# Patient Record
Sex: Female | Born: 1960 | Race: Asian | Hispanic: No | Marital: Married | Smoking: Former smoker
Health system: Southern US, Community
[De-identification: ages and names within clinical notes are randomized; demographics above are authoritative.]

## PROBLEM LIST (undated history)

## (undated) DIAGNOSIS — G8114 Spastic hemiplegia affecting left nondominant side: Secondary | ICD-10-CM

## (undated) DIAGNOSIS — D126 Benign neoplasm of colon, unspecified: Secondary | ICD-10-CM

## (undated) DIAGNOSIS — D509 Iron deficiency anemia, unspecified: Secondary | ICD-10-CM

## (undated) DIAGNOSIS — I10 Essential (primary) hypertension: Secondary | ICD-10-CM

## (undated) DIAGNOSIS — Z550 Illiteracy and low-level literacy: Secondary | ICD-10-CM

## (undated) DIAGNOSIS — Z87442 Personal history of urinary calculi: Secondary | ICD-10-CM

## (undated) DIAGNOSIS — D582 Other hemoglobinopathies: Secondary | ICD-10-CM

## (undated) DIAGNOSIS — I619 Nontraumatic intracerebral hemorrhage, unspecified: Secondary | ICD-10-CM

## (undated) DIAGNOSIS — Z86010 Personal history of colonic polyps: Principal | ICD-10-CM

## (undated) DIAGNOSIS — N200 Calculus of kidney: Secondary | ICD-10-CM

## (undated) DIAGNOSIS — I639 Cerebral infarction, unspecified: Secondary | ICD-10-CM

## (undated) HISTORY — DX: Calculus of kidney: N20.0

## (undated) HISTORY — DX: Iron deficiency anemia, unspecified: D50.9

## (undated) HISTORY — DX: Nontraumatic intracerebral hemorrhage, unspecified: I61.9

## (undated) HISTORY — DX: Cerebral infarction, unspecified: I63.9

## (undated) HISTORY — DX: Spastic hemiplegia affecting left nondominant side: G81.14

## (undated) HISTORY — DX: Essential (primary) hypertension: I10

## (undated) HISTORY — DX: Other hemoglobinopathies: D58.2

## (undated) HISTORY — DX: Personal history of colonic polyps: Z86.010

## (undated) HISTORY — DX: Benign neoplasm of colon, unspecified: D12.6

---

## 2004-11-20 ENCOUNTER — Emergency Department (HOSPITAL_COMMUNITY): Admission: EM | Admit: 2004-11-20 | Discharge: 2004-11-20 | Payer: Self-pay | Admitting: Emergency Medicine

## 2007-02-06 DIAGNOSIS — I619 Nontraumatic intracerebral hemorrhage, unspecified: Secondary | ICD-10-CM

## 2007-02-06 HISTORY — DX: Nontraumatic intracerebral hemorrhage, unspecified: I61.9

## 2007-04-20 ENCOUNTER — Inpatient Hospital Stay (HOSPITAL_COMMUNITY): Admission: EM | Admit: 2007-04-20 | Discharge: 2007-05-02 | Payer: Self-pay | Admitting: Emergency Medicine

## 2007-04-22 ENCOUNTER — Ambulatory Visit: Payer: Self-pay | Admitting: Physical Medicine & Rehabilitation

## 2010-06-20 NOTE — H&P (Signed)
NAMEMARIJAH, Strickland                   ACCOUNT NO.:  [id number]   MEDICAL RECORD NO.:  [id number]          PATIENT TYPE:  EMS   LOCATION:  MAJO                         FACILITY:  [facility]   PHYSICIAN:  Pramod P. Pearlean Brownie, MD    DATE OF BIRTH:  01-Oct-1960   DATE OF ADMISSION:  04/20/2007  DATE OF DISCHARGE:                              HISTORY & PHYSICAL   REASON FOR REFERRAL:  __________ stroke.   HISTORY OF PRESENT ILLNESS:  Ms. Strickland is a 50 year old [place] lady who developed sudden onset of headache with left leg  weakness at 11:30 a.m. today. The patient does not speak any English and  neither does her husband who is available at the bedsidewhen history was  obtained, after telephoning interpretation with Vietnamese-speaking  individual. The patient apparently was in a usual state of health until  this morning when her symptoms began suddenly. She has no prior history  of stroke, TIA, or significant known medical problems. The only  medications she takes are Aleve and Tylenol p.r.n. apparently for  headaches.   MEDICATION ALLERGIES:  None.   PAST MEDICAL HISTORY:  As mentioned above.   FAMILY HISTORY:  Not significant for anybody with strokes or other  neurological problems.   SOCIAL HISTORY:  The patient is married, lives with her husband. She  works full time. She is independent in activities of daily living. Does  not smoke or drink.   REVIEW OF SYSTEMS:  Not obtainable.   PHYSICAL EXAMINATION:  GENERAL:  Young Asian lady who is not in  distress. She is at present afebrile. Pulse rate is 100 per minute,  regular sinus.  Blood pressure 196/94. Respiratory rate 18 per minute.  Sats 96% on 2 liters.  HEAD:  Nontraumatic.  NECK:  Supple. There is no bruits.  ENT:  Unremarkable.  CARDIAC:  No murmur or gallop.  LUNGS:  Clear to auscultation.  ABDOMEN:  Soft, nontender.  NEUROLOGIC:  She is drowsy but does open eyes. She does follow commands.  She speaks in  [place] language. There is no slurred speech. She has  right gaze preference, cannot look to the left. She blinks to __________  on the right but not on the left. She has left lower facial weakness.  Tongue is midline. She has dense left hemiplegia with left upper  extremity being flaccid and 0/5 strength in the left lower extremity.  She does withdraw to some painful stimuli minimally. Both plantars are  downgoing.  Sensation and coordination cannot be tested.   DATA REVIEWED:  CT scan of the head noncontrast study done today reveals  4 x 3.2 x 3.2 cm right deep basal ganglia hemorrhage with slight right  to left midline shift. There is no intraventricular extension or edema  seen. Admission labs are pending at this time.   IMPRESSION:  A 50 year old lady with right deep basal ganglia  hemorrhage, likely due to hypertension.   PLAN:  The patient is admitted __________ hours and is at significant  risk for rebleed with hematoma expansion, edema, hydrocephalus,  intraventricular  extension. The patient may need mechanical ventilation  if she gets worse. I had a telephonic conversation with the patient's  husband by an interpreter explaining that she is critically ill and at  significant risk for worsening and is likely going to have major  disability from this hemorrhage. We will admit her to the intensive care  unit and monitor her blood pressures strictly and treat her with IV  labetalol p.r.n. and start Cardene drip and keep systolic blood pressure  120 to 160. Deep venous thrombosis and gastrointestinal prophylaxis.  Repeat CT scan of the head, noncontrast study, in six hours and MRI scan  tomorrow morning. I spent one hour of critical care time at the  patient's bedside as well as evaluating, treating, and monitoring her  care.           ______________________________  Sunny Schlein. Pearlean Brownie, MD     PPS/MEDQ  D:  04/20/2007  T:  04/20/2007  Job:  [id number]

## 2010-06-20 NOTE — Discharge Summary (Signed)
NAMEJIANNA, DRABIK                   ACCOUNT NO.:  [id number]   MEDICAL RECORD NO.:  [id number]          PATIENT TYPE:  INP   LOCATION:  3029                         FACILITY:  [facility]   PHYSICIAN:  Pramod P. Pearlean Brownie, MD    DATE OF BIRTH:  05-Jul-1960   DATE OF ADMISSION:  04/20/2007  DATE OF DISCHARGE:  05/01/2007                               DISCHARGE SUMMARY   DIAGNOSES AT THE TIME OF DISCHARGE:  1. Large right basal ganglia hypertensive hemorrhage.  2. Hypertension.   MEDICINES AT THE TIME OF DISCHARGE:  1. Hydrochlorothiazide 25 mg a day.  2. Altace 2.5 mg a day.  3. Protonix 40 mg a day.   STUDIES PERFORMED:  1. CT of the brain on admission showed a 4cm acute right basal ganglia      hemorrhage.  2. MRI of the brain showed large right basal ganglia hemorrhage and      associated surrounding edema and mass effect. This includes midline      shift to the left by 5.16mm and slightly low-lying cerebellar      tonsils. No thrombotic infarct. No intracranial mass seen.  3. MRA of the brain is motion degraded. A discrete vascular      malformation or aneurysm is not identified, though evaluation is      significantly limited.  4. Followup CT of the brain 4 hours later shows a slight interval      increase in the large acute hemorrhage in the right basal ganglia,      slight interval increase in right to left midline shift measuring      3mm compared to less than 1 on previous CT. No hydrocephalus.  5. Carotid Doppler not performed.  6. 2D echocardiogram not performed.  7. EKG not present in chart. Telemetry strip shows sinus rhythm.   LABORATORY STUDIES:  On admission, CBC with hemoglobin 12.5, hematocrit  39.6, white blood cells 11.8, and platelets 175. Chemistry with  potassium of 2.8, glucose 123. Liver function test normal.   HISTORY OF PRESENT ILLNESS:  Barbara Strickland is a 50 year old __________  lady  who developed sudden onset of headache with left leg weakness at 11:30  a.m. the  day of admission. The patient does not speak any English, and  neither does her husband. History was obtained after telephoning  interpretation with a Vietnamese-speaking individual. The patient was  apparently in her usual state of health until the morning of admission  when her symptoms began suddenly. She had no prior history of stroke,  TIA, or other medical problems. The only medicines she takes are Aleve  and Tylenol for headache. CT of the brain showed a 4x3.2x3.2 deep right  basal ganglia hemorrhage with slight right to left midline shift. She  was admitted to the ICU for further evaluation.   HOSPITAL COURSE:  Hemorrhage increased in size within the first 4 hours  without significant neurologic change. Hemorrhage was felt to be  secondary to hypertension for which she was started on  hydrochlorothiazide and Altace with a nice reduction in her blood  pressure. PT/OT and speech evaluated her and felt she would benefit from  inpatient rehab. The patient does not have any insurance benefits, and  therefore is unable to make that transfer. __________  Nursing facility  was sought out and now the patient is medically stable and ready for  discharge.   CONDITION AT DISCHARGE:  Patient awake, alert, follows simple commands  by gesture as she does not speak [place]. She has a dense left  hemiparesis 0/5 in her arm and leg. She has good strength on her right.  She has decreased sensation on her left. Her heart rate is regular. Her  breath sounds are clear.   DISCHARGE PLAN:  1. Discharged to skilled nursing facility for continuation of PT/OT      and speech therapy.  2. No antiplatelets at this time. Consider aspirin 325 mg a day 4      weeks from time of stroke.  3. Follow up with the physician  in nursing home.  4. Follow up Dr. Delia Heady in 2-3 months.      Annie Main, N.P.    ______________________________  Sunny Schlein. Pearlean Brownie, MD    SB/MEDQ  D:  05/01/2007  T:   05/01/2007  Job:  [id number]   cc:   Pramod P. Pearlean Brownie, MD  Facility for discharge

## 2010-10-30 LAB — DIFFERENTIAL
Eosinophils Relative: 3
Lymphocytes Relative: 16
Lymphs Abs: 1.8
Monocytes Absolute: 0.7
Monocytes Relative: 6

## 2010-10-30 LAB — COMPREHENSIVE METABOLIC PANEL
AST: 25
Albumin: 4
Calcium: 8.4
Chloride: 105
Creatinine, Ser: 0.93
GFR calc Af Amer: >60

## 2010-10-30 LAB — CBC
HCT: 39.6
Platelets: 175
WBC: 11.8 — ABNORMAL HIGH

## 2010-10-30 LAB — CK TOTAL AND CKMB (NOT AT ARMC)
CK, MB: 0.7
Relative Index: INVALID

## 2010-10-30 LAB — TROPONIN I: Troponin I: 0.01

## 2011-02-28 ENCOUNTER — Ambulatory Visit: Payer: Self-pay

## 2011-02-28 DIAGNOSIS — I10 Essential (primary) hypertension: Secondary | ICD-10-CM

## 2012-09-30 ENCOUNTER — Other Ambulatory Visit: Payer: Self-pay | Admitting: Family Medicine

## 2015-06-21 ENCOUNTER — Ambulatory Visit (INDEPENDENT_AMBULATORY_CARE_PROVIDER_SITE_OTHER): Payer: BLUE CROSS/BLUE SHIELD

## 2015-06-21 ENCOUNTER — Ambulatory Visit (INDEPENDENT_AMBULATORY_CARE_PROVIDER_SITE_OTHER): Payer: BLUE CROSS/BLUE SHIELD | Admitting: Emergency Medicine

## 2015-06-21 VITALS — BP 188/116 | HR 61 | Temp 98.1°F | Resp 16 | Ht 59.75 in | Wt 128.4 lb

## 2015-06-21 DIAGNOSIS — D509 Iron deficiency anemia, unspecified: Secondary | ICD-10-CM | POA: Diagnosis not present

## 2015-06-21 DIAGNOSIS — Z91199 Patient's noncompliance with other medical treatment and regimen due to unspecified reason: Secondary | ICD-10-CM | POA: Insufficient documentation

## 2015-06-21 DIAGNOSIS — Z9119 Patient's noncompliance with other medical treatment and regimen: Secondary | ICD-10-CM

## 2015-06-21 DIAGNOSIS — I619 Nontraumatic intracerebral hemorrhage, unspecified: Secondary | ICD-10-CM | POA: Insufficient documentation

## 2015-06-21 DIAGNOSIS — I10 Essential (primary) hypertension: Secondary | ICD-10-CM | POA: Insufficient documentation

## 2015-06-21 DIAGNOSIS — M25561 Pain in right knee: Secondary | ICD-10-CM

## 2015-06-21 DIAGNOSIS — G8114 Spastic hemiplegia affecting left nondominant side: Secondary | ICD-10-CM | POA: Diagnosis not present

## 2015-06-21 LAB — COMPLETE METABOLIC PANEL WITH GFR
ALT: 13 U/L (ref 6–29)
AST: 18 U/L (ref 10–35)
Albumin: 4 g/dL (ref 3.6–5.1)
Alkaline Phosphatase: 37 U/L (ref 33–130)
BUN: 18 mg/dL (ref 7–25)
CALCIUM: 8.4 mg/dL — AB (ref 8.6–10.4)
CHLORIDE: 107 mmol/L (ref 98–110)
CO2: 24 mmol/L (ref 20–31)
Creat: 0.83 mg/dL (ref 0.50–1.05)
GFR, Est Non African American: 79 mL/min (ref >=60)
Glucose, Bld: 95 mg/dL (ref 65–99)
POTASSIUM: 3.8 mmol/L (ref 3.5–5.3)
Sodium: 142 mmol/L (ref 135–146)
Total Bilirubin: 0.8 mg/dL (ref 0.2–1.2)
Total Protein: 7.3 g/dL (ref 6.1–8.1)

## 2015-06-21 LAB — POCT CBC
GRANULOCYTE PERCENT: 62.1 % (ref 37–80)
HEMATOCRIT: 36.9 % — AB (ref 37.7–47.9)
Hemoglobin: 12.2 g/dL (ref 12.2–16.2)
LYMPH, POC: 2.3 (ref 0.6–3.4)
MCH: 21.3 pg — AB (ref 27–31.2)
MCHC: 33.1 g/dL (ref 31.8–35.4)
MCV: 64.3 fL — AB (ref 80–97)
MID (cbc): 0.5 (ref 0–0.9)
MPV: 9 fL (ref 0–99.8)
POC Granulocyte: 4.5 (ref 2–6.9)
POC LYMPH PERCENT: 31.6 %L (ref 10–50)
POC MID %: 6.3 % (ref 0–12)
Platelet Count, POC: 131 10*3/uL — AB (ref 142–424)
RBC: 5.74 M/uL — AB (ref 4.04–5.48)
RDW, POC: 15.4 %
WBC: 7.3 10*3/uL (ref 4.6–10.2)

## 2015-06-21 LAB — IRON AND TIBC
%SAT: 11 % (ref 11–50)
IRON: 31 ug/dL — AB (ref 45–160)
TIBC: 285 ug/dL (ref 250–450)
UIBC: 254 ug/dL (ref 125–400)

## 2015-06-21 LAB — LIPID PANEL
CHOL/HDL RATIO: 2.1 ratio (ref <=5.0)
CHOLESTEROL: 235 mg/dL — AB (ref 125–200)
HDL: 114 mg/dL (ref >=46)
LDL CALC: 106 mg/dL (ref <130)
TRIGLYCERIDES: 73 mg/dL (ref <150)
VLDL: 15 mg/dL (ref <30)

## 2015-06-21 LAB — TSH: TSH: 1.17 m[IU]/L

## 2015-06-21 LAB — FERRITIN: Ferritin: 80 ng/mL (ref 10–232)

## 2015-06-21 LAB — POCT GLYCOSYLATED HEMOGLOBIN (HGB A1C): Hemoglobin A1C: 5.5

## 2015-06-21 MED ORDER — LOSARTAN POTASSIUM 25 MG PO TABS: 50.0000 mg | ORAL_TABLET | Freq: Every day | ORAL | Status: DC

## 2015-06-21 MED ORDER — AMLODIPINE BESYLATE 5 MG PO TABS: 5.0000 mg | ORAL_TABLET | Freq: Every day | ORAL | Status: DC

## 2015-06-21 NOTE — Patient Instructions (Addendum)
You can take Tylenol for your knee plain. You can apply an ice pack to use 5-10 minutes twice a day. Please get your blood pressure medications filled. I have made an appointment for you to see the neurologist and they will call you with the appointment time Please return to clinic in 7-10 days for repeat blood pressure check.DASH Eating Plan DASH stands for "Dietary Approaches to Stop Hypertension." The DASH eating plan is a healthy eating plan that has been shown to reduce high blood pressure (hypertension). Additional health benefits may include reducing the risk of type 2 diabetes mellitus, heart disease, and stroke. The DASH eating plan may also help with weight loss. WHAT DO I NEED TO KNOW ABOUT THE DASH EATING PLAN? For the DASH eating plan, you will follow these general guidelines:  Choose foods with a percent daily value for sodium of less than 5% (as listed on the food label).  Use salt-free seasonings or herbs instead of table salt or sea salt.  Check with your health care provider or pharmacist before using salt substitutes.  Eat lower-sodium products, often labeled as "lower sodium" or "no salt added."  Eat fresh foods.  Eat more vegetables, fruits, and low-fat dairy products.  Choose whole grains. Look for the word "whole" as the first word in the ingredient list.  Choose fish and skinless chicken or [place] more often than red meat. Limit fish, poultry, and meat to 6 oz (170 g) each day.  Limit sweets, desserts, sugars, and sugary drinks.  Choose heart-healthy fats.  Limit cheese to 1 oz (28 g) per day.  Eat more home-cooked food and less restaurant, buffet, and fast food.  Limit fried foods.  Cook foods using methods other than frying.  Limit canned vegetables. If you do use them, rinse them well to decrease the sodium.  When eating at a restaurant, ask that your food be prepared with less salt, or no salt if possible. WHAT FOODS CAN I EAT? Seek help from a  dietitian for individual calorie needs. Grains Whole grain or whole wheat bread. Brown rice. Whole grain or whole wheat pasta. Quinoa, bulgur, and whole grain cereals. Low-sodium cereals. Corn or whole wheat flour tortillas. Whole grain cornbread. Whole grain crackers. Low-sodium crackers. Vegetables Fresh or frozen vegetables (raw, steamed, roasted, or grilled). Low-sodium or reduced-sodium tomato and vegetable juices. Low-sodium or reduced-sodium tomato sauce and paste. Low-sodium or reduced-sodium canned vegetables.  Fruits All fresh, canned (in natural juice), or frozen fruits. Meat and Other Protein Products Ground beef (85% or leaner), grass-fed beef, or beef trimmed of fat. Skinless chicken or [place]. Ground chicken or [place]. Pork trimmed of fat. All fish and seafood. Eggs. Dried beans, peas, or lentils. Unsalted nuts and seeds. Unsalted canned beans. Dairy Low-fat dairy products, such as skim or 1% milk, 2% or reduced-fat cheeses, low-fat ricotta or cottage cheese, or plain low-fat yogurt. Low-sodium or reduced-sodium cheeses. Fats and Oils Tub margarines without trans fats. Light or reduced-fat mayonnaise and salad dressings (reduced sodium). Avocado. Safflower, olive, or canola oils. Natural peanut or almond butter. Other Unsalted popcorn and pretzels. The items listed above may not be a complete list of recommended foods or beverages. Contact your dietitian for more options. WHAT FOODS ARE NOT RECOMMENDED? Grains White bread. White pasta. White rice. Refined cornbread. Bagels and croissants. Crackers that contain trans fat. Vegetables Creamed or fried vegetables. Vegetables in a cheese sauce. Regular canned vegetables. Regular canned tomato sauce and paste. Regular tomato and vegetable juices. Fruits  Dried fruits. Canned fruit in light or heavy syrup. Fruit juice. Meat and Other Protein Products Fatty cuts of meat. Ribs, chicken wings, bacon, sausage, bologna, salami,  chitterlings, fatback, hot dogs, bratwurst, and packaged luncheon meats. Salted nuts and seeds. Canned beans with salt. Dairy Whole or 2% milk, cream, half-and-half, and cream cheese. Whole-fat or sweetened yogurt. Full-fat cheeses or blue cheese. Nondairy creamers and whipped toppings. Processed cheese, cheese spreads, or cheese curds. Condiments Onion and garlic salt, seasoned salt, table salt, and sea salt. Canned and packaged gravies. Worcestershire sauce. Tartar sauce. Barbecue sauce. Teriyaki sauce. Soy sauce, including reduced sodium. Steak sauce. Fish sauce. Oyster sauce. Cocktail sauce. Horseradish. Ketchup and mustard. Meat flavorings and tenderizers. Bouillon cubes. Hot sauce. Tabasco sauce. Marinades. Taco seasonings. Relishes. Fats and Oils Butter, stick margarine, lard, shortening, ghee, and bacon fat. Coconut, palm kernel, or palm oils. Regular salad dressings. Other Pickles and olives. Salted popcorn and pretzels. The items listed above may not be a complete list of foods and beverages to avoid. Contact your dietitian for more information. WHERE CAN I FIND MORE INFORMATION? National Heart, Lung, and Blood Institute: travelstabloid.com   This information is not intended to replace advice given to you by your health care provider. Make sure you discuss any questions you have with your health care provider.   Document Released: 01/11/2011 Document Revised: 02/12/2014 Document Reviewed: 11/26/2012 Elsevier Interactive Patient Education 2016 [employer]. .    IF you received an x-ray today, you will receive an invoice from [facility] Radiology. Please contact [facility] at [phone] with questions or concerns regarding your invoice.   IF you received labwork today, you will receive an invoice from [employer]. Please contact [facility] at [phone] with questions or concerns regarding your invoice.   Our  billing staff will not be able to assist you with questions regarding bills from these companies.  You will be contacted with the lab results as soon as they are available. The fastest way to get your results is to activate your My Chart account. Instructions are located on the last page of this paperwork. If you have not heard from [place] regarding the results in 2 weeks, please contact this office.

## 2015-06-21 NOTE — Progress Notes (Addendum)
Subjective:  This chart was scribed for Barbara Queen MD, by [name], at [facility].  This patient was seen in room 1 and the patient's care was started at 8:29 AM.   Chief Complaint  Patient presents with  . Knee Pain    right x 3 days     Patient ID: Barbara Strickland, female    DOB: 01/28/1961, 55 y.o.   MRN: [id number]  HPI HPI Comments: Barbara Strickland is a 55 y.o. female who presents to the [facility] complaining of right knee pain after falling three days age while getting out of the shower.  She is unable to straighten her leg fully and her daughter has been messaging it since the incident.   Patient has associated symptoms of back pain.  Per daughter, the floor was slippery and thinks this is why she fell. Denies a headache or chest pain.   Patient had a stroke in 2009 (acute hemorrhage in her right basal ganglia) and her daughter states that the left side of her body has been numb since then. She has not yet seen a doctor regarding her stroke.  She stayed in a nursing home for a week afterwards but never went to a doctor for a  follow up.  Patient does not take any blood thinners. She is willing to see a doctor regarding her stroke once a referral is made.    Blood Pressure: Patient's daughter states that her mother has always had high blood pressure. She "thinks" that she was given blood pressure medication in 2009 or 2011, but is unsure. She is not currently on any medication.  Patient is from [place] and her daughter is interpreting for her today.   There are no active problems to display for this patient.  No past medical history on file. No past surgical history on file. No Known Allergies Prior to Admission medications   Not on File   Social History   Social History  . Marital Status: Married    Spouse Name: N/A  . Number of Children: N/A  . Years of Education: N/A   Occupational History  . Not on file.   Social History  Main Topics  . Smoking status: Former Smoker -- 3 years    Types: Cigarettes  . Smokeless tobacco: Not on file  . Alcohol Use: No  . Drug Use: Not on file  . Sexual Activity: Not on file   Other Topics Concern  . Not on file   Social History Narrative  . No narrative on file       Review of Systems  Constitutional: Negative for fever and chills.  Eyes: Negative for pain, redness and itching.  Respiratory: Negative for cough, choking and shortness of breath.   Cardiovascular: Negative for chest pain.  Gastrointestinal: Negative for nausea and vomiting.  Musculoskeletal: Positive for back pain and gait problem. Negative for neck pain and neck stiffness.  Neurological: Positive for numbness. Negative for syncope, speech difficulty and headaches.       Objective:   Physical Exam Filed Vitals:   06/21/15 0819 06/21/15 0827  BP: 200/120 188/116  Pulse: 61   Temp: 98.1 F (36.7 C)   TempSrc: Oral   Resp: 16   Height: 4' 11.75" (1.518 m)   Weight: 128 lb 6.4 oz (58.242 kg)   SpO2: 97%     CONSTITUTIONAL: Well developed/well nourished HEAD: A left facial paralysis EYES: EOMI/PERRL ENMT: Mucous membranes moist  NECK: supple no meningeal signs CV: S1/S2 noted, no murmurs/rubs/gallops noted LUNGS: Lungs are clear to auscultation bilaterally, no apparent distress NEURO: Pt is awake/alert/appropriate, moves all extremitiesx4.  No facial droop.   EXTREMITIES: Spastic left arm, Weakness on the left leg, Tenderness around the right knee with decreased range of motion.   Dg Knee Complete 4 Views Right  06/21/2015  CLINICAL DATA:  Acute pain.  Contusion. EXAM: RIGHT KNEE - COMPLETE 4+ VIEW COMPARISON:  No prior. FINDINGS: No acute bony abnormality identified. No evidence of fracture dislocation. Small knee joint effusion. IMPRESSION: 1. No acute bony abnormality. 2. Small knee joint effusion. Electronically Signed   By: Marcello Moores  Register   On: 06/21/2015 09:32   Results for orders  placed or performed in visit on 06/21/15  POCT CBC  Result Value Ref Range   WBC 7.3 4.6 - 10.2 K/uL   Lymph, poc 2.3 0.6 - 3.4   POC LYMPH PERCENT 31.6 10 - 50 %L   MID (cbc) 0.5 0 - 0.9   POC MID % 6.3 0 - 12 %M   POC Granulocyte 4.5 2 - 6.9   Granulocyte percent 62.1 37 - 80 %G   RBC 5.74 (A) 4.04 - 5.48 M/uL   Hemoglobin 12.2 12.2 - 16.2 g/dL   HCT, POC 36.9 (A) 37.7 - 47.9 %   MCV 64.3 (A) 80 - 97 fL   MCH, POC 21.3 (A) 27 - 31.2 pg   MCHC 33.1 31.8 - 35.4 g/dL   RDW, POC 15.4 %   Platelet Count, POC 131 (A) 142 - 424 K/uL   MPV 9.0 0 - 99.8 fL  POCT glycosylated hemoglobin (Hb A1C)  Result Value Ref Range   Hemoglobin A1C 5.5        Assessment & Plan:  Patient will start on amlodipine 5 mg. She was also given losartan 25 mg.She has not seen a doctor since 2011 when she was under treatment for hemorrhagic stroke in the basal ganglia. She has not been on any medication or had any follow-up since that admission. She will need blood pressure recheck in one week. Referral made to Dr. Leonie Man for follow-up of her stroke. 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 She will be instructed to take Tylenol for pain.I personally performed the services described in this documentation, which was scribed in my presence. The recorded information has been reviewed and is accurate.  Darlyne Russian, MD

## 2015-06-22 ENCOUNTER — Encounter: Payer: Self-pay | Admitting: Internal Medicine

## 2015-06-22 ENCOUNTER — Other Ambulatory Visit: Payer: Self-pay | Admitting: Emergency Medicine

## 2015-06-22 DIAGNOSIS — D509 Iron deficiency anemia, unspecified: Secondary | ICD-10-CM

## 2015-06-30 ENCOUNTER — Encounter: Payer: Self-pay | Admitting: *Deleted

## 2015-07-07 ENCOUNTER — Telehealth: Payer: Self-pay | Admitting: *Deleted

## 2015-07-07 ENCOUNTER — Ambulatory Visit: Payer: BLUE CROSS/BLUE SHIELD | Admitting: Neurology

## 2015-07-07 NOTE — Telephone Encounter (Signed)
no showed NP appt 

## 2015-07-08 ENCOUNTER — Encounter: Payer: Self-pay | Admitting: Neurology

## 2015-08-23 ENCOUNTER — Ambulatory Visit (INDEPENDENT_AMBULATORY_CARE_PROVIDER_SITE_OTHER): Payer: BLUE CROSS/BLUE SHIELD | Admitting: Internal Medicine

## 2015-08-23 ENCOUNTER — Encounter: Payer: Self-pay | Admitting: Internal Medicine

## 2015-08-23 VITALS — BP 174/110 | HR 60 | Ht 58.25 in | Wt 127.0 lb

## 2015-08-23 DIAGNOSIS — Z9119 Patient's noncompliance with other medical treatment and regimen: Secondary | ICD-10-CM | POA: Diagnosis not present

## 2015-08-23 DIAGNOSIS — I10 Essential (primary) hypertension: Secondary | ICD-10-CM | POA: Diagnosis not present

## 2015-08-23 DIAGNOSIS — R718 Other abnormality of red blood cells: Secondary | ICD-10-CM

## 2015-08-23 DIAGNOSIS — Z91199 Patient's noncompliance with other medical treatment and regimen due to unspecified reason: Secondary | ICD-10-CM

## 2015-08-23 DIAGNOSIS — Z1211 Encounter for screening for malignant neoplasm of colon: Secondary | ICD-10-CM | POA: Diagnosis not present
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
## 2015-08-23 NOTE — Patient Instructions (Addendum)
   Please get the blood pressure medications and restart today. You are at risk of another stroke, heart attacks and death if you do not take them.  You may call back to schedule a screening colonoscopy.  I appreciate the opportunity to care for you. Gatha Mayer, MD, [facility]   Colorectal Cancer Screening Colorectal cancer screening is a group of tests used to check for colorectal cancer. Colorectal refers to your colon and rectum. Your colon and rectum are located at the end of your large intestine and carry your bowel movements out of your body.  WHY IS COLORECTAL CANCER SCREENING DONE? It is common for abnormal growths (polyps) to form in the lining of your colon, especially as you get older. These polyps can be cancerous or become cancerous. If colorectal cancer is found at an early stage, it is treatable. WHO SHOULD BE SCREENED FOR COLORECTAL CANCER? Screening is recommended for all adults at average risk starting at age 42. Tests may be recommended every 1 to 10 years. Your health care provider may recommend earlier or more frequent screening if you have:  A history of colorectal cancer or polyps.  A family member with a history of colorectal cancer or polyps.  Inflammatory bowel disease, such as ulcerative colitis or Crohn disease.  A type of hereditary colon cancer syndrome.  Colorectal cancer symptoms. TYPES OF SCREENING TESTS There are several types of colorectal screening tests. They include:  Guaiac-based fecal occult blood testing.  Fecal immunochemical test (FIT).  Stool DNA test.  Barium enema.  Virtual colonoscopy.  Sigmoidoscopy. During this test, a sigmoidoscope is used to examine your rectum and lower colon. A sigmoidoscope is a flexible tube with a camera that is inserted through your anus into your rectum and lower colon.  Colonoscopy. During this test, a colonoscope is used to examine your entire colon. A colonoscope is a long, thin, flexible tube  with a camera. This test examines your entire colon and rectum.   This information is not intended to replace advice given to you by your health care provider. Make sure you discuss any questions you have with your health care provider.   Document Released: 07/12/2009 Document Revised: 02/12/2014 Document Reviewed: 04/30/2013 Elsevier Interactive Patient Education [employer].

## 2016-05-15 ENCOUNTER — Ambulatory Visit (INDEPENDENT_AMBULATORY_CARE_PROVIDER_SITE_OTHER): Payer: BLUE CROSS/BLUE SHIELD | Admitting: Physician Assistant

## 2016-05-15 VITALS — BP 140/100 | HR 61 | Temp 97.4°F | Resp 17 | Ht 59.5 in | Wt 126.0 lb

## 2016-05-15 DIAGNOSIS — M7989 Other specified soft tissue disorders: Secondary | ICD-10-CM | POA: Diagnosis not present

## 2016-05-15 NOTE — Progress Notes (Signed)
PRIMARY CARE AT [place], [place] [address] [phone]  Date:  05/15/2016   Name:  Barbara Strickland   DOB:  26-Jan-1961   MRN:  [id number]  PCP:  No PCP Per Patient    History of Present Illness:  Barbara Strickland is a 56 y.o. female patient who presents to PCP with  Chief Complaint  Patient presents with  . fell and has bump on head     Patient is here today for cc of bump on head.  Patient is here with individual to intepret [place].   She reports that she is concerned of swelling that started in head 2008.  She fell secondary to a hemorrhagic stroke.  A bump grew on head and has been prominent since that time.  They are concerned of infection because it is there.  The bump has not grown for years.  No tenderness.  No redness, no fever.  No drainage.    Patient Active Problem List   Diagnosis Date Noted  . 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 Essential hypertension 06/21/2015  . Medical non-compliance 06/21/2015    Past Medical History:  Diagnosis Date  . 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 Hemorrhagic stroke ([place])   . Hypertension   . Left spastic hemiparesis ([place])   . Microcytic anemia     No past surgical history on file.  Social History  Substance Use Topics  . Smoking status: Former Smoker    Years: 3.00    Types: Cigarettes    Quit date: 02/06/2003  . Smokeless tobacco: Never Used  . Alcohol use No    Family History  Problem Relation Age of Onset  . Family history unknown: Yes    No Known Allergies  Medication list has been reviewed and updated.  Current Outpatient Prescriptions on File Prior to Visit  Medication Sig Dispense Refill  . amLODipine (NORVASC) 5 MG tablet Take 1 tablet (5 mg total) by mouth daily. 30 tablet 11  . losartan (COZAAR) 25 MG tablet Take 2 tablets (50 mg total) by mouth daily. 30 tablet 11   No current facility-administered medications on file prior to visit.     ROS ROS otherwise unremarkable unless listed  above.  Physical Examination: BP (!) 174/99   Pulse 61   Temp 97.4 F (36.3 C) (Oral)   Resp 17   Ht 4' 11.5" (1.511 m)   Wt 126 lb (57.2 kg)   SpO2 97%   BMI 25.02 kg/m  Ideal Body Weight: Weight in (lb) to have BMI = 25: 125.6  Physical Exam  Constitutional: She is oriented to person, place, and time. She appears well-developed and well-nourished. No distress.  HENT:  Head: Normocephalic and atraumatic.  Right Ear: External ear normal.  Left Ear: External ear normal.  Eyes: Conjunctivae and EOM are normal. Pupils are equal, round, and reactive to light.  Cardiovascular: Normal rate and regular rhythm.   Pulmonary/Chest: Effort normal. No respiratory distress.  Neurological: She is alert and oriented to person, place, and time.  Skin: She is not diaphoretic.  Right rubbery mobile nodule at the right side of scalp.  Non-tender.  No erythema, driange or scabbing.     Psychiatric: She has a normal mood and affect. Her behavior is normal.     Assessment and Plan: Barbara Strickland is a 56 y.o. female who is here today for cc of rubbery nodule at the scalp Advised that this is benign.  Very cyst like.  Recommended imaging.  It would likely be an ultrasound. Nodule of soft tissue  Barbara Drape, PA-C [facility] 4/10/201810:39 AM

## 2016-05-15 NOTE — Patient Instructions (Signed)
     IF you received an x-ray today, you will receive an invoice from [facility]. Please contact [facility] at [phone] with questions or concerns regarding your invoice.   IF you received labwork today, you will receive an invoice from [facility]. Please contact [facility] at [phone] with questions or concerns regarding your invoice.   Our billing staff will not be able to assist you with questions regarding bills from these companies.  You will be contacted with the lab results as soon as they are available. The fastest way to get your results is to activate your My Chart account. Instructions are located on the last page of this paperwork. If you have not heard from us regarding the results in 2 weeks, please contact this office.     

## 2016-07-22 ENCOUNTER — Other Ambulatory Visit: Payer: Self-pay | Admitting: Emergency Medicine

## 2016-07-22 DIAGNOSIS — I10 Essential (primary) hypertension: Secondary | ICD-10-CM

## 2016-08-01 ENCOUNTER — Encounter: Payer: Self-pay | Admitting: Physician Assistant

## 2016-08-01 ENCOUNTER — Ambulatory Visit (INDEPENDENT_AMBULATORY_CARE_PROVIDER_SITE_OTHER): Payer: BLUE CROSS/BLUE SHIELD

## 2016-08-01 ENCOUNTER — Ambulatory Visit (INDEPENDENT_AMBULATORY_CARE_PROVIDER_SITE_OTHER): Payer: BLUE CROSS/BLUE SHIELD | Admitting: Physician Assistant

## 2016-08-01 VITALS — BP 162/93 | HR 60 | Temp 98.2°F | Resp 16 | Ht 59.5 in | Wt 127.2 lb

## 2016-08-01 DIAGNOSIS — E7800 Pure hypercholesterolemia, unspecified: Secondary | ICD-10-CM

## 2016-08-01 DIAGNOSIS — R109 Unspecified abdominal pain: Secondary | ICD-10-CM

## 2016-08-01 DIAGNOSIS — D509 Iron deficiency anemia, unspecified: Secondary | ICD-10-CM | POA: Diagnosis not present

## 2016-08-01 DIAGNOSIS — K5900 Constipation, unspecified: Secondary | ICD-10-CM

## 2016-08-01 DIAGNOSIS — Z1231 Encounter for screening mammogram for malignant neoplasm of breast: Secondary | ICD-10-CM | POA: Diagnosis not present

## 2016-08-01 DIAGNOSIS — N200 Calculus of kidney: Secondary | ICD-10-CM

## 2016-08-01 DIAGNOSIS — I10 Essential (primary) hypertension: Secondary | ICD-10-CM

## 2016-08-01 DIAGNOSIS — Z1211 Encounter for screening for malignant neoplasm of colon: Secondary | ICD-10-CM

## 2016-08-01 LAB — CMP14+EGFR
ALBUMIN: 4.1 g/dL (ref 3.5–5.5)
ALT: 16 IU/L (ref 0–32)
AST: 22 IU/L (ref 0–40)
Albumin/Globulin Ratio: 1.4 (ref 1.2–2.2)
Alkaline Phosphatase: 50 IU/L (ref 39–117)
BILIRUBIN TOTAL: 0.6 mg/dL (ref 0.0–1.2)
BUN / CREAT RATIO: 19 (ref 9–23)
BUN: 19 mg/dL (ref 6–24)
CALCIUM: 8.9 mg/dL (ref 8.7–10.2)
CHLORIDE: 106 mmol/L (ref 96–106)
CO2: 23 mmol/L (ref 20–29)
CREATININE: 0.99 mg/dL (ref 0.57–1.00)
GFR calc non Af Amer: 63 mL/min/{1.73_m2} (ref >59)
GFR, EST AFRICAN AMERICAN: 73 mL/min/{1.73_m2} (ref >59)
GLUCOSE: 82 mg/dL (ref 65–99)
Globulin, Total: 3 g/dL (ref 1.5–4.5)
Potassium: 4 mmol/L (ref 3.5–5.2)
Sodium: 144 mmol/L (ref 134–144)
TOTAL PROTEIN: 7.1 g/dL (ref 6.0–8.5)

## 2016-08-01 LAB — CBC WITH DIFFERENTIAL/PLATELET
BASOS: 1 %
Basophils Absolute: 0 10*3/uL (ref 0.0–0.2)
EOS (ABSOLUTE): 0.1 10*3/uL (ref 0.0–0.4)
EOS: 2 %
HEMATOCRIT: 35.7 % (ref 34.0–46.6)
Hemoglobin: 11.2 g/dL (ref 11.1–15.9)
Immature Grans (Abs): 0 10*3/uL (ref 0.0–0.1)
Immature Granulocytes: 0 %
LYMPHS ABS: 2.2 10*3/uL (ref 0.7–3.1)
Lymphs: 38 %
MCH: 20.9 pg — ABNORMAL LOW (ref 26.6–33.0)
MCHC: 31.4 g/dL — AB (ref 31.5–35.7)
MCV: 67 fL — AB (ref 79–97)
MONOS ABS: 0.3 10*3/uL (ref 0.1–0.9)
Monocytes: 6 %
Neutrophils Absolute: 3 10*3/uL (ref 1.4–7.0)
Neutrophils: 53 %
PLATELETS: 170 10*3/uL (ref 150–379)
RBC: 5.35 x10E6/uL — AB (ref 3.77–5.28)
RDW: 17.1 % — AB (ref 12.3–15.4)
WBC: 5.6 10*3/uL (ref 3.4–10.8)

## 2016-08-01 LAB — LIPID PANEL
Chol/HDL Ratio: 2.5 ratio (ref 0.0–4.4)
Cholesterol, Total: 255 mg/dL — ABNORMAL HIGH (ref 100–199)
HDL: 102 mg/dL (ref >39)
LDL CALC: 139 mg/dL — AB (ref 0–99)
Triglycerides: 68 mg/dL (ref 0–149)
VLDL CHOLESTEROL CAL: 14 mg/dL (ref 5–40)

## 2016-08-01 LAB — POC HEMOCCULT BLD/STL (OFFICE/1-CARD/DIAGNOSTIC): Fecal Occult Blood, POC: NEGATIVE

## 2016-08-01 MED ORDER — AMLODIPINE BESYLATE 5 MG PO TABS: 5.0000 mg | ORAL_TABLET | Freq: Every day | ORAL | 1 refills | Status: DC

## 2016-08-01 MED ORDER — POLYETHYLENE GLYCOL 3350 17 GM/SCOOP PO POWD: 17.0000 g | Freq: Every day | ORAL | 1 refills | Status: DC

## 2016-08-01 MED ORDER — DOCUSATE SODIUM 100 MG PO CAPS
300.0000 mg | ORAL_CAPSULE | Freq: Every day | ORAL | 0 refills | Status: DC
Start: 2016-08-01 — End: 2017-07-10

## 2016-08-01 MED ORDER — LOSARTAN POTASSIUM 50 MG PO TABS: 50.0000 mg | ORAL_TABLET | Freq: Every day | ORAL | 1 refills | Status: DC

## 2016-08-01 NOTE — Patient Instructions (Signed)
Try to drink more water. Keep taking your BP medications - recheck with me in 6 months  Use the medication for constipation for at least 3 weeks and then use as needed.  The medications will make your stool soft and let you go to the bathroom more easily.

## 2016-08-01 NOTE — Progress Notes (Signed)
Barbara Strickland  MRN: [id number] DOB: 03/31/1960  PCP: Patient, No Pcp Per  Chief Complaint  Patient presents with  . Breast Pain    Left side pain under breast x 2 week   . Medication Refill    Losartan, amlodipine    Subjective:  Pt presents to clinic for medication refill - she has been out for 2 weeks.  She does not check her BP at home.   She had a stroke in 2008 which left her numb on her left side - about 2 weeks ago she started to notice a pain on her left breast which is intermittent - she has the pain when she moves/changes position. She has always a a larger left breast. No nipple discharge.  She has done nothing to help.  She has had problems with hard stool over the last several weeks.  No diarrhea.  Pt's pain is left lateral abdomen.  No N/V.  She has never had a mammogram.  Pt with daughter today who helps with interpretation.  Review of Systems  Constitutional: Negative for chills and fever.  Respiratory: Negative for shortness of breath.   Cardiovascular: Negative for chest pain, palpitations and leg swelling.  Gastrointestinal: Positive for abdominal pain and constipation. Negative for diarrhea, nausea and vomiting.  Neurological: Negative for headaches.    Patient Active Problem List   Diagnosis Date Noted  . Hemorrhagic stroke ([place]) 06/21/2015  . Essential hypertension 06/21/2015  . Medical non-compliance 06/21/2015    No current outpatient prescriptions on file prior to visit.   No current facility-administered medications on file prior to visit.     No Known Allergies  Pt patients past, family and social history were reviewed and updated.   Objective:  BP (!) 162/93   Pulse 60   Temp 98.2 F (36.8 C) (Oral)   Resp 16   Ht 4' 11.5" (1.511 m)   Wt 127 lb 3.2 oz (57.7 kg)   SpO2 98%   BMI 25.26 kg/m   Physical Exam  Constitutional: She is oriented to person, place, and time and well-developed, well-nourished, and in no distress.  HENT:    Head: Normocephalic and atraumatic.  Right Ear: Hearing and external ear normal.  Left Ear: Hearing and external ear normal.  Eyes: Conjunctivae are normal.  Neck: Normal range of motion.  Cardiovascular: Normal rate, regular rhythm and normal heart sounds.   No murmur heard. Pulmonary/Chest: Effort normal and breath sounds normal. She has no wheezes. Right breast exhibits no inverted nipple, no mass, no nipple discharge, no skin change and no tenderness. Left breast exhibits no inverted nipple, no mass, no nipple discharge, no skin change and no tenderness. Breasts are symmetrical.  Left breast feels more dense than the right but her muscles are tighter on that side due to some residual contractures from previous stroke.  Abdominal: There is tenderness (left lateral abd). There is no rigidity, no rebound, no guarding, no CVA tenderness, no tenderness at McBurney's point and negative Murphy's sign.  Neurological: She is alert and oriented to person, place, and time. Gait normal.  Skin: Skin is warm and dry.  Psychiatric: Mood, memory, affect and judgment normal.  Vitals reviewed.  Dg Abd Acute W/chest  Result Date: 08/01/2016 CLINICAL DATA:  Abdominal pain for 2 weeks EXAM: DG ABDOMEN ACUTE W/ 1V CHEST COMPARISON:  None. FINDINGS: Cardiac shadow is enlarged. The lungs are well aerated bilaterally. No focal infiltrate is seen. The abdomen shows a nonobstructive bowel gas pattern.  Diffuse scattered air-fluid levels are noted. Fecal material is noted throughout the colon consistent with a degree of constipation. A a staghorn calculus is noted on the right. No acute bony abnormality is seen. IMPRESSION: Changes consistent with constipation. Right staghorn calculus. Electronically Signed   By: Inez Catalina M.D.   On: 08/01/2016 09:14   Results for orders placed or performed in visit on 08/01/16  POC Hemoccult Bld/Stl (1-Cd Office Dx)  Result Value Ref Range   Card #1 Date 08/01/2016    Fecal  Occult Blood, POC Negative Negative    Assessment and Plan :  Iron deficiency anemia, unspecified iron deficiency anemia type - Plan: CBC with Differential/Platelet, POC Hemoccult Bld/Stl (1-Cd Office Dx), CANCELED: Ambulatory referral to Gastroenterology - neg stool for blood today - she does not want to go to gastroenterology but we will do cologuard to screen for colon cancer  Elevated cholesterol - Plan: Lipid panel - this has been elevated in the past and we will recheck today  Essential hypertension - Plan: CMP14+EGFR, EKG 12-Lead, Care order/instruction:, amLODipine (NORVASC) 5 MG tablet, losartan (COZAAR) 50 MG tablet - restart medications  Left lateral abdominal pain - Plan: DG Abd Acute W/Chest, POC Hemoccult Bld/Stl (1-Cd Office Dx)  Visit for screening mammogram - Plan: MM Digital Screening  Screen for colon cancer - Plan: Cologuard, CANCELED: Ambulatory referral to Gastroenterology - pt would like to do cologuard - we discussed the possibility of needing colonoscopy anyway but she would like to proceed this way for now  Constipation, unspecified constipation type - Plan: docusate sodium (COLACE) 100 MG capsule, polyethylene glycol powder (GLYCOLAX/MIRALAX) powder - increase fluids and fiber - use this to help with current pain and constipation.  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  Windell Hummingbird PA-C  [facility] 08/01/2016 2:41 PM

## 2017-02-25 ENCOUNTER — Other Ambulatory Visit: Payer: Self-pay | Admitting: Physician Assistant

## 2017-02-25 DIAGNOSIS — I10 Essential (primary) hypertension: Secondary | ICD-10-CM

## 2017-03-25 ENCOUNTER — Other Ambulatory Visit: Payer: Self-pay | Admitting: Physician Assistant

## 2017-03-25 DIAGNOSIS — I10 Essential (primary) hypertension: Secondary | ICD-10-CM

## 2017-03-26 NOTE — Telephone Encounter (Signed)
Losartan and Amlodipine refill request Last OV 08/01/16 [facility] [address], [place]

## 2017-03-26 NOTE — Telephone Encounter (Signed)
Refill request for amlodipine  5 mg and losrtan 50 mg denied.  amlodopine refilled on 02/26/17 #30 w/no refills- pt needs appt for further refills. Dgaddy, CMA

## 2017-05-07 ENCOUNTER — Encounter: Payer: Self-pay | Admitting: Physician Assistant

## 2017-05-15 ENCOUNTER — Ambulatory Visit: Payer: BLUE CROSS/BLUE SHIELD | Admitting: Family Medicine

## 2017-05-15 ENCOUNTER — Other Ambulatory Visit: Payer: Self-pay

## 2017-05-15 ENCOUNTER — Encounter: Payer: Self-pay | Admitting: Family Medicine

## 2017-05-15 VITALS — BP 166/115 | HR 65 | Temp 98.2°F | Resp 16 | Ht 59.5 in | Wt 127.2 lb

## 2017-05-15 DIAGNOSIS — R358 Other polyuria: Secondary | ICD-10-CM | POA: Diagnosis not present

## 2017-05-15 DIAGNOSIS — Z131 Encounter for screening for diabetes mellitus: Secondary | ICD-10-CM

## 2017-05-15 DIAGNOSIS — Z23 Encounter for immunization: Secondary | ICD-10-CM

## 2017-05-15 DIAGNOSIS — R3589 Other polyuria: Secondary | ICD-10-CM

## 2017-05-15 DIAGNOSIS — Z1211 Encounter for screening for malignant neoplasm of colon: Secondary | ICD-10-CM

## 2017-05-15 DIAGNOSIS — I10 Essential (primary) hypertension: Secondary | ICD-10-CM

## 2017-05-15 DIAGNOSIS — D509 Iron deficiency anemia, unspecified: Secondary | ICD-10-CM | POA: Diagnosis not present

## 2017-05-15 DIAGNOSIS — Z13220 Encounter for screening for lipoid disorders: Secondary | ICD-10-CM | POA: Diagnosis not present

## 2017-05-15 DIAGNOSIS — I619 Nontraumatic intracerebral hemorrhage, unspecified: Secondary | ICD-10-CM

## 2017-05-15 DIAGNOSIS — G8114 Spastic hemiplegia affecting left nondominant side: Secondary | ICD-10-CM | POA: Diagnosis not present

## 2017-05-15 DIAGNOSIS — E7800 Pure hypercholesterolemia, unspecified: Secondary | ICD-10-CM

## 2017-05-15 MED ORDER — LOSARTAN POTASSIUM 50 MG PO TABS: 50.0000 mg | ORAL_TABLET | Freq: Every day | ORAL | 1 refills | Status: DC

## 2017-05-15 MED ORDER — AMLODIPINE BESYLATE 5 MG PO TABS: ORAL_TABLET | ORAL | 1 refills | Status: DC

## 2017-05-15 NOTE — Patient Instructions (Addendum)
   IF you received an x-ray today, you will receive an invoice from [facility]. Please contact [facility] at [phone] with questions or concerns regarding your invoice.   IF you received labwork today, you will receive an invoice from [facility]. Please contact [facility] at [phone] with questions or concerns regarding your invoice.   Our billing staff will not be able to assist you with questions regarding bills from these companies.  You will be contacted with the lab results as soon as they are available. The fastest way to get your results is to activate your My Chart account. Instructions are located on the last page of this paperwork. If you have not heard from us regarding the results in 2 weeks, please contact this office.     Cancer Screening for Women A cancer screening is a test or exam that checks for cancer. Your health care provider will recommend specific cancer screenings based on your age, personal history, and family history of cancer. Work with your health care provider to create a cancer screening schedule that protects your health. Why is cancer screening done? Cancer screening is done to look for cancer in the very early stages, before it spreads and becomes harder to treat and before you would start to notice symptoms. Finding cancer early improves the chances of successful treatment. It may save your life. Who should be screened for cancer? All women should be screened for certain cancers, including breast cancer, cervical cancer, and skin cancer. Your health care provider may recommend screenings for other types of cancer if:  You had cancer before.  You have a family member with cancer.  You have abnormal genes that could increase the risk of cancer.  You have risk factors for certain cancers, such as smoking.  When you should be screened for cancer depends on:  Your age.  Your medical history and your family's medical  history.  Certain lifestyle factors, such as smoking.  Environmental exposure, such as to asbestos.  What are some common cancer screenings? Breast cancer Breast cancer screening is done with a test that takes images of breast tissue (mammogram). Here are some screening guidelines:  When you are age 40-44. you will be given the choice to start having mammograms.  When you are age 45-54, you should have a mammogram every year.  You may start having mammograms before age 45 if you have risk factors for breast cancer, such as having an immediate family member with breast cancer.  When you are age 55 or older, you should have a mammogram every 1-2 years for as long as you are in good health and have a life expectancy of 10 years or more.  It is important to know what your breasts look and feel like so you can report any changes to your health care provider.  Cervical cancer Cervical cancer screening is done with a Pap test. This testchecks for abnormalities, including the virus that causes cervical cancer (human papillomavirus, or HPV). To perform the test, a health care provider takes a swab of cervical cells during a pelvic exam. Screening for cervical cancer with a Pap test should start at age 21. Here are some screening guidelines:  When you are age 21-29, you should have a Pap test every 3 years.  When you are age 30-65, you should have a Pap test and HPV test every 5 years or have a Pap test every 3 years.  You may be screened for cervical cancer more   often if you have risk factors for cervical cancer.  If your Pap tests are abnormal, you may have an HPV test.  If you have had the HPV vaccine, you will still be screened for cervical cancer and follow normal screening recommendations.  You do not need to be screened for cervical cancer if any of the following apply to you:  You are older than age 61 and you have not had a serious cervical precancer or cancer in the last 20  years.  Your cervix and uterus have been removed and you have never had cervical cancer or precancerous cells.  Endometrial cancer There is no standard screening test for endometrial cancer, but the cancer can be detected with:  A test of a sample of tissue taken from the lining of the uterus (endometrial tissue biopsy).  A vaginal ultrasound.  Pap tests.  If you are at increased risk for endometrial cancer, you may need to have these tests more often than normal. You are at increased risk if:  You have a family history of ovarian, uterine, or colon cancer.  You are taking tamoxifen, a drug that is used to treat breast cancer.  You have certain types of colon cancer.  If you have reached menopause, it is especially important to talk with your health care provider about any vaginal bleeding or spotting. Screening for endometrial cancer is not recommended for women who do not have symptoms of the cancer, such as vaginal bleeding. Colorectal cancer Screening for colorectal cancer is recommended starting at age 73 for most women. If you have a family history of colon or rectal cancer or other risk factors, you may need to start having screenings earlier. Talk with your health care provider about which screening test is right for you and how often you should be screened. Colorectal cancer screening looks for cancer or for growths called polyps that often form before cancer starts. Tests to look for cancer or polyps include:  Colonoscopy or flexible sigmoidoscopy. For these procedures, a flexible tube with a small camera is inserted into the rectum.  CT colonography. This test uses X-rays and a contrast dye to check the colon for polyps. If a polyp is found, you may need to have a colonoscopy so the polyp can be located and removed.  Tests to look for cancer in the stool (feces) include:  Guaiac-based fecal occult blood test (FOBT). This test detects blood in stool. It can be done at home  with a kit.  Fecal immunochemical test (FIT). This test detects blood in stool. For this test, you will need to collect stool samples at home.  Stool DNA test. This test looks for blood in stool and any changes in DNA that can lead to colon cancer. For this test, you will need to collect a stool sample at home and send it to a lab.  Skin cancer Skin cancer screening is done by checking the skin for unusual moles or spots and any changes in existing moles. Your health care provider should check your skin for signs of skin cancer at every physical exam. You should check your skin every month and tell your health care provider right away if anything looks unusual. Women with a higher-than-normal risk for skin cancer may want to see a skin specialist (dermatologist) for an annual body check. Lung cancer Lung cancer screening is done with a CT scan that looks for abnormal cells in the lungs. Discuss lung cancer screening with your health care provider  if you are 31-29 years old and if any of the following apply to you:  You currently smoke.  You used to smoke heavily.  You have had at least a 30-pack-year smoking history.  You have quit smoking within the past 15 years.  If you smoke heavily or if you used to smoke, you may need to be screened every year. Where to find more information:  [place]: SkinPromotion.no  Centers for Disease Control and Prevention: http://knight-sullivan.biz/  Department of Health and Human Services: BankingDetective.si Contact a health care provider if:  You have concerns about any signs or symptoms of cancer, such as: ? Moles that have an unusual shape or color. ? Changes in existing moles. ? A sore on your skin that does not heal. ? Blood in your stool. ? Fatigue that does not go away. ? Frequent pain or cramping in your  abdomen. ? Coughing, or coughing up blood. ? Losing weight without trying. ? Lumps or other changes in your breasts. ? Vaginal bleeding, spotting, or changes in your periods. Summary  Be aware of and watch for signs and symptoms of cancer, especially symptoms of breast cancer, cervical cancer, endometrial cancer, colorectal cancer, skin cancer, and lung cancer.  Early detection of cancer with cancer screening may save your life.  Talk with your health care provider about your specific cancer risks.  Work together with your health care provider to create a cancer screening plan that is right for you. This information is not intended to replace advice given to you by your health care provider. Make sure you discuss any questions you have with your health care provider. Document Released: 10/20/2015 Document Revised: 10/20/2015 Document Reviewed: 10/20/2015 Elsevier Interactive Patient Education  [employer].

## 2017-05-15 NOTE — Progress Notes (Signed)
Chief Complaint  Patient presents with  . Hypertension    recheck.  Daughter would like pt bs and cholesterol checked  . Medication Refill    amlodipine, docusate sodium and losartan potassium pt is completely out    HPI  Uncontrolled Hypertension Pt has been out of her bp meds for over a month because she ran out She has left side numbness from a previous stroke but denies any new symptoms No chest pain, palpitations or shortness or breath No dizziness or vision changes BP Readings from Last 3 Encounters:  05/15/17 (!) 166/115  08/01/16 (!) 162/93  05/15/16 (!) 140/100   She has been seen for other visits and her bp is not checked at home and the record shows that her bp has not been controlled.  Colon Cancer Screening She has never had a colonoscopy SHe denies blood in his stool, unexpected weight loss or pain with defecation No rectal itching SHe does not smoke SHe does not have a family history of colon cancer Lab Results  Component Value Date   WBC 5.6 08/01/2016   HGB 11.2 08/01/2016   HCT 35.7 08/01/2016   MCV 67 (L) 08/01/2016   PLT 170 08/01/2016     Screening for Diabetes and Polyuria She has no history of diabetes Her family history is unknown of her diabetes She does not currently have any polydipsia, polyphagia but has polyuria. No dysuria or blood in the urine She wakes up at night to urinate  Lab Results  Component Value Date   HGBA1C 5.5 06/21/2015   High Cholesterol She does not take any medications for cholesterol Her last lipid showed elevated LDL She is not on any fiber supplements  She eats most meals at home Lab Results  Component Value Date   CHOL 255 (H) 08/01/2016   HDL 102 08/01/2016   LDLCALC 139 (H) 08/01/2016   TRIG 68 08/01/2016   CHOLHDL 2.5 08/01/2016    Chronic Left  Hemiparesis Left hemiparesis following a hemorrhagic stroke 2009 She is walking with a cane  No recent falls Left side with numbness and weakness She  resides on the first floor of the residence   Past Medical History:  Diagnosis Date  . 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 Hemorrhagic stroke ([place])   . Hypertension   . 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 Microcytic anemia     Current Outpatient Medications  Medication Sig Dispense Refill  . amLODipine (NORVASC) 5 MG tablet TAKE 1 TABLET(5 MG) BY MOUTH DAILY 30 tablet 1  . docusate sodium (COLACE) 100 MG capsule Take 3 capsules (300 mg total) by mouth daily. 90 capsule 0  . losartan (COZAAR) 50 MG tablet Take 1 tablet (50 mg total) by mouth daily. 30 tablet 1  . polyethylene glycol powder (GLYCOLAX/MIRALAX) powder Take 17 g by mouth daily. 500 g 1   No current facility-administered medications for this visit.     Allergies: No Known Allergies  No past surgical history on file.  Social History   Socioeconomic History  . Marital status: Married    Spouse name: Not on file  . Number of children: 4  . Years of education: Not on file  . Highest education level: Not on file  Occupational History  . Not on file  Social Needs  . Financial resource strain: Not on file  . Food insecurity:    Worry: Not on file    Inability: Not on file  . Transportation needs:  Medical: Not on file    Non-medical: Not on file  Tobacco Use  . Smoking status: Former Smoker    Years: 3.00    Types: Cigarettes    Last attempt to quit: 02/06/2003    Years since quitting: 14.2  . Smokeless tobacco: Never Used  Substance and Sexual Activity  . Alcohol use: No    Alcohol/week: 0.0 oz  . Drug use: No  . Sexual activity: Not on file  Lifestyle  . Physical activity:    Days per week: Not on file    Minutes per session: Not on file  . Stress: Not on file  Relationships  . Social connections:    Talks on phone: Not on file    Gets together: Not on file    Attends religious service: Not on file    Active member of club or organization: Not on file    Attends meetings of clubs or organizations: Not on  file    Relationship status: Not on file  Other Topics Concern  . Not on file  Social History Narrative  . Not on file    Family History  Family history unknown: Yes     ROS Review of Systems See HPI [place]: No fevers or chills No malaise No diaphoresis Skin: No rash or itching Eyes: no blurry vision, no double vision GU: no dysuria or hematuria, see hpi Neuro: no dizziness or headaches, +chronic left side weakness * all others reviewed and negative   Objective: Vitals:   05/15/17 0812  BP: (!) 166/115  Pulse: 65  Resp: 16  Temp: 98.2 F (36.8 C)  TempSrc: Oral  SpO2: 99%  Weight: 127 lb 3.2 oz (57.7 kg)  Height: 4' 11.5" (1.511 m)    Physical Exam  Constitutional: She is oriented to person, place, and time. She appears well-developed and well-nourished.  HENT:  Head: Normocephalic and atraumatic.  Right Ear: External ear normal.  Left Ear: External ear normal.  Nose: Nose normal.  Mouth/Throat: Oropharynx is clear and moist. No oropharyngeal exudate.  Eyes: Conjunctivae and EOM are normal.  Neck: Normal range of motion. No JVD present. No thyromegaly present.  No carotid bruits  Cardiovascular: Normal rate, regular rhythm and normal heart sounds.  No murmur heard. Pulmonary/Chest: Effort normal and breath sounds normal. No stridor. No respiratory distress. She has no wheezes.  Abdominal: Soft. Bowel sounds are normal. She exhibits no distension and no mass. There is no tenderness. There is no guarding.  No flank pain, no CVA tenderness  Lymphadenopathy:    She has no cervical adenopathy.  Neurological: She is alert and oriented to person, place, and time.  Left upper and lower extremities with muscle weakness  Strength 3/5 on left upper and lower Reflex 2+ at the right patellar, 3+ at the left patellar  Skin: Skin is warm. Capillary refill takes less than 2 seconds. No erythema.  Psychiatric: She has a normal mood and affect. Her behavior is  normal. Judgment and thought content normal.    Assessment and Plan Nadege was seen today for hypertension and medication refill.  Diagnoses and all orders for this visit:  Uncontrolled hypertension- restarted bp meds Will bring patient back in for close follow up in six weeks -     Comprehensive metabolic panel -     TSH  Colon cancer screening- discussed screening based on age and previous history of anemia -     Ambulatory referral to Gastroenterology -  CBC  Need for Tdap vaccination -     Tdap vaccine greater than or equal to 7yo IM  Hemorrhagic stroke ([place])- discussed that she should maintain good bp to prevent recurrent stroke -     Lipid panel -     Hemoglobin A1c  Screening for diabetes mellitus -     POCT urinalysis dipstick  Screening, lipid  Elevated cholesterol- discussed LDL goal of <100 -     Lipid panel -     TSH -     Hemoglobin A1c  Left spastic hemiparesis ([place])- discussed fall precautions Gave handicap parking form  Iron deficiency anemia, unspecified iron deficiency anemia type- will refer for colon cancer screening  -     CBC  Polyuria- will rule out infection but will also screen for diabetes -     POCT urinalysis dipstick  Essential hypertension- refilled bp meds -     losartan (COZAAR) 50 MG tablet; Take 1 tablet (50 mg total) by mouth daily. -     amLODipine (NORVASC) 5 MG tablet; TAKE 1 TABLET(5 MG) BY MOUTH DAILY   A total of 45 minutes were spent face-to-face with the patient during this encounter and over half of that time was spent on counseling and coordination of care.   [place]

## 2017-05-16 ENCOUNTER — Telehealth: Payer: Self-pay | Admitting: Family Medicine

## 2017-05-16 ENCOUNTER — Encounter: Payer: Self-pay | Admitting: Internal Medicine

## 2017-05-16 LAB — LIPID PANEL
CHOL/HDL RATIO: 2.6 ratio (ref 0.0–4.4)
Cholesterol, Total: 246 mg/dL — ABNORMAL HIGH (ref 100–199)
HDL: 93 mg/dL (ref >39)
LDL Calculated: 141 mg/dL — ABNORMAL HIGH (ref 0–99)
Triglycerides: 59 mg/dL (ref 0–149)
VLDL Cholesterol Cal: 12 mg/dL (ref 5–40)

## 2017-05-16 LAB — COMPREHENSIVE METABOLIC PANEL
ALT: 17 IU/L (ref 0–32)
AST: 23 IU/L (ref 0–40)
Albumin/Globulin Ratio: 1.4 (ref 1.2–2.2)
Albumin: 4.2 g/dL (ref 3.5–5.5)
Alkaline Phosphatase: 41 IU/L (ref 39–117)
BILIRUBIN TOTAL: 0.7 mg/dL (ref 0.0–1.2)
BUN/Creatinine Ratio: 22 (ref 9–23)
BUN: 20 mg/dL (ref 6–24)
CO2: 24 mmol/L (ref 20–29)
Calcium: 8.7 mg/dL (ref 8.7–10.2)
Chloride: 105 mmol/L (ref 96–106)
Creatinine, Ser: 0.93 mg/dL (ref 0.57–1.00)
GFR, EST AFRICAN AMERICAN: 78 mL/min/{1.73_m2} (ref >59)
GFR, EST NON AFRICAN AMERICAN: 67 mL/min/{1.73_m2} (ref >59)
GLOBULIN, TOTAL: 3 g/dL (ref 1.5–4.5)
GLUCOSE: 88 mg/dL (ref 65–99)
POTASSIUM: 4.1 mmol/L (ref 3.5–5.2)
Sodium: 142 mmol/L (ref 134–144)
Total Protein: 7.2 g/dL (ref 6.0–8.5)

## 2017-05-16 LAB — CBC
Hematocrit: 37.1 % (ref 34.0–46.6)
Hemoglobin: 11.6 g/dL (ref 11.1–15.9)
MCH: 20.6 pg — ABNORMAL LOW (ref 26.6–33.0)
MCHC: 31.3 g/dL — ABNORMAL LOW (ref 31.5–35.7)
MCV: 66 fL — ABNORMAL LOW (ref 79–97)
PLATELETS: 178 10*3/uL (ref 150–379)
RBC: 5.63 x10E6/uL — ABNORMAL HIGH (ref 3.77–5.28)
RDW: 16.5 % — AB (ref 12.3–15.4)
WBC: 5.3 10*3/uL (ref 3.4–10.8)

## 2017-05-16 LAB — HEMOGLOBIN A1C
ESTIMATED AVERAGE GLUCOSE: 114 mg/dL
HEMOGLOBIN A1C: 5.6 % (ref 4.8–5.6)

## 2017-05-16 LAB — TSH: TSH: 1.75 u[IU]/mL (ref 0.450–4.500)

## 2017-05-16 MED ORDER — ATORVASTATIN CALCIUM 20 MG PO TABS: 20.0000 mg | ORAL_TABLET | Freq: Every day | ORAL | 3 refills | Status: DC

## 2017-05-16 NOTE — Addendum Note (Signed)
Addended by: Delia Chimes A on: 05/16/2017 04:44 AM   Modules accepted: Orders

## 2017-05-16 NOTE — Telephone Encounter (Signed)
Lab results given to her daughter, Geryl Councilman, and instructions. Verbalizes understanding.

## 2017-07-10 ENCOUNTER — Ambulatory Visit (AMBULATORY_SURGERY_CENTER): Payer: Self-pay

## 2017-07-10 ENCOUNTER — Other Ambulatory Visit: Payer: Self-pay

## 2017-07-10 VITALS — Ht <= 58 in | Wt 129.0 lb

## 2017-07-10 DIAGNOSIS — Z1211 Encounter for screening for malignant neoplasm of colon: Secondary | ICD-10-CM

## 2017-07-10 NOTE — Progress Notes (Signed)
No egg or soy allergy known to patient  Pt daughter verbalize pt has never been put to sleep, no prior surgery or procedure. No diet pills per patient No home 02 use per patient  No blood thinners per patient  Pt denies issues with constipation  No A fib or A flutter  EMMI video sent to pt's e mail , sent her daughter the e-mail.

## 2017-07-15 ENCOUNTER — Encounter: Payer: Self-pay | Admitting: Internal Medicine

## 2017-07-23 ENCOUNTER — Encounter: Payer: Self-pay | Admitting: Internal Medicine

## 2017-07-23 ENCOUNTER — Ambulatory Visit (AMBULATORY_SURGERY_CENTER): Payer: BLUE CROSS/BLUE SHIELD | Admitting: Internal Medicine

## 2017-07-23 ENCOUNTER — Other Ambulatory Visit: Payer: Self-pay

## 2017-07-23 ENCOUNTER — Other Ambulatory Visit: Payer: Self-pay | Admitting: Family Medicine

## 2017-07-23 VITALS — BP 159/96 | HR 54 | Temp 98.6°F | Resp 20 | Ht 59.0 in | Wt 127.0 lb

## 2017-07-23 DIAGNOSIS — D124 Benign neoplasm of descending colon: Secondary | ICD-10-CM | POA: Diagnosis not present

## 2017-07-23 DIAGNOSIS — I10 Essential (primary) hypertension: Secondary | ICD-10-CM

## 2017-07-23 DIAGNOSIS — Z1211 Encounter for screening for malignant neoplasm of colon: Secondary | ICD-10-CM | POA: Diagnosis not present

## 2017-07-23 DIAGNOSIS — D125 Benign neoplasm of sigmoid colon: Secondary | ICD-10-CM | POA: Diagnosis not present

## 2017-07-23 MED ORDER — SODIUM CHLORIDE 0.9 % IV SOLN: 500.0000 mL | Freq: Once | INTRAVENOUS | Status: DC

## 2017-07-23 NOTE — Progress Notes (Signed)
Called to room to assist during endoscopic procedure.  Patient ID and intended procedure confirmed with present staff. Received instructions for my participation in the procedure from the performing physician.  

## 2017-07-23 NOTE — Op Note (Signed)
[facility] Patient Name: Barbara Strickland Procedure Date: 07/23/2017 2:05 PM MRN: [id number] Endoscopist: Gatha Mayer , MD Age: 57 Referring MD:  Date of Birth: Apr 14, 1960 Gender: Female Account #: [id number] Procedure:                Colonoscopy Indications:              Screening for colorectal malignant neoplasm, This                            is the patient's first colonoscopy Medicines:                Propofol per Anesthesia, Monitored Anesthesia Care Procedure:                Pre-Anesthesia Assessment:                           - Prior to the procedure, a History and Physical                            was performed, and patient medications and                            allergies were reviewed. The patient's tolerance of                            previous anesthesia was also reviewed. The risks                            and benefits of the procedure and the sedation                            options and risks were discussed with the patient.                            All questions were answered, and informed consent                            was obtained. Prior Anticoagulants: The patient has                            taken no previous anticoagulant or antiplatelet                            agents. ASA Grade Assessment: III - A patient with                            severe systemic disease. After reviewing the risks                            and benefits, the patient was deemed in                            satisfactory condition to undergo the procedure.  After obtaining informed consent, the colonoscope                            was passed under direct vision. Throughout the                            procedure, the patient's blood pressure, pulse, and                            oxygen saturations were monitored continuously. The                            Colonoscope was introduced through the anus and                             advanced to the the cecum, identified by                            appendiceal orifice and ileocecal valve. The                            colonoscopy was performed without difficulty. The                            patient tolerated the procedure well. The quality                            of the bowel preparation was good. The bowel                            preparation used was Miralax. The ileocecal valve,                            appendiceal orifice, and rectum were photographed. Scope In: 2:23:21 PM Scope Out: 2:37:36 PM Scope Withdrawal Time: 0 hours 11 minutes 42 seconds  Total Procedure Duration: 0 hours 14 minutes 15 seconds  Findings:                 The perianal and digital rectal examinations were                            normal.                           A 15 mm polyp was found in the sigmoid colon. The                            polyp was pedunculated. The polyp was removed with                            a hot snare. Resection and retrieval were complete.                            Verification of patient identification for the  specimen was done. Estimated blood loss was minimal.                           A diminutive polyp was found in the descending                            colon. The polyp was sessile. The polyp was removed                            with a cold snare. Resection and retrieval were                            complete. Verification of patient identification                            for the specimen was done. Estimated blood loss was                            minimal.                           The exam was otherwise without abnormality on                            direct and retroflexion views. Complications:            No immediate complications. Estimated Blood Loss:     Estimated blood loss was minimal. Impression:               - One 15 mm polyp in the sigmoid colon, removed                            with a hot  snare. Resected and retrieved.                           - One diminutive polyp in the descending colon,                            removed with a cold snare. Resected and retrieved.                           - The examination was otherwise normal on direct                            and retroflexion views. Recommendation:           - Patient has a contact number available for                            emergencies. The signs and symptoms of potential                            delayed complications were discussed with the  patient. Return to normal activities tomorrow.                            Written discharge instructions were provided to the                            patient.                           - Resume previous diet.                           - Continue present medications.                           - No aspirin, ibuprofen, naproxen, or other                            non-steroidal anti-inflammatory drugs for 2 weeks                            after polyp removal.                           - Repeat colonoscopy is recommended for                            surveillance. The colonoscopy date will be                            determined after pathology results from today's                            exam become available for review.                           - Note she has a chronic mild anemia and sig                            microcytosis that is very likely a thalassemia                            would not tx w/ iron Gatha Mayer, MD 07/23/2017 2:47:50 PM This report has been signed electronically.

## 2017-07-23 NOTE — Progress Notes (Signed)
Report given to PACU, vss 

## 2017-07-23 NOTE — Patient Instructions (Addendum)
   I found and removed 2 polyps - both look benign. I will let you know pathology results and when to have another routine colonoscopy by mail and/or My Chart.  I appreciate the opportunity to care for you. Gatha Mayer, MD, [facility]  Handouts given:  Polyps  YOU HAD AN ENDOSCOPIC PROCEDURE TODAY AT [facility]:   Refer to the procedure report that was given to you for any specific questions about what was found during the examination.  If the procedure report does not answer your questions, please call your gastroenterologist to clarify.  If you requested that your care partner not be given the details of your procedure findings, then the procedure report has been included in a sealed envelope for you to review at your convenience later.  YOU SHOULD EXPECT: Some feelings of bloating in the abdomen. Passage of more gas than usual.  Walking can help get rid of the air that was put into your GI tract during the procedure and reduce the bloating. If you had a lower endoscopy (such as a colonoscopy or flexible sigmoidoscopy) you may notice spotting of blood in your stool or on the toilet paper. If you underwent a bowel prep for your procedure, you may not have a normal bowel movement for a few days.  Please Note:  You might notice some irritation and congestion in your nose or some drainage.  This is from the oxygen used during your procedure.  There is no need for concern and it should clear up in a day or so.  SYMPTOMS TO REPORT IMMEDIATELY:   Following lower endoscopy (colonoscopy or flexible sigmoidoscopy):  Excessive amounts of blood in the stool  Significant tenderness or worsening of abdominal pains  Swelling of the abdomen that is new, acute  Fever of 100F or higher   For urgent or emergent issues, a gastroenterologist can be reached at any hour by calling [phone].   DIET:  We do recommend a small meal at first, but then you may proceed to your regular  diet.  Drink plenty of fluids but you should avoid alcoholic beverages for 24 hours.  ACTIVITY:  You should plan to take it easy for the rest of today and you should NOT DRIVE or use heavy machinery until tomorrow (because of the sedation medicines used during the test).    FOLLOW UP: Our staff will call the number listed on your records the next business day following your procedure to check on you and address any questions or concerns that you may have regarding the information given to you following your procedure. If we do not reach you, we will leave a message.  However, if you are feeling well and you are not experiencing any problems, there is no need to return our call.  We will assume that you have returned to your regular daily activities without incident.  If any biopsies were taken you will be contacted by phone or by letter within the next 1-3 weeks.  Please call us at [phone] if you have not heard about the biopsies in 3 weeks.    SIGNATURES/CONFIDENTIALITY: You and/or your care partner have signed paperwork which will be entered into your electronic medical record.  These signatures attest to the fact that that the information above on your After Visit Summary has been reviewed and is understood.  Full responsibility of the confidentiality of this discharge information lies with you and/or your care-partner.

## 2017-07-23 NOTE — Progress Notes (Signed)
Pt's states no medical or surgical changes since previsit or office visit.  clinets daughter Lurline Hare is interpreting for client. Client has paralysis on left upper extremity and lower extremity from stroke in 2009

## 2017-07-24 ENCOUNTER — Telehealth: Payer: Self-pay

## 2017-07-24 NOTE — Telephone Encounter (Signed)
  Follow up Call-  Call back number 07/23/2017  Post procedure Call Back phone  # [phone]  Permission to leave phone message Yes  Some recent data might be hidden     Patient questions:  Do you have a fever, pain , or abdominal swelling? No. Pain Score  0 *  Have you tolerated food without any problems? Yes.    Have you been able to return to your normal activities? Yes.    Do you have any questions about your discharge instructions: Diet   No. Medications  No. Follow up visit  No.  Do you have questions or concerns about your Care? No.  Actions: * If pain score is 4 or above: No action needed, pain <4.

## 2017-07-24 NOTE — Telephone Encounter (Signed)
Refill request for amlodipine 5 mg #30 with 1 refill and atorvastatin 20 mg #30 with 1 refill approved. [place], CMA

## 2017-08-05 ENCOUNTER — Encounter: Payer: Self-pay | Admitting: Internal Medicine

## 2017-08-05 DIAGNOSIS — Z860101 Personal history of adenomatous and serrated colon polyps: Secondary | ICD-10-CM

## 2017-08-05 DIAGNOSIS — Z86010 Personal history of colonic polyps: Secondary | ICD-10-CM

## 2017-08-05 HISTORY — DX: Personal history of adenomatous and serrated colon polyps: Z86.0101

## 2017-08-05 HISTORY — DX: Personal history of colonic polyps: Z86.010

## 2017-08-05 NOTE — Progress Notes (Signed)
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

## 2017-08-20 ENCOUNTER — Other Ambulatory Visit: Payer: Self-pay | Admitting: Family Medicine

## 2017-08-20 DIAGNOSIS — I10 Essential (primary) hypertension: Secondary | ICD-10-CM

## 2017-09-23 ENCOUNTER — Other Ambulatory Visit: Payer: Self-pay | Admitting: Family Medicine

## 2017-09-23 DIAGNOSIS — I10 Essential (primary) hypertension: Secondary | ICD-10-CM

## 2017-09-23 NOTE — Telephone Encounter (Signed)
Last refill on Amlodipine: 08/21/17; # 30; no refills Last refill on Losartan: 08/21/17; #30; no refills Last office visit 05/15/17 PCP: Dr. Nolon Rod  Call placed to pt's daughter, Lurline Hare.  Advised that pt. Is overdue for office f/u.  Scheduled f/u on BP on 10/03/17 @ 8:40 AM.    Refilled both above medic. with # 15; no refills.  Daughter made aware that Rx is being filled to cover until she is seen in office.  Verb. Understanding.

## 2017-10-03 ENCOUNTER — Ambulatory Visit: Payer: BLUE CROSS/BLUE SHIELD | Admitting: Family Medicine

## 2017-10-03 ENCOUNTER — Encounter: Payer: Self-pay | Admitting: Family Medicine

## 2017-10-03 ENCOUNTER — Other Ambulatory Visit: Payer: Self-pay

## 2017-10-03 VITALS — BP 135/94 | HR 65 | Temp 97.7°F | Resp 16 | Ht 59.0 in | Wt 125.2 lb

## 2017-10-03 DIAGNOSIS — I10 Essential (primary) hypertension: Secondary | ICD-10-CM | POA: Diagnosis not present

## 2017-10-03 DIAGNOSIS — M21372 Foot drop, left foot: Secondary | ICD-10-CM

## 2017-10-03 DIAGNOSIS — D509 Iron deficiency anemia, unspecified: Secondary | ICD-10-CM

## 2017-10-03 DIAGNOSIS — Z1231 Encounter for screening mammogram for malignant neoplasm of breast: Secondary | ICD-10-CM

## 2017-10-03 DIAGNOSIS — Z1159 Encounter for screening for other viral diseases: Secondary | ICD-10-CM

## 2017-10-03 DIAGNOSIS — E785 Hyperlipidemia, unspecified: Secondary | ICD-10-CM

## 2017-10-03 DIAGNOSIS — Z1239 Encounter for other screening for malignant neoplasm of breast: Secondary | ICD-10-CM

## 2017-10-03 DIAGNOSIS — I619 Nontraumatic intracerebral hemorrhage, unspecified: Secondary | ICD-10-CM

## 2017-10-03 MED ORDER — DICLOFENAC SODIUM 1 % TD GEL: 2.0000 g | Freq: Four times a day (QID) | TRANSDERMAL | 3 refills | Status: DC

## 2017-10-03 MED ORDER — ATORVASTATIN CALCIUM 20 MG PO TABS: 20.0000 mg | ORAL_TABLET | Freq: Every day | ORAL | 3 refills | Status: DC

## 2017-10-03 MED ORDER — AMLODIPINE BESYLATE 5 MG PO TABS: 5.0000 mg | ORAL_TABLET | Freq: Every day | ORAL | 1 refills | Status: DC

## 2017-10-03 MED ORDER — LOSARTAN POTASSIUM 50 MG PO TABS: ORAL_TABLET | ORAL | 1 refills | Status: DC

## 2017-10-03 NOTE — Patient Instructions (Addendum)
Apply diclofenac (voltaren) gel 2-3 times a day over the affected joints   We recommend that you schedule a mammogram for breast cancer screening. Typically, you do not need a referral to do this. Please contact a local imaging center to schedule your mammogram.  [facility] - [phone]  *ask for the [facility] [place] ([place]) - [phone] or [phone]  [facility] - [phone] [place] [phone] [facility] - [phone]  *ask for the [facility] - [phone]  *ask for the Radiology Department [facility] - [phone]  *ask for the [place] - [phone]    If you have lab work done today you will be contacted with your lab results within the next 2 weeks.  If you have not heard from [place] then please contact us. The fastest way to get your results is to register for My Chart.   IF you received an x-ray today, you will receive an invoice from [facility]. Please contact [facility] at [phone] with questions or concerns regarding your invoice.   IF you received labwork today, you will receive an invoice from [facility]. Please contact [facility] at [phone] with questions or concerns regarding your invoice.   Our billing staff will not be able to assist you with questions regarding bills from these companies.  You will be contacted with the lab results as soon as they are available. The fastest way to get your results is to activate your My Chart account. Instructions are located on the last page of this paperwork. If you have not heard from [place] regarding the results in 2 weeks, please contact this office.     Breast Self-Awareness Breast self-awareness means:  Knowing how your breasts look.  Knowing how your breasts feel.  Checking your breasts every month for  changes.  Telling your doctor if you notice a change in your breasts.  Breast self-awareness allows you to notice a breast problem early while it is still small. How to do a breast self-exam One way to learn what is normal for your breasts and to check for changes is to do a breast self-exam. To do a breast self-exam: Look for Changes  1. Take off all the clothes above your waist. 2. Stand in front of a mirror in a room with good lighting. 3. Put your hands on your hips. 4. Push your hands down. 5. Look at your breasts and nipples in the mirror to see if one breast or nipple looks different than the other. Check to see if: ? The shape of one breast is different. ? The size of one breast is different. ? There are wrinkles, dips, and bumps in one breast and not the other. 6. Look at each breast for changes in your skin, such as: ? Redness. ? Scaly areas. 7. Look for changes in your nipples, such as: ? Liquid around the nipples. ? Bleeding. ? Dimpling. ? Redness. ? A change in where the nipples are. Feel for Changes 1. Lie on your back on the floor. 2. Feel each breast. To do this, follow these steps: ? Pick a breast to feel. ? Put the arm closest to that breast above your head. ? Use your other arm to feel the nipple area of your breast. Feel the area with the pads of your three middle fingers by making small circles with your fingers. For the  first circle, press lightly. For the second circle, press harder. For the third circle, press even harder. ? Keep making circles with your fingers at the light, harder, and even harder pressures as you move down your breast. Stop when you feel your ribs. ? Move your fingers a little toward the center of your body. ? Start making circles with your fingers again, this time going up until you reach your collarbone. ? Keep making up and down circles until you reach your armpit. Remember to keep using the three pressures. ? Feel the other breast in  the same way. 3. Sit or stand in the shower or tub. 4. With soapy water on your skin, feel each breast the same way you did in step 2, when you were lying on the floor. Write Down What You Find  After doing the self-exam, write down:  What is normal for each breast.  Any changes you find in each breast.  When you last had your period.  How often should I check my breasts? Check your breasts every month. If you are breastfeeding, the best time to check them is after you feed your baby or after you use a breast pump. If you get periods, the best time to check your breasts is 5-7 days after your period is over. When should I see my doctor? See your doctor if you notice:  A change in shape or size of your breasts or nipples.  A change in the skin of your breast or nipples, such as red or scaly skin.  Unusual fluid coming from your nipples.  A lump or thick area that was not there before.  Pain in your breasts.  Anything that concerns you.  This information is not intended to replace advice given to you by your health care provider. Make sure you discuss any questions you have with your health care provider. Document Released: 07/11/2007 Document Revised: 06/30/2015 Document Reviewed: 12/12/2014 Elsevier Interactive Patient Education  [employer].

## 2017-10-03 NOTE — Progress Notes (Signed)
Chief Complaint  Patient presents with  . Hypertension    f/u   . Medication Refill    amlodipine, cozaar, lipitor    HPI Hypertension: Patient here for follow-up of elevated blood pressure. She is exercising and is adherent to low salt diet.  Blood pressure is well controlled at home. Cardiac symptoms none. Patient denies chest pain, chest pressure/discomfort, claudication, exertional chest pressure/discomfort, fatigue and irregular heart beat.  Cardiovascular risk factors: hypertension. Use of agents associated with hypertension: none. History of target organ damage: stroke.   BP Readings from Last 3 Encounters:  10/03/17 (!) 135/94  07/23/17 (!) 159/96  05/15/17 (!) 166/115   Dyslipidemia: Patient presents for evaluation of lipids.  Compliance with treatment thus far has been excellent.  A repeat fasting lipid profile was done.  The patient does not use medications that may worsen dyslipidemias (corticosteroids, progestins, anabolic steroids, diuretics, beta-blockers, amiodarone, cyclosporine, olanzapine). The patient exercises daily.  Hemorrhagic Stroke with left side weakness She had a stroke 10 years ago She has been having continued weakness with a left hand that does not move or grip She has limping of the left foot    Past Medical History:  Diagnosis Date  . 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 Hemorrhagic stroke ([place])   . Hx of adenomatous colonic polyps 08/05/2017  . Hypertension   . Kidney stones    kidney stones   . Left spastic hemiparesis ([place])   . Microcytic anemia   . Stroke [facility])     Current Outpatient Medications  Medication Sig Dispense Refill  . amLODipine (NORVASC) 5 MG tablet Take 1 tablet (5 mg total) by mouth daily. 90 tablet 1  . atorvastatin (LIPITOR) 20 MG tablet Take 1 tablet (20 mg total) by mouth daily. 90 tablet 3  . losartan (COZAAR) 50 MG tablet TAKE 1 TABLET(50 MG) BY MOUTH DAILY 90 tablet 1  . diclofenac sodium (VOLTAREN) 1 % GEL Apply 2 g  topically 4 (four) times daily. 100 g 3   No current facility-administered medications for this visit.     Allergies: No Known Allergies  No past surgical history on file.  Social History   Socioeconomic History  . Marital status: Married    Spouse name: Not on file  . Number of children: 4  . Years of education: Not on file  . Highest education level: Not on file  Occupational History  . Not on file  Social Needs  . Financial resource strain: Not on file  . Food insecurity:    Worry: Not on file    Inability: Not on file  . Transportation needs:    Medical: Not on file    Non-medical: Not on file  Tobacco Use  . Smoking status: Former Smoker    Years: 3.00    Types: Cigarettes    Last attempt to quit: 02/06/2003    Years since quitting: 14.6  . Smokeless tobacco: Never Used  Substance and Sexual Activity  . Alcohol use: No    Alcohol/week: 0.0 standard drinks  . Drug use: No  . Sexual activity: Not on file  Lifestyle  . Physical activity:    Days per week: Not on file    Minutes per session: Not on file  . Stress: Not on file  Relationships  . Social connections:    Talks on phone: Not on file    Gets together: Not on file    Attends religious service: Not on file    Active member  of club or organization: Not on file    Attends meetings of clubs or organizations: Not on file    Relationship status: Not on file  Other Topics Concern  . Not on file  Social History Narrative  . Not on file    Family History  Problem Relation Age of Onset  . Colon cancer Neg Hx   . Colon polyps Neg Hx   . Esophageal cancer Neg Hx   . Rectal cancer Neg Hx   . Stomach cancer Neg Hx      ROS Review of Systems See HPI Constitution: No fevers or chills No malaise No diaphoresis Skin: No rash or itching Eyes: no blurry vision, no double vision GU: no dysuria or hematuria Neuro: no dizziness or headaches, see hpi all others reviewed and negative    Objective: Vitals:   10/03/17 0900  BP: (!) 135/94  Pulse: 65  Resp: 16  Temp: 97.7 F (36.5 C)  TempSrc: Oral  SpO2: 99%  Weight: 125 lb 3.2 oz (56.8 kg)  Height: 4\' 11"  (1.499 m)    Physical Exam  Constitutional: She is oriented to person, place, and time. She appears well-developed and well-nourished.  HENT:  Head: Normocephalic and atraumatic.  Eyes: Conjunctivae and EOM are normal.  Neck: Normal range of motion. Neck supple.  Cardiovascular: Normal rate, regular rhythm and normal heart sounds.  No murmur heard. Pulmonary/Chest: Effort normal and breath sounds normal. No stridor. No respiratory distress. She has no wheezes.  Neurological: She is alert and oriented to person, place, and time.  Left arm strength 1/5 Left leg weakness with limping, visible foot drop on left  Skin: Skin is warm. Capillary refill takes less than 2 seconds.  Psychiatric: She has a normal mood and affect. Her behavior is normal. Judgment and thought content normal.    Assessment and Plan Mckennah was seen today for hypertension and medication refill.  Diagnoses and all orders for this visit:  Essential hypertension- bp has improved Continue exercise -     losartan (COZAAR) 50 MG tablet; TAKE 1 TABLET(50 MG) BY MOUTH DAILY -     amLODipine (NORVASC) 5 MG tablet; Take 1 tablet (5 mg total) by mouth daily.  Screening for breast cancer -     MM Digital Screening; Future  Need for hepatitis C screening test -     HCV Ab w/Rflx to Verification  Microcytic anemia-  Will check levels -     CBC -     Ferritin  Dyslipidemia - will continue to monitor -     Lipid panel  Foot drop, left-  Advised voltaren for hip and knee pain due to the gait abnormalities  Hemorrhagic stroke ([place]) - reviewed prevention of further strokes  Other orders -     atorvastatin (LIPITOR) 20 MG tablet; Take 1 tablet (20 mg total) by mouth daily. -     diclofenac sodium (VOLTAREN) 1 % GEL; Apply 2 g topically 4  (four) times daily.     [place]

## 2017-10-04 LAB — CBC
HEMOGLOBIN: 11.6 g/dL (ref 11.1–15.9)
Hematocrit: 39.2 % (ref 34.0–46.6)
MCH: 19.9 pg — ABNORMAL LOW (ref 26.6–33.0)
MCHC: 29.6 g/dL — AB (ref 31.5–35.7)
MCV: 67 fL — ABNORMAL LOW (ref 79–97)
PLATELETS: 187 10*3/uL (ref 150–450)
RBC: 5.82 x10E6/uL — ABNORMAL HIGH (ref 3.77–5.28)
RDW: 17 % — AB (ref 12.3–15.4)
WBC: 6.5 10*3/uL (ref 3.4–10.8)

## 2017-10-04 LAB — HCV INTERPRETATION

## 2017-10-04 LAB — FERRITIN: FERRITIN: 65 ng/mL (ref 15–150)

## 2017-10-04 LAB — LIPID PANEL
CHOLESTEROL TOTAL: 245 mg/dL — AB (ref 100–199)
Chol/HDL Ratio: 2.6 ratio (ref 0.0–4.4)
HDL: 96 mg/dL (ref >39)
LDL Calculated: 135 mg/dL — ABNORMAL HIGH (ref 0–99)
Triglycerides: 68 mg/dL (ref 0–149)
VLDL CHOLESTEROL CAL: 14 mg/dL (ref 5–40)

## 2017-10-04 LAB — HCV AB W/RFLX TO VERIFICATION: HCV Ab: 0.1 s/co ratio (ref 0.0–0.9)

## 2017-10-07 ENCOUNTER — Other Ambulatory Visit: Payer: Self-pay | Admitting: Family Medicine

## 2017-10-07 ENCOUNTER — Encounter: Payer: Self-pay | Admitting: Family Medicine

## 2017-10-07 DIAGNOSIS — I10 Essential (primary) hypertension: Secondary | ICD-10-CM

## 2017-10-07 MED ORDER — ATORVASTATIN CALCIUM 40 MG PO TABS: 40.0000 mg | ORAL_TABLET | Freq: Every day | ORAL | 3 refills | Status: DC

## 2017-10-07 NOTE — Addendum Note (Signed)
Addended by: Delia Chimes A on: 10/07/2017 10:36 AM   Modules accepted: Orders

## 2019-08-31 ENCOUNTER — Other Ambulatory Visit: Payer: Self-pay | Admitting: Emergency Medicine

## 2019-08-31 DIAGNOSIS — I10 Essential (primary) hypertension: Secondary | ICD-10-CM

## 2019-08-31 MED ORDER — LOSARTAN POTASSIUM 50 MG PO TABS: ORAL_TABLET | ORAL | 1 refills | Status: DC

## 2019-08-31 NOTE — Telephone Encounter (Signed)
losartan (COZAAR) 50 MG tablet     Patient is requesting refill.    Pharmacy:  [facility] [place], [place] Phone:  [phone]  Fax:  [phone]

## 2019-10-20 ENCOUNTER — Other Ambulatory Visit: Payer: Self-pay | Admitting: Emergency Medicine

## 2019-10-20 DIAGNOSIS — I10 Essential (primary) hypertension: Secondary | ICD-10-CM

## 2019-10-20 NOTE — Telephone Encounter (Signed)
Pt has appt 11/03/2019. Refilled meds to appt date

## 2019-11-03 ENCOUNTER — Other Ambulatory Visit: Payer: Self-pay

## 2019-11-03 ENCOUNTER — Encounter: Payer: Self-pay | Admitting: Emergency Medicine

## 2019-11-03 ENCOUNTER — Ambulatory Visit (INDEPENDENT_AMBULATORY_CARE_PROVIDER_SITE_OTHER): Payer: Medicaid Other | Admitting: Emergency Medicine

## 2019-11-03 VITALS — BP 190/110 | HR 67 | Temp 98.2°F | Resp 16 | Ht 59.0 in | Wt 131.0 lb

## 2019-11-03 DIAGNOSIS — E785 Hyperlipidemia, unspecified: Secondary | ICD-10-CM

## 2019-11-03 DIAGNOSIS — I10 Essential (primary) hypertension: Secondary | ICD-10-CM | POA: Diagnosis not present

## 2019-11-03 DIAGNOSIS — I7090 Unspecified atherosclerosis: Secondary | ICD-10-CM

## 2019-11-03 DIAGNOSIS — Z7689 Persons encountering health services in other specified circumstances: Secondary | ICD-10-CM

## 2019-11-03 DIAGNOSIS — Z23 Encounter for immunization: Secondary | ICD-10-CM | POA: Diagnosis not present

## 2019-11-03 DIAGNOSIS — G8114 Spastic hemiplegia affecting left nondominant side: Secondary | ICD-10-CM

## 2019-11-03 MED ORDER — AMLODIPINE BESYLATE 5 MG PO TABS: 5.0000 mg | ORAL_TABLET | Freq: Every day | ORAL | 3 refills | Status: DC

## 2019-11-03 MED ORDER — LOSARTAN POTASSIUM 50 MG PO TABS: 50.0000 mg | ORAL_TABLET | Freq: Every day | ORAL | 3 refills | Status: DC

## 2019-11-03 MED ORDER — ATORVASTATIN CALCIUM 40 MG PO TABS: 40.0000 mg | ORAL_TABLET | Freq: Every day | ORAL | 3 refills | Status: DC

## 2019-11-03 NOTE — Progress Notes (Signed)
Barbara Strickland 59 y.o.   Chief Complaint  Patient presents with  . Transitions Of Care    former Dr Nolon Rod  . Hypertension    and labs, medication refill - Amlodipine    HISTORY OF PRESENT ILLNESS: This is a 59 y.o. female here to establish care with me.  Used to see Dr. Nolon Rod. Has history of hypertension status post hemorrhagic stroke in 2009 and residual left spastic hemiparesis. Presently taking amlodipine 5 mg daily.  Supposed to be on losartan 50 mg daily but out of medication. Supposed to be on atorvastatin 40 mg daily but out of medication also. Has no complaints or medical concerns today. Daughter helping with translation.  The 10-year ASCVD risk score Mikey Bussing DC Brooke Bonito., et al., 2013) is: 7.2%   Values used to calculate the score:     Age: 60 years     Sex: Female     Is Non-Hispanic African American: No     Diabetic: No     Tobacco smoker: No     Systolic Blood Pressure: 161 mmHg     Is BP treated: Yes     HDL Cholesterol: 96 mg/dL     Total Cholesterol: 245 mg/dL   HPI   Prior to Admission medications   Medication Sig Start Date End Date Taking? Authorizing Provider  amLODipine (NORVASC) 5 MG tablet Take 1 tablet (5 mg total) by mouth daily. 10/03/17  Yes Stallings, Zoe A, MD  losartan (COZAAR) 50 MG tablet TAKE 1 TABLET(50 MG) BY MOUTH DAILY 10/20/19  Yes Kyl Givler, Ines Bloomer, MD  atorvastatin (LIPITOR) 40 MG tablet Take 1 tablet (40 mg total) by mouth daily. Patient not taking: Reported on 11/03/2019 10/07/17   Forrest Moron, MD  diclofenac sodium (VOLTAREN) 1 % GEL Apply 2 g topically 4 (four) times daily. 10/03/17   Forrest Moron, MD    No Known Allergies  Patient Active Problem List   Diagnosis Date Noted  . Hx of adenomatous colonic polyps 08/05/2017  . Left spastic hemiparesis ([place]) 05/15/2017  . Hemorrhagic stroke ([place]) 06/21/2015  . Essential hypertension 06/21/2015  . Medical non-compliance 06/21/2015    Past Medical History:  Diagnosis Date    . 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 Hemorrhagic stroke ([place])   . Hx of adenomatous colonic polyps 08/05/2017  . Hypertension   . Kidney stones    kidney stones   . Left spastic hemiparesis ([place])   . Microcytic anemia   . Stroke [facility])     History reviewed. No pertinent surgical history.  Social History   Socioeconomic History  . Marital status: Married    Spouse name: Not on file  . Number of children: 4  . Years of education: Not on file  . Highest education level: Not on file  Occupational History  . Not on file  Tobacco Use  . Smoking status: Former Smoker    Years: 3.00    Types: Cigarettes    Quit date: 02/06/2003    Years since quitting: 16.7  . Smokeless tobacco: Never Used  Vaping Use  . Vaping Use: Never used  Substance and Sexual Activity  . Alcohol use: No    Alcohol/week: 0.0 standard drinks  . Drug use: No  . Sexual activity: Not on file  Other Topics Concern  . Not on file  Social History Narrative  . Not on file   Social Determinants of Health   Financial Resource Strain:   . Difficulty of Paying Living Expenses:  Not on file  Food Insecurity:   . Worried About Charity fundraiser in the Last Year: Not on file  . Ran Out of Food in the Last Year: Not on file  Transportation Needs:   . Lack of Transportation (Medical): Not on file  . Lack of Transportation (Non-Medical): Not on file  Physical Activity:   . Days of Exercise per Week: Not on file  . Minutes of Exercise per Session: Not on file  Stress:   . Feeling of Stress : Not on file  Social Connections:   . Frequency of Communication with Friends and Family: Not on file  . Frequency of Social Gatherings with Friends and Family: Not on file  . Attends Religious Services: Not on file  . Active Member of Clubs or Organizations: Not on file  . Attends Archivist Meetings: Not on file  . Marital Status: Not on file  Intimate Partner Violence:   . Fear of Current or Ex-Partner: Not on file   . Emotionally Abused: Not on file  . Physically Abused: Not on file  . Sexually Abused: Not on file    Family History  Problem Relation Age of Onset  . Colon cancer Neg Hx   . Colon polyps Neg Hx   . Esophageal cancer Neg Hx   . Rectal cancer Neg Hx   . Stomach cancer Neg Hx      Review of Systems  Constitutional: Negative.  Negative for chills and fever.  HENT: Negative.  Negative for congestion and sore throat.   Respiratory: Negative.  Negative for cough and shortness of breath.   Cardiovascular: Negative.  Negative for chest pain and palpitations.  Gastrointestinal: Negative for abdominal pain, diarrhea, nausea and vomiting.  Genitourinary: Negative.  Negative for dysuria and hematuria.  Skin: Negative.  Negative for rash.  Neurological: Negative for dizziness and headaches.  All other systems reviewed and are negative.   Today's Vitals   11/03/19 1503  BP: (!) 172/95  Pulse: 67  Resp: 16  Temp: 98.2 F (36.8 C)  TempSrc: Temporal  SpO2: 97%  Weight: 131 lb (59.4 kg)  Height: 4\' 11"  (1.499 m)   Body mass index is 26.46 kg/m.  Physical Exam Vitals reviewed.  Constitutional:      Appearance: Normal appearance.  HENT:     Head: Normocephalic.  Eyes:     Extraocular Movements: Extraocular movements intact.     Pupils: Pupils are equal, round, and reactive to light.  Cardiovascular:     Rate and Rhythm: Normal rate and regular rhythm.     Pulses: Normal pulses.     Heart sounds: Normal heart sounds.  Pulmonary:     Effort: Pulmonary effort is normal.     Breath sounds: Normal breath sounds.  Musculoskeletal:     Cervical back: Normal range of motion and neck supple.  Skin:    General: Skin is warm and dry.  Neurological:     Mental Status: She is alert. Mental status is at baseline.     Comments: Left sided spastic hemiparesis  Psychiatric:        Mood and Affect: Mood normal.        Behavior: Behavior normal.      ASSESSMENT & PLAN: Ariahna was  seen today for transitions of care and hypertension.  Diagnoses and all orders for this visit:  Essential hypertension -     Comprehensive metabolic panel -     Hemoglobin A1c -  amLODipine (NORVASC) 5 MG tablet; Take 1 tablet (5 mg total) by mouth daily. -     losartan (COZAAR) 50 MG tablet; Take 1 tablet (50 mg total) by mouth daily.  Encounter to establish care  Left spastic hemiparesis (HCC)  Dyslipidemia -     Lipid panel -     atorvastatin (LIPITOR) 40 MG tablet; Take 1 tablet (40 mg total) by mouth daily.  Atherosclerotic vascular disease  Need for prophylactic vaccination and inoculation against influenza -     Flu Vaccine QUAD 36+ mos IM    Patient Instructions       If you have lab work done today you will be contacted with your lab results within the next 2 weeks.  If you have not heard from [place] then please contact us. The fastest way to get your results is to register for My Chart.   IF you received an x-ray today, you will receive an invoice from [facility]. Please contact [facility] at [phone] with questions or concerns regarding your invoice.   IF you received labwork today, you will receive an invoice from [facility]. Please contact [facility] at [phone] with questions or concerns regarding your invoice.   Our billing staff will not be able to assist you with questions regarding bills from these companies.  You will be contacted with the lab results as soon as they are available. The fastest way to get your results is to activate your My Chart account. Instructions are located on the last page of this paperwork. If you have not heard from [place] regarding the results in 2 weeks, please contact this office.     Hypertension, Adult High blood pressure (hypertension) is when the force of blood pumping through the arteries is too strong. The arteries are the blood vessels that carry blood from the heart throughout the body. Hypertension  forces the heart to work harder to pump blood and may cause arteries to become narrow or stiff. Untreated or uncontrolled hypertension can cause a heart attack, heart failure, a stroke, kidney disease, and other problems. A blood pressure reading consists of a higher number over a lower number. Ideally, your blood pressure should be below 120/80. The first ("top") number is called the systolic pressure. It is a measure of the pressure in your arteries as your heart beats. The second ("bottom") number is called the diastolic pressure. It is a measure of the pressure in your arteries as the heart relaxes. What are the causes? The exact cause of this condition is not known. There are some conditions that result in or are related to high blood pressure. What increases the risk? Some risk factors for high blood pressure are under your control. The following factors may make you more likely to develop this condition:  Smoking.  Having type 2 diabetes mellitus, high cholesterol, or both.  Not getting enough exercise or physical activity.  Being overweight.  Having too much fat, sugar, calories, or salt (sodium) in your diet.  Drinking too much alcohol. Some risk factors for high blood pressure may be difficult or impossible to change. Some of these factors include:  Having chronic kidney disease.  Having a family history of high blood pressure.  Age. Risk increases with age.  Race. You may be at higher risk if you are African American.  Gender. Men are at higher risk than women before age 36. After age 22, women are at higher risk than men.  Having obstructive sleep apnea.  Stress.  What are the signs or symptoms? High blood pressure may not cause symptoms. Very high blood pressure (hypertensive crisis) may cause:  Headache.  Anxiety.  Shortness of breath.  Nosebleed.  Nausea and vomiting.  Vision changes.  Severe chest pain.  Seizures. How is this diagnosed? This  condition is diagnosed by measuring your blood pressure while you are seated, with your arm resting on a flat surface, your legs uncrossed, and your feet flat on the floor. The cuff of the blood pressure monitor will be placed directly against the skin of your upper arm at the level of your heart. It should be measured at least twice using the same arm. Certain conditions can cause a difference in blood pressure between your right and left arms. Certain factors can cause blood pressure readings to be lower or higher than normal for a short period of time:  When your blood pressure is higher when you are in a health care provider's office than when you are at home, this is called white coat hypertension. Most people with this condition do not need medicines.  When your blood pressure is higher at home than when you are in a health care provider's office, this is called masked hypertension. Most people with this condition may need medicines to control blood pressure. If you have a high blood pressure reading during one visit or you have normal blood pressure with other risk factors, you may be asked to:  Return on a different day to have your blood pressure checked again.  Monitor your blood pressure at home for 1 week or longer. If you are diagnosed with hypertension, you may have other blood or imaging tests to help your health care provider understand your overall risk for other conditions. How is this treated? This condition is treated by making healthy lifestyle changes, such as eating healthy foods, exercising more, and reducing your alcohol intake. Your health care provider may prescribe medicine if lifestyle changes are not enough to get your blood pressure under control, and if:  Your systolic blood pressure is above 130.  Your diastolic blood pressure is above 80. Your personal target blood pressure may vary depending on your medical conditions, your age, and other factors. Follow these  instructions at home: Eating and drinking   Eat a diet that is high in fiber and potassium, and low in sodium, added sugar, and fat. An example eating plan is called the DASH (Dietary Approaches to Stop Hypertension) diet. To eat this way: ? Eat plenty of fresh fruits and vegetables. Try to fill one half of your plate at each meal with fruits and vegetables. ? Eat whole grains, such as whole-wheat pasta, brown rice, or whole-grain bread. Fill about one fourth of your plate with whole grains. ? Eat or drink low-fat dairy products, such as skim milk or low-fat yogurt. ? Avoid fatty cuts of meat, processed or cured meats, and poultry with skin. Fill about one fourth of your plate with lean proteins, such as fish, chicken without skin, beans, eggs, or tofu. ? Avoid pre-made and processed foods. These tend to be higher in sodium, added sugar, and fat.  Reduce your daily sodium intake. Most people with hypertension should eat less than 1,500 mg of sodium a day.  Do not drink alcohol if: ? Your health care provider tells you not to drink. ? You are pregnant, may be pregnant, or are planning to become pregnant.  If you drink alcohol: ? Limit how much you use to:  0-1 drink a day for women.  0-2 drinks a day for men. ? Be aware of how much alcohol is in your drink. In the [place] one drink equals one 12 oz bottle of beer (355 mL), one 5 oz glass of wine (148 mL), or one 1 oz glass of hard liquor (44 mL). Lifestyle   Work with your health care provider to maintain a healthy body weight or to lose weight. Ask what an ideal weight is for you.  Get at least 30 minutes of exercise most days of the week. Activities may include walking, swimming, or biking.  Include exercise to strengthen your muscles (resistance exercise), such as Pilates or lifting weights, as part of your weekly exercise routine. Try to do these types of exercises for 30 minutes at least 3 days a week.  Do not use any products  that contain nicotine or tobacco, such as cigarettes, e-cigarettes, and chewing tobacco. If you need help quitting, ask your health care provider.  Monitor your blood pressure at home as told by your health care provider.  Keep all follow-up visits as told by your health care provider. This is important. Medicines  Take over-the-counter and prescription medicines only as told by your health care provider. Follow directions carefully. Blood pressure medicines must be taken as prescribed.  Do not skip doses of blood pressure medicine. Doing this puts you at risk for problems and can make the medicine less effective.  Ask your health care provider about side effects or reactions to medicines that you should watch for. Contact a health care provider if you:  Think you are having a reaction to a medicine you are taking.  Have headaches that keep coming back (recurring).  Feel dizzy.  Have swelling in your ankles.  Have trouble with your vision. Get help right away if you:  Develop a severe headache or confusion.  Have unusual weakness or numbness.  Feel faint.  Have severe pain in your chest or abdomen.  Vomit repeatedly.  Have trouble breathing. Summary  Hypertension is when the force of blood pumping through your arteries is too strong. If this condition is not controlled, it may put you at risk for serious complications.  Your personal target blood pressure may vary depending on your medical conditions, your age, and other factors. For most people, a normal blood pressure is less than 120/80.  Hypertension is treated with lifestyle changes, medicines, or a combination of both. Lifestyle changes include losing weight, eating a healthy, low-sodium diet, exercising more, and limiting alcohol. This information is not intended to replace advice given to you by your health care provider. Make sure you discuss any questions you have with your health care provider. Document Revised:  10/02/2017 Document Reviewed: 10/02/2017 Elsevier Patient Education  2020 [employer]      Agustina Caroli, MD [facility]

## 2019-11-03 NOTE — Patient Instructions (Addendum)
   If you have lab work done today you will be contacted with your lab results within the next 2 weeks.  If you have not heard from us then please contact us. The fastest way to get your results is to register for My Chart.   IF you received an x-ray today, you will receive an invoice from [facility]. Please contact [facility] at [phone] with questions or concerns regarding your invoice.   IF you received labwork today, you will receive an invoice from [facility]. Please contact [facility] at [phone] with questions or concerns regarding your invoice.   Our billing staff will not be able to assist you with questions regarding bills from these companies.  You will be contacted with the lab results as soon as they are available. The fastest way to get your results is to activate your My Chart account. Instructions are located on the last page of this paperwork. If you have not heard from us regarding the results in 2 weeks, please contact this office.      Hypertension, Adult High blood pressure (hypertension) is when the force of blood pumping through the arteries is too strong. The arteries are the blood vessels that carry blood from the heart throughout the body. Hypertension forces the heart to work harder to pump blood and may cause arteries to become narrow or stiff. Untreated or uncontrolled hypertension can cause a heart attack, heart failure, a stroke, kidney disease, and other problems. A blood pressure reading consists of a higher number over a lower number. Ideally, your blood pressure should be below 120/80. The first ("top") number is called the systolic pressure. It is a measure of the pressure in your arteries as your heart beats. The second ("bottom") number is called the diastolic pressure. It is a measure of the pressure in your arteries as the heart relaxes. What are the causes? The exact cause of this condition is not known. There are some conditions  that result in or are related to high blood pressure. What increases the risk? Some risk factors for high blood pressure are under your control. The following factors may make you more likely to develop this condition:  Smoking.  Having type 2 diabetes mellitus, high cholesterol, or both.  Not getting enough exercise or physical activity.  Being overweight.  Having too much fat, sugar, calories, or salt (sodium) in your diet.  Drinking too much alcohol. Some risk factors for high blood pressure may be difficult or impossible to change. Some of these factors include:  Having chronic kidney disease.  Having a family history of high blood pressure.  Age. Risk increases with age.  Race. You may be at higher risk if you are African American.  Gender. Men are at higher risk than women before age 45. After age 65, women are at higher risk than men.  Having obstructive sleep apnea.  Stress. What are the signs or symptoms? High blood pressure may not cause symptoms. Very high blood pressure (hypertensive crisis) may cause:  Headache.  Anxiety.  Shortness of breath.  Nosebleed.  Nausea and vomiting.  Vision changes.  Severe chest pain.  Seizures. How is this diagnosed? This condition is diagnosed by measuring your blood pressure while you are seated, with your arm resting on a flat surface, your legs uncrossed, and your feet flat on the floor. The cuff of the blood pressure monitor will be placed directly against the skin of your upper arm at the level of your   heart. It should be measured at least twice using the same arm. Certain conditions can cause a difference in blood pressure between your right and left arms. Certain factors can cause blood pressure readings to be lower or higher than normal for a short period of time:  When your blood pressure is higher when you are in a health care provider's office than when you are at home, this is called white coat hypertension.  Most people with this condition do not need medicines.  When your blood pressure is higher at home than when you are in a health care provider's office, this is called masked hypertension. Most people with this condition may need medicines to control blood pressure. If you have a high blood pressure reading during one visit or you have normal blood pressure with other risk factors, you may be asked to:  Return on a different day to have your blood pressure checked again.  Monitor your blood pressure at home for 1 week or longer. If you are diagnosed with hypertension, you may have other blood or imaging tests to help your health care provider understand your overall risk for other conditions. How is this treated? This condition is treated by making healthy lifestyle changes, such as eating healthy foods, exercising more, and reducing your alcohol intake. Your health care provider may prescribe medicine if lifestyle changes are not enough to get your blood pressure under control, and if:  Your systolic blood pressure is above 130.  Your diastolic blood pressure is above 80. Your personal target blood pressure may vary depending on your medical conditions, your age, and other factors. Follow these instructions at home: Eating and drinking   Eat a diet that is high in fiber and potassium, and low in sodium, added sugar, and fat. An example eating plan is called the DASH (Dietary Approaches to Stop Hypertension) diet. To eat this way: ? Eat plenty of fresh fruits and vegetables. Try to fill one half of your plate at each meal with fruits and vegetables. ? Eat whole grains, such as whole-wheat pasta, brown rice, or whole-grain bread. Fill about one fourth of your plate with whole grains. ? Eat or drink low-fat dairy products, such as skim milk or low-fat yogurt. ? Avoid fatty cuts of meat, processed or cured meats, and poultry with skin. Fill about one fourth of your plate with lean proteins, such  as fish, chicken without skin, beans, eggs, or tofu. ? Avoid pre-made and processed foods. These tend to be higher in sodium, added sugar, and fat.  Reduce your daily sodium intake. Most people with hypertension should eat less than 1,500 mg of sodium a day.  Do not drink alcohol if: ? Your health care provider tells you not to drink. ? You are pregnant, may be pregnant, or are planning to become pregnant.  If you drink alcohol: ? Limit how much you use to:  0-1 drink a day for women.  0-2 drinks a day for men. ? Be aware of how much alcohol is in your drink. In the [place] one drink equals one 12 oz bottle of beer (355 mL), one 5 oz glass of wine (148 mL), or one 1 oz glass of hard liquor (44 mL). Lifestyle   Work with your health care provider to maintain a healthy body weight or to lose weight. Ask what an ideal weight is for you.  Get at least 30 minutes of exercise most days of the week. Activities may include walking, swimming,   or biking.  Include exercise to strengthen your muscles (resistance exercise), such as Pilates or lifting weights, as part of your weekly exercise routine. Try to do these types of exercises for 30 minutes at least 3 days a week.  Do not use any products that contain nicotine or tobacco, such as cigarettes, e-cigarettes, and chewing tobacco. If you need help quitting, ask your health care provider.  Monitor your blood pressure at home as told by your health care provider.  Keep all follow-up visits as told by your health care provider. This is important. Medicines  Take over-the-counter and prescription medicines only as told by your health care provider. Follow directions carefully. Blood pressure medicines must be taken as prescribed.  Do not skip doses of blood pressure medicine. Doing this puts you at risk for problems and can make the medicine less effective.  Ask your health care provider about side effects or reactions to medicines that you  should watch for. Contact a health care provider if you:  Think you are having a reaction to a medicine you are taking.  Have headaches that keep coming back (recurring).  Feel dizzy.  Have swelling in your ankles.  Have trouble with your vision. Get help right away if you:  Develop a severe headache or confusion.  Have unusual weakness or numbness.  Feel faint.  Have severe pain in your chest or abdomen.  Vomit repeatedly.  Have trouble breathing. Summary  Hypertension is when the force of blood pumping through your arteries is too strong. If this condition is not controlled, it may put you at risk for serious complications.  Your personal target blood pressure may vary depending on your medical conditions, your age, and other factors. For most people, a normal blood pressure is less than 120/80.  Hypertension is treated with lifestyle changes, medicines, or a combination of both. Lifestyle changes include losing weight, eating a healthy, low-sodium diet, exercising more, and limiting alcohol. This information is not intended to replace advice given to you by your health care provider. Make sure you discuss any questions you have with your health care provider. Document Revised: 10/02/2017 Document Reviewed: 10/02/2017 Elsevier Patient Education  2020 [employer]  

## 2019-11-03 NOTE — Assessment & Plan Note (Signed)
Uncontrolled hypertension.  Patient noncompliant with medications.  Only taking amlodipine 5 mg daily.  Will restart losartan 50 mg daily. Follow-up in 3 months.

## 2019-11-03 NOTE — Assessment & Plan Note (Signed)
Need to restart atorvastatin 40 mg daily.

## 2019-11-04 LAB — COMPREHENSIVE METABOLIC PANEL
ALT: 11 IU/L (ref 0–32)
AST: 19 IU/L (ref 0–40)
Albumin/Globulin Ratio: 1.5 (ref 1.2–2.2)
Albumin: 4.3 g/dL (ref 3.8–4.8)
Alkaline Phosphatase: 52 IU/L (ref 44–121)
BUN/Creatinine Ratio: 22 (ref 12–28)
BUN: 23 mg/dL (ref 8–27)
Bilirubin Total: 0.3 mg/dL (ref 0.0–1.2)
CO2: 21 mmol/L (ref 20–29)
Calcium: 8.8 mg/dL (ref 8.7–10.3)
Chloride: 105 mmol/L (ref 96–106)
Creatinine, Ser: 1.04 mg/dL — ABNORMAL HIGH (ref 0.57–1.00)
GFR calc Af Amer: 67 mL/min/{1.73_m2} (ref >59)
GFR calc non Af Amer: 58 mL/min/{1.73_m2} — ABNORMAL LOW (ref >59)
Globulin, Total: 2.9 g/dL (ref 1.5–4.5)
Glucose: 97 mg/dL (ref 65–99)
Potassium: 4 mmol/L (ref 3.5–5.2)
Sodium: 144 mmol/L (ref 134–144)
Total Protein: 7.2 g/dL (ref 6.0–8.5)

## 2019-11-04 LAB — LIPID PANEL
Chol/HDL Ratio: 2.6 ratio (ref 0.0–4.4)
Cholesterol, Total: 227 mg/dL — ABNORMAL HIGH (ref 100–199)
HDL: 87 mg/dL (ref >39)
LDL Chol Calc (NIH): 122 mg/dL — ABNORMAL HIGH (ref 0–99)
Triglycerides: 107 mg/dL (ref 0–149)
VLDL Cholesterol Cal: 18 mg/dL (ref 5–40)

## 2019-11-04 LAB — HEMOGLOBIN A1C
Est. average glucose Bld gHb Est-mCnc: 111 mg/dL
Hgb A1c MFr Bld: 5.5 % (ref 4.8–5.6)

## 2020-01-03 ENCOUNTER — Encounter (HOSPITAL_COMMUNITY)
Admission: EM | Disposition: A | Discharge status: Home / Self Care | Payer: Self-pay | Source: Home / Self Care | Attending: Family Medicine

## 2020-01-03 ENCOUNTER — Inpatient Hospital Stay (HOSPITAL_COMMUNITY)
Admission: EM | Admit: 2020-01-03 | Discharge: 2020-01-05 | DRG: 854 | Disposition: A | Discharge status: Home / Self Care | Payer: Medicaid Other | Attending: Family Medicine | Admitting: Family Medicine

## 2020-01-03 ENCOUNTER — Inpatient Hospital Stay (HOSPITAL_COMMUNITY): Payer: Medicaid Other

## 2020-01-03 ENCOUNTER — Emergency Department (HOSPITAL_COMMUNITY): Payer: Medicaid Other

## 2020-01-03 ENCOUNTER — Encounter (HOSPITAL_COMMUNITY): Payer: Self-pay

## 2020-01-03 ENCOUNTER — Emergency Department (HOSPITAL_COMMUNITY): Payer: Medicaid Other | Admitting: Certified Registered Nurse Anesthetist

## 2020-01-03 ENCOUNTER — Other Ambulatory Visit: Payer: Self-pay

## 2020-01-03 DIAGNOSIS — A419 Sepsis, unspecified organism: Secondary | ICD-10-CM | POA: Diagnosis present

## 2020-01-03 DIAGNOSIS — I10 Essential (primary) hypertension: Secondary | ICD-10-CM | POA: Diagnosis present

## 2020-01-03 DIAGNOSIS — N136 Pyonephrosis: Secondary | ICD-10-CM | POA: Diagnosis present

## 2020-01-03 DIAGNOSIS — N1330 Unspecified hydronephrosis: Secondary | ICD-10-CM | POA: Diagnosis present

## 2020-01-03 DIAGNOSIS — N12 Tubulo-interstitial nephritis, not specified as acute or chronic: Secondary | ICD-10-CM

## 2020-01-03 DIAGNOSIS — B9620 Unspecified Escherichia coli [E. coli] as the cause of diseases classified elsewhere: Secondary | ICD-10-CM | POA: Diagnosis present

## 2020-01-03 DIAGNOSIS — D696 Thrombocytopenia, unspecified: Secondary | ICD-10-CM | POA: Diagnosis present

## 2020-01-03 DIAGNOSIS — Q243 Pulmonary infundibular stenosis: Secondary | ICD-10-CM

## 2020-01-03 DIAGNOSIS — E785 Hyperlipidemia, unspecified: Secondary | ICD-10-CM | POA: Diagnosis present

## 2020-01-03 DIAGNOSIS — Z79899 Other long term (current) drug therapy: Secondary | ICD-10-CM | POA: Diagnosis not present

## 2020-01-03 DIAGNOSIS — N200 Calculus of kidney: Secondary | ICD-10-CM

## 2020-01-03 DIAGNOSIS — Z20822 Contact with and (suspected) exposure to covid-19: Secondary | ICD-10-CM | POA: Diagnosis present

## 2020-01-03 DIAGNOSIS — N3001 Acute cystitis with hematuria: Secondary | ICD-10-CM | POA: Diagnosis not present

## 2020-01-03 DIAGNOSIS — R739 Hyperglycemia, unspecified: Secondary | ICD-10-CM | POA: Diagnosis present

## 2020-01-03 DIAGNOSIS — Z87442 Personal history of urinary calculi: Secondary | ICD-10-CM | POA: Diagnosis not present

## 2020-01-03 DIAGNOSIS — N201 Calculus of ureter: Secondary | ICD-10-CM | POA: Diagnosis not present

## 2020-01-03 DIAGNOSIS — E876 Hypokalemia: Secondary | ICD-10-CM | POA: Diagnosis present

## 2020-01-03 DIAGNOSIS — N390 Urinary tract infection, site not specified: Secondary | ICD-10-CM | POA: Diagnosis not present

## 2020-01-03 DIAGNOSIS — R9431 Abnormal electrocardiogram [ECG] [EKG]: Secondary | ICD-10-CM | POA: Diagnosis not present

## 2020-01-03 HISTORY — PX: CYSTOSCOPY W/ URETERAL STENT PLACEMENT: SHX1429

## 2020-01-03 LAB — COMPREHENSIVE METABOLIC PANEL
ALT: 17 U/L (ref 0–44)
AST: 20 U/L (ref 15–41)
Albumin: 3.9 g/dL (ref 3.5–5.0)
Alkaline Phosphatase: 45 U/L (ref 38–126)
Anion gap: 10 (ref 5–15)
BUN: 20 mg/dL (ref 8–23)
CO2: 25 mmol/L (ref 22–32)
Calcium: 8.4 mg/dL — ABNORMAL LOW (ref 8.9–10.3)
Chloride: 103 mmol/L (ref 98–111)
Creatinine, Ser: 0.88 mg/dL (ref 0.44–1.00)
GFR, Estimated: >60 mL/min (ref >60)
Glucose, Bld: 108 mg/dL — ABNORMAL HIGH (ref 70–99)
Potassium: 3.3 mmol/L — ABNORMAL LOW (ref 3.5–5.1)
Sodium: 138 mmol/L (ref 135–145)
Total Bilirubin: 1 mg/dL (ref 0.3–1.2)
Total Protein: 7.9 g/dL (ref 6.5–8.1)

## 2020-01-03 LAB — BASIC METABOLIC PANEL
Anion gap: 11 (ref 5–15)
BUN: 15 mg/dL (ref 8–23)
CO2: 20 mmol/L — ABNORMAL LOW (ref 22–32)
Calcium: 7.9 mg/dL — ABNORMAL LOW (ref 8.9–10.3)
Chloride: 109 mmol/L (ref 98–111)
Creatinine, Ser: 0.83 mg/dL (ref 0.44–1.00)
GFR, Estimated: >60 mL/min (ref >60)
Glucose, Bld: 121 mg/dL — ABNORMAL HIGH (ref 70–99)
Potassium: 3.2 mmol/L — ABNORMAL LOW (ref 3.5–5.1)
Sodium: 140 mmol/L (ref 135–145)

## 2020-01-03 LAB — LACTIC ACID, PLASMA
Lactic Acid, Venous: 0.8 mmol/L (ref 0.5–1.9)
Lactic Acid, Venous: 1.5 mmol/L (ref 0.5–1.9)
Lactic Acid, Venous: 0.8 mmol/L (ref 0.5–1.9)

## 2020-01-03 LAB — CBC WITH DIFFERENTIAL/PLATELET
Abs Immature Granulocytes: 0.02 10*3/uL (ref 0.00–0.07)
Basophils Absolute: 0 10*3/uL (ref 0.0–0.1)
Basophils Relative: 0 %
Eosinophils Absolute: 0.1 10*3/uL (ref 0.0–0.5)
Eosinophils Relative: 1 %
HCT: 38 % (ref 36.0–46.0)
Hemoglobin: 11.6 g/dL — ABNORMAL LOW (ref 12.0–15.0)
Immature Granulocytes: 0 %
Lymphocytes Relative: 18 %
Lymphs Abs: 1.6 10*3/uL (ref 0.7–4.0)
MCH: 20.8 pg — ABNORMAL LOW (ref 26.0–34.0)
MCHC: 30.5 g/dL (ref 30.0–36.0)
MCV: 68.1 fL — ABNORMAL LOW (ref 80.0–100.0)
Monocytes Absolute: 0.8 10*3/uL (ref 0.1–1.0)
Monocytes Relative: 9 %
Neutro Abs: 6.3 10*3/uL (ref 1.7–7.7)
Neutrophils Relative %: 72 %
Platelets: 129 10*3/uL — ABNORMAL LOW (ref 150–400)
RBC: 5.58 MIL/uL — ABNORMAL HIGH (ref 3.87–5.11)
RDW: 15.8 % — ABNORMAL HIGH (ref 11.5–15.5)
WBC: 8.7 10*3/uL (ref 4.0–10.5)
nRBC: 0 % (ref 0.0–0.2)

## 2020-01-03 LAB — URINALYSIS, ROUTINE W REFLEX MICROSCOPIC
Bilirubin Urine: NEGATIVE
Glucose, UA: NEGATIVE mg/dL
Ketones, ur: NEGATIVE mg/dL
Nitrite: POSITIVE — AB
Protein, ur: 30 mg/dL — AB
Specific Gravity, Urine: 1.014 (ref 1.005–1.030)
WBC, UA: >50 WBC/hpf — ABNORMAL HIGH (ref 0–5)
pH: 6 (ref 5.0–8.0)

## 2020-01-03 LAB — APTT: aPTT: 35 seconds (ref 24–36)

## 2020-01-03 LAB — MAGNESIUM: Magnesium: 2 mg/dL (ref 1.7–2.4)

## 2020-01-03 LAB — PROTIME-INR
INR: 1 (ref 0.8–1.2)
Prothrombin Time: 13.2 seconds (ref 11.4–15.2)

## 2020-01-03 LAB — RESP PANEL BY RT-PCR (FLU A&B, COVID) ARPGX2
Influenza A by PCR: NEGATIVE
Influenza B by PCR: NEGATIVE
SARS Coronavirus 2 by RT PCR: NEGATIVE

## 2020-01-03 LAB — PROCALCITONIN: Procalcitonin: 0.1 ng/mL

## 2020-01-03 SURGERY — CYSTOSCOPY, WITH RETROGRADE PYELOGRAM AND URETERAL STENT INSERTION
Anesthesia: General | Site: Ureter | Laterality: Right

## 2020-01-03 MED ORDER — ACETAMINOPHEN 325 MG PO TABS: 650.0000 mg | ORAL_TABLET | Freq: Four times a day (QID) | ORAL | Status: DC | PRN

## 2020-01-03 MED ORDER — SODIUM CHLORIDE 0.9 % IV SOLN: 1.0000 g | INTRAVENOUS | Status: DC

## 2020-01-03 MED ORDER — FENTANYL CITRATE (PF) 100 MCG/2ML IJ SOLN
INTRAMUSCULAR | Status: DC | PRN
  Administered 2020-01-03: 50 ug via INTRAVENOUS

## 2020-01-03 MED ORDER — LACTATED RINGERS IV SOLN: INTRAVENOUS | Status: DC

## 2020-01-03 MED ORDER — SODIUM CHLORIDE 0.9 % IV SOLN
1.0000 g | INTRAVENOUS | Status: DC
  Administered 2020-01-04 – 2020-01-05 (×2): 1 g via INTRAVENOUS
  Filled 2020-01-03 (×2): qty 1

## 2020-01-03 MED ORDER — LABETALOL HCL 5 MG/ML IV SOLN
INTRAVENOUS | Status: AC
  Filled 2020-01-03: qty 4

## 2020-01-03 MED ORDER — LABETALOL HCL 5 MG/ML IV SOLN
INTRAVENOUS | Status: DC | PRN
  Administered 2020-01-03: 5 mg via INTRAVENOUS

## 2020-01-03 MED ORDER — SODIUM CHLORIDE 0.9 % IV BOLUS
1000.0000 mL | Freq: Once | INTRAVENOUS | Status: AC
  Administered 2020-01-03: 1000 mL via INTRAVENOUS

## 2020-01-03 MED ORDER — LACTATED RINGERS IV SOLN: INTRAVENOUS | Status: DC | PRN

## 2020-01-03 MED ORDER — ONDANSETRON HCL 4 MG/2ML IJ SOLN
INTRAMUSCULAR | Status: DC | PRN
  Administered 2020-01-03: 4 mg via INTRAVENOUS

## 2020-01-03 MED ORDER — SODIUM CHLORIDE 0.9 % IR SOLN
Status: DC | PRN
  Administered 2020-01-03: 3000 mL

## 2020-01-03 MED ORDER — LIDOCAINE 2% (20 MG/ML) 5 ML SYRINGE
INTRAMUSCULAR | Status: DC | PRN
  Administered 2020-01-03: 60 mg via INTRAVENOUS

## 2020-01-03 MED ORDER — IOHEXOL 300 MG/ML  SOLN
INTRAMUSCULAR | Status: DC | PRN
  Administered 2020-01-03: 20 mL

## 2020-01-03 MED ORDER — DEXAMETHASONE SODIUM PHOSPHATE 10 MG/ML IJ SOLN
INTRAMUSCULAR | Status: DC | PRN
  Administered 2020-01-03: 8 mg via INTRAVENOUS

## 2020-01-03 MED ORDER — ACETAMINOPHEN 650 MG RE SUPP: 650.0000 mg | Freq: Four times a day (QID) | RECTAL | Status: DC | PRN

## 2020-01-03 MED ORDER — MIDAZOLAM HCL 2 MG/2ML IJ SOLN
INTRAMUSCULAR | Status: AC
  Filled 2020-01-03: qty 2

## 2020-01-03 MED ORDER — DEXAMETHASONE SODIUM PHOSPHATE 10 MG/ML IJ SOLN
INTRAMUSCULAR | Status: AC
  Filled 2020-01-03: qty 1

## 2020-01-03 MED ORDER — ATORVASTATIN CALCIUM 40 MG PO TABS
40.0000 mg | ORAL_TABLET | Freq: Every day | ORAL | Status: DC
  Administered 2020-01-04 – 2020-01-05 (×2): 40 mg via ORAL
  Filled 2020-01-03 (×2): qty 1

## 2020-01-03 MED ORDER — FENTANYL CITRATE (PF) 100 MCG/2ML IJ SOLN: 25.0000 ug | INTRAMUSCULAR | Status: DC | PRN

## 2020-01-03 MED ORDER — ACETAMINOPHEN 325 MG PO TABS
650.0000 mg | ORAL_TABLET | Freq: Once | ORAL | Status: AC
  Administered 2020-01-03: 650 mg via ORAL
  Filled 2020-01-03: qty 2

## 2020-01-03 MED ORDER — HYDROCODONE-ACETAMINOPHEN 5-325 MG PO TABS
1.0000 | ORAL_TABLET | ORAL | Status: DC | PRN
  Administered 2020-01-03 – 2020-01-04 (×3): 1 via ORAL
  Filled 2020-01-03 (×4): qty 1

## 2020-01-03 MED ORDER — LACTATED RINGERS IV SOLN: INTRAVENOUS | Status: AC

## 2020-01-03 MED ORDER — SENNA 8.6 MG PO TABS
1.0000 | ORAL_TABLET | Freq: Two times a day (BID) | ORAL | Status: DC
  Administered 2020-01-03 – 2020-01-05 (×4): 8.6 mg via ORAL
  Filled 2020-01-03 (×4): qty 1

## 2020-01-03 MED ORDER — PROPOFOL 10 MG/ML IV BOLUS
INTRAVENOUS | Status: DC | PRN
  Administered 2020-01-03: 130 mg via INTRAVENOUS

## 2020-01-03 MED ORDER — MIDAZOLAM HCL 5 MG/5ML IJ SOLN
INTRAMUSCULAR | Status: DC | PRN
  Administered 2020-01-03: 1 mg via INTRAVENOUS

## 2020-01-03 MED ORDER — GENTAMICIN SULFATE 40 MG/ML IJ SOLN
5.0000 mg/kg | INTRAVENOUS | Status: DC
  Administered 2020-01-03: 280 mg via INTRAVENOUS
  Filled 2020-01-03: qty 7

## 2020-01-03 MED ORDER — FENTANYL CITRATE (PF) 100 MCG/2ML IJ SOLN
INTRAMUSCULAR | Status: AC
  Filled 2020-01-03: qty 2

## 2020-01-03 MED ORDER — PROPOFOL 10 MG/ML IV BOLUS
INTRAVENOUS | Status: AC
  Filled 2020-01-03: qty 20

## 2020-01-03 MED ORDER — SODIUM CHLORIDE 0.9 % IV SOLN
1.0000 g | Freq: Once | INTRAVENOUS | Status: AC
  Administered 2020-01-03: 1 g via INTRAVENOUS
  Filled 2020-01-03: qty 10

## 2020-01-03 MED ORDER — POTASSIUM CHLORIDE 10 MEQ/100ML IV SOLN
10.0000 meq | INTRAVENOUS | Status: AC
  Administered 2020-01-03 – 2020-01-04 (×4): 10 meq via INTRAVENOUS
  Filled 2020-01-03 (×4): qty 100

## 2020-01-03 MED ORDER — ONDANSETRON HCL 4 MG/2ML IJ SOLN: 4.0000 mg | Freq: Once | INTRAMUSCULAR | Status: DC | PRN

## 2020-01-03 MED ORDER — POLYETHYLENE GLYCOL 3350 17 G PO PACK: 17.0000 g | PACK | Freq: Every day | ORAL | Status: DC | PRN

## 2020-01-03 MED ORDER — LIDOCAINE HCL (PF) 2 % IJ SOLN
INTRAMUSCULAR | Status: AC
  Filled 2020-01-03: qty 5

## 2020-01-03 MED ORDER — ONDANSETRON HCL 4 MG/2ML IJ SOLN
INTRAMUSCULAR | Status: AC
  Filled 2020-01-03: qty 2

## 2020-01-03 MED ORDER — SODIUM CHLORIDE 0.9 % IV SOLN: INTRAVENOUS | Status: DC

## 2020-01-03 MED ORDER — HYDRALAZINE HCL 20 MG/ML IJ SOLN: 10.0000 mg | Freq: Four times a day (QID) | INTRAMUSCULAR | Status: DC | PRN

## 2020-01-03 SURGICAL SUPPLY — 14 items
BAG URO CATCHER STRL LF (MISCELLANEOUS) ×3 IMPLANT
CATH URET 5FR 28IN OPEN ENDED (CATHETERS) ×3 IMPLANT
CLOTH BEACON ORANGE TIMEOUT ST (SAFETY) ×3 IMPLANT
GLOVE BIOGEL M STRL SZ7.5 (GLOVE) ×3 IMPLANT
GOWN STRL REUS W/TWL XL LVL3 (GOWN DISPOSABLE) ×3 IMPLANT
GUIDEWIRE STR DUAL SENSOR (WIRE) IMPLANT
GUIDEWIRE ZIPWRE .038 STRAIGHT (WIRE) ×3 IMPLANT
KIT TURNOVER KIT A (KITS) IMPLANT
MANIFOLD NEPTUNE II (INSTRUMENTS) ×3 IMPLANT
PACK CYSTO (CUSTOM PROCEDURE TRAY) ×3 IMPLANT
STENT URETERAL METAL 6X24 (STENTS) ×3 IMPLANT
TRAY FOLEY MTR SLVR 16FR STAT (SET/KITS/TRAYS/PACK) ×3 IMPLANT
TUBING CONNECTING 10 (TUBING) ×2 IMPLANT
TUBING CONNECTING 10' (TUBING) ×1

## 2020-01-03 NOTE — Anesthesia Preprocedure Evaluation (Addendum)
Anesthesia Evaluation  Patient identified by MRN, date of birth, ID band Patient awake    Reviewed: Patient's Chart, lab work & pertinent test results  Airway Mallampati: II  TM Distance: >3 FB Neck ROM: Full    Dental  (+) Teeth Intact   Pulmonary neg pulmonary ROS,    Pulmonary exam normal        Cardiovascular hypertension, Pt. on medications  Rhythm:Regular Rate:Normal     Neuro/Psych negative neurological ROS  negative psych ROS   GI/Hepatic negative GI ROS, Neg liver ROS,   Endo/Other  negative endocrine ROS  Renal/GU Right ureteral obstruction Bladder dysfunction      Musculoskeletal negative musculoskeletal ROS (+)   Abdominal (+)  Abdomen: soft. Bowel sounds: normal.  Peds  Hematology negative hematology ROS (+)   Anesthesia Other Findings   Reproductive/Obstetrics negative OB ROS                            Anesthesia Physical Anesthesia Plan  ASA: II and emergent  Anesthesia Plan: General   Post-op Pain Management:    Induction:   PONV Risk Score and Plan: 2 and Ondansetron, Dexamethasone and Treatment may vary due to age or medical condition  Airway Management Planned: Mask and LMA  Additional Equipment: None  Intra-op Plan:   Post-operative Plan: Extubation in OR  Informed Consent: I have reviewed the patients History and Physical, chart, labs and discussed the procedure including the risks, benefits and alternatives for the proposed anesthesia with the patient or authorized representative who has indicated his/her understanding and acceptance.     Dental advisory given  Plan Discussed with: CRNA  Anesthesia Plan Comments: (Lab Results      Component                Value               Date                      WBC                      8.7                 01/03/2020                HGB                      11.6 (L)            01/03/2020                 HCT                      38.0                01/03/2020                MCV                      68.1 (L)            01/03/2020                PLT                      129 (L)  01/03/2020          )        Anesthesia Quick Evaluation

## 2020-01-03 NOTE — Op Note (Signed)
Operative Note  Preoperative diagnosis:  1.  Sepsis secondary to obstructing 4 mm right distal ureteral calculus 2.  Right staghorn calculi  Postoperative diagnosis: 1.  Sepsis secondary to obstructing 4 mm right distal ureteral calculus 2.  Right staghorn calculi 3.  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  Cystoscopy with right JJ stent placement 2.  Right retrograde pyelogram with intraoperative interpretation of fluoroscopic imaging  Surgeon: Ellison Hughs, MD  Assistants:  None  Anesthesia:  General  Complications:  None  EBL: Less than 5 mL  Specimens: 1.  None  Drains/Catheters: 1.  Right 6 French, 24 cm JJ stent without tether 2.  16 French Foley catheter with 10 mL of sterile water in the balloon  Intraoperative findings:   1.  Right retrograde pyelogram revealed a filling defect within the distal aspects of the right ureter along with uniform dilation of the proximal aspects of the right ureter.  Renogram revealed a stenotic infundibulum involving the right upper pole calyx that contained multiple large calculi.  There were no other filling defects seen within the right renal pelvis   Indication:  Barbara Strickland is a 59 y.o. female with sepsis secondary to an obstructing 4 mm right distal ureteral calculus.  She has been consented for the above procedures, voices understanding and wishes to proceed.  Description of procedure:  After informed consent was obtained, the patient was brought to the operating room and general LMA anesthesia was administered. The patient was then placed in the dorsolithotomy position and prepped and draped in the usual sterile fashion. A timeout was performed. A 23 French rigid cystoscope was then inserted into the urethral meatus and advanced into the bladder under direct vision. A complete bladder survey revealed no intravesical pathology.  A 5 French ureteral catheter was then  inserted into the right ureteral orifice and a retrograde pyelogram was obtained, with the findings listed above.  A Glidewire was then used to intubate the lumen of the ureteral catheter and was advanced up to the right renal pelvis, under fluoroscopic guidance.  The catheter was then removed, leaving the wire in place.  A 6 French, 24 cm JJ stent was then advanced over the wire and into good position within the right collecting system, confirming placement via fluoroscopy.   A 16 French Foley catheter was then inserted and placed to gravity drainage.  The patient tolerated the procedure well and was transferred to the postanesthesia in stable condition.  Plan: Monitor overnight.  Okay to remove Foley catheter tomorrow morning.  She will need definitive treatment of her distal ureteral calculus in the next 2 to 3 weeks.  Due to the stenosis of the right upper pole infundibulum, I do not believe that the upper pole stone burden will be accessible via PCNL.

## 2020-01-03 NOTE — Anesthesia Postprocedure Evaluation (Signed)
Anesthesia Post Note  Patient: Barbara Strickland  Procedure(s) Performed: CYSTOSCOPY WITH RETROGRADE PYELOGRAM/URETERAL STENT PLACEMENT (Right Ureter)     Patient location during evaluation: PACU Anesthesia Type: General Level of consciousness: awake and alert Pain management: pain level controlled Vital Signs Assessment: post-procedure vital signs reviewed and stable Respiratory status: spontaneous breathing, nonlabored ventilation, respiratory function stable and patient connected to nasal cannula oxygen Cardiovascular status: blood pressure returned to baseline and stable Postop Assessment: no apparent nausea or vomiting Anesthetic complications: no   No complications documented.  Last Vitals:  Vitals:   01/03/20 2045 01/03/20 2306  BP: (!) 162/89 124/81  Pulse: 65 (!) 59  Resp: 17 16  Temp: 37 C 36.8 C  SpO2: 97% 97%    Last Pain:  Vitals:   01/03/20 2306  TempSrc: Oral  PainSc:                  March Rummage Latesha Chesney

## 2020-01-03 NOTE — Progress Notes (Signed)
Pharmacy Antibiotic Note  Barbara Strickland is a 59 y.o. female presented to the ED on 01/03/2020 with dysuria and increase frequency. CT renal stone study showed multiple kidney stones.  Pharmacy is consulted to dose gentamicin for suspected UTI.  Antimicrobials this admission:  11/28 CTX x1 11/28 gent>>  Microbiology results:  11/28 BCx x2:  11/28 UCx: 11/28 UA: many bact, wbc >50, large leuk, nitrite +   - scr 0.88 (crcl~58)  Plan: - gentamicin 5mg /kg q24h (extended interval, using adjusted body weight) - monitor renal function _________________________________________  Height: 5' (152.4 cm) Weight: 70.3 kg (155 lb) IBW/kg (Calculated) : 45.5  Temp (24hrs), Avg:99.6 F (37.6 C), Min:98.1 F (36.7 C), Max:101.1 F (38.4 C)  Recent Labs  Lab 01/03/20 1430  WBC 8.7  CREATININE 0.88  LATICACIDVEN 0.8    Estimated Creatinine Clearance: 58.7 mL/min (by C-G formula based on SCr of 0.88 mg/dL).    No Known Allergies   Thank you for allowing pharmacy to be a part of this patient's care.  Lynelle Doctor 01/03/2020 6:20 PM

## 2020-01-03 NOTE — ED Provider Notes (Signed)
I assumed care of patient at shift change, please see note by previous team for full H&P.  Briefly patient is a 59 year old woman who presents today for evaluation of 4 days of dysuria, urgency, and frequency.  She has had right-sided flank/abdominal pain.  She states this feels similar to prior kidney stones. Physical Exam  BP (!) 182/95   Pulse 69   Temp (!) 101.1 F (38.4 C) (Oral)   Resp 18   Ht 5' (1.524 m)   Wt 70.3 kg   SpO2 99%   BMI 30.27 kg/m   Patient is awake and alert.  She is lying in bed.  She does not have obvious slurred speech.  Her daughter interprets per her request.  She is not in obvious distress.  ED Course/Procedures      .Critical Care Performed by: Lorin Glass, PA-C Authorized by: Lorin Glass, PA-C   Critical care provider statement:    Critical care time (minutes):  45   Critical care was necessary to treat or prevent imminent or life-threatening deterioration of the following conditions:  Sepsis   Critical care was time spent personally by me on the following activities:  Discussions with consultants, evaluation of patient's response to treatment, examination of patient, ordering and performing treatments and interventions, ordering and review of laboratory studies, ordering and review of radiographic studies, pulse oximetry, re-evaluation of patient's condition, obtaining history from patient or surrogate and review of old charts    CT Renal Stone Study  Result Date: 01/03/2020 CLINICAL DATA:  Flank pain.  Fever.  Evaluate for kidney stone. EXAM: CT ABDOMEN AND PELVIS WITHOUT CONTRAST TECHNIQUE: Multidetector CT imaging of the abdomen and pelvis was performed following the standard protocol without IV contrast. COMPARISON:  None. FINDINGS: Lower chest: No acute abnormality. Hepatobiliary: No focal liver abnormality identified. Small stones noted within the dependent portion of the gallbladder measuring up to 4 mm. No signs of gallbladder  wall inflammation or bile duct dilatation. Pancreas: Unremarkable. No pancreatic ductal dilatation or surrounding inflammatory changes. Spleen: Normal in size without focal abnormality. Adrenals/Urinary Tract: There are multiple calcifications within the posterior and lateral upper pole calices of the right kidney. Central right upper pole stone measures 2.4 cm. Right-sided hydronephrosis and hydroureter noted. At the right UVJ there is a small stone measuring 4 mm, image 74/2. No left renal calculi or left-sided hydronephrosis or hydroureter. The left renal collecting system appears duplicated. Stomach/Bowel: Stomach is within normal limits. Appendix appears normal. No evidence of bowel wall thickening, distention, or inflammatory changes. Vascular/Lymphatic: Normal appearance of the abdominal aorta. No aneurysm. No abdominopelvic adenopathy identified. Reproductive: Uterus and bilateral adnexa are unremarkable. Other: No free fluid or fluid collections Musculoskeletal: No acute or significant osseous findings. IMPRESSION: 1. Right-sided hydronephrosis and hydroureter secondary to 4 mm right UVJ calculus. 2. Multiple large upper pole right renal calculi. 3. Gallstones. Electronically Signed   By: Kerby Moors M.D.   On: 01/03/2020 15:27    MDM  Here patient was noted to be febrile with a temp of 101.1.  She does not have a significant leukocytosis.  She is not consistently tachycardic however she is tachypneic up to 26 qualifying her for sepsis criteria.  Her urine appears infected with only 0-5 squamous epithelial cells, over 50 white cells with many bacteria and 21-50 red cells.  Her CT scan shows right-sided hydronephrosis and hydroureter secondary to a 4 mm right UVJ calculus with multiple large upper pole right renal calculi and gallstones.  I spoke with urology Dr. Gilford Rile who will take patient to the OR. Covid test is negative.  I spoke with Dr. Roel Cluck who will see patient for admission.  Note:  Portions of this report may have been transcribed using voice recognition software. Every effort was made to ensure accuracy; however, inadvertent computerized transcription errors may be present        Ollen Gross 01/03/20 2326    Tegeler, Gwenyth Allegra, MD 01/05/20 1326

## 2020-01-03 NOTE — Consult Note (Addendum)
Urology Consult   Physician requesting consult: Marda Stalker, MD  Reason for consult: Right ureteral stone with sepsis  History of Present Illness: Barbara Strickland is a 59 y.o. female who presented to the [facility] ED with a 3 day history of sharp, intermittent, non-radiating right flank pain associated fever of 101F, urinary urgency and chills.  CTSS from today revealed a 4 mm right distal ureteral stone with a staghorn right renal stone.    She has a long history of passing kidney stones, but has never required surgery.  The patient speaks [place] only.  Her daughter is at bedside and acting as an interpreter.   Past Medical History:  Diagnosis Date  . Hypertension     History reviewed. No pertinent surgical history.  Current Hospital Medications:  Home Meds:  Current Meds  Medication Sig  . amLODipine (NORVASC) 5 MG tablet Take 5 mg by mouth daily.  Marland Kitchen atorvastatin (LIPITOR) 40 MG tablet Take 40 mg by mouth daily.  Marland Kitchen losartan (COZAAR) 50 MG tablet Take 50 mg by mouth daily.    Scheduled Meds: Continuous Infusions: . lactated ringers 150 mL/hr at 01/03/20 1700   PRN Meds:.  Allergies: No Known Allergies  No family history on file.  Social History:  reports that she has never smoked. She has never used smokeless tobacco. She reports that she does not drink alcohol and does not use drugs.  ROS: A complete review of systems was performed.  All systems are negative except for pertinent findings as noted.  Physical Exam:  Vital signs in last 24 hours: Temp:  [98.1 F (36.7 C)-101.1 F (38.4 C)] 98.1 F (36.7 C) (11/28 1757) Pulse Rate:  [65-98] 69 (11/28 1730) Resp:  [17-26] 18 (11/28 1730) BP: (169-199)/(95-124) 182/95 (11/28 1730) SpO2:  [97 %-100 %] 99 % (11/28 1730) Weight:  [70.3 kg] 70.3 kg (11/28 1330) Constitutional:  Alert and oriented, No acute distress Cardiovascular: Regular rate and rhythm, No JVD Respiratory: Normal respiratory effort, Lungs clear  bilaterally GI: Abdomen is soft, nontender, nondistended, no abdominal masses GU: No CVA tenderness Lymphatic: No lymphadenopathy Neurologic: Grossly intact, no focal deficits Psychiatric: Normal mood and affect  Laboratory Data:  Recent Labs    01/03/20 1430  WBC 8.7  HGB 11.6*  HCT 38.0  PLT 129*    Recent Labs    01/03/20 1430  NA 138  K 3.3*  CL 103  GLUCOSE 108*  BUN 20  CALCIUM 8.4*  CREATININE 0.88     Results for orders placed or performed during the hospital encounter of 01/03/20 (from the past 24 hour(s))  Urinalysis, Routine w reflex microscopic Urine, Clean Catch     Status: Abnormal   Collection Time: 01/03/20  1:31 PM  Result Value Ref Range   Color, Urine YELLOW YELLOW   APPearance CLOUDY (A) CLEAR   Specific Gravity, Urine 1.014 1.005 - 1.030   pH 6.0 5.0 - 8.0   Glucose, UA NEGATIVE NEGATIVE mg/dL   Hgb urine dipstick MODERATE (A) NEGATIVE   Bilirubin Urine NEGATIVE NEGATIVE   Ketones, ur NEGATIVE NEGATIVE mg/dL   Protein, ur 30 (A) NEGATIVE mg/dL   Nitrite POSITIVE (A) NEGATIVE   Leukocytes,Ua LARGE (A) NEGATIVE   RBC / HPF 21-50 0 - 5 RBC/hpf   WBC, UA >50 (H) 0 - 5 WBC/hpf   Bacteria, UA MANY (A) NONE SEEN   Squamous Epithelial / LPF 0-5 0 - 5   WBC Clumps PRESENT   CBC with Differential  Status: Abnormal   Collection Time: 01/03/20  2:30 PM  Result Value Ref Range   WBC 8.7 4.0 - 10.5 K/uL   RBC 5.58 (H) 3.87 - 5.11 MIL/uL   Hemoglobin 11.6 (L) 12.0 - 15.0 g/dL   HCT 38.0 36 - 46 %   MCV 68.1 (L) 80.0 - 100.0 fL   MCH 20.8 (L) 26.0 - 34.0 pg   MCHC 30.5 30.0 - 36.0 g/dL   RDW 15.8 (H) 11.5 - 15.5 %   Platelets 129 (L) 150 - 400 K/uL   nRBC 0.0 0.0 - 0.2 %   Neutrophils Relative % 72 %   Neutro Abs 6.3 1.7 - 7.7 K/uL   Lymphocytes Relative 18 %   Lymphs Abs 1.6 0.7 - 4.0 K/uL   Monocytes Relative 9 %   Monocytes Absolute 0.8 0.1 - 1.0 K/uL   Eosinophils Relative 1 %   Eosinophils Absolute 0.1 0.0 - 0.5 K/uL   Basophils  Relative 0 %   Basophils Absolute 0.0 0.0 - 0.1 K/uL   Immature Granulocytes 0 %   Abs Immature Granulocytes 0.02 0.00 - 0.07 K/uL   Ovalocytes PRESENT   Comprehensive metabolic panel     Status: Abnormal   Collection Time: 01/03/20  2:30 PM  Result Value Ref Range   Sodium 138 135 - 145 mmol/L   Potassium 3.3 (L) 3.5 - 5.1 mmol/L   Chloride 103 98 - 111 mmol/L   CO2 25 22 - 32 mmol/L   Glucose, Bld 108 (H) 70 - 99 mg/dL   BUN 20 8 - 23 mg/dL   Creatinine, Ser 0.88 0.44 - 1.00 mg/dL   Calcium 8.4 (L) 8.9 - 10.3 mg/dL   Total Protein 7.9 6.5 - 8.1 g/dL   Albumin 3.9 3.5 - 5.0 g/dL   AST 20 15 - 41 U/L   ALT 17 0 - 44 U/L   Alkaline Phosphatase 45 38 - 126 U/L   Total Bilirubin 1.0 0.3 - 1.2 mg/dL   GFR, Estimated >60 >60 mL/min   Anion gap 10 5 - 15  Lactic acid, plasma     Status: None   Collection Time: 01/03/20  2:30 PM  Result Value Ref Range   Lactic Acid, Venous 0.8 0.5 - 1.9 mmol/L  Protime-INR     Status: None   Collection Time: 01/03/20  2:30 PM  Result Value Ref Range   Prothrombin Time 13.2 11.4 - 15.2 seconds   INR 1.0 0.8 - 1.2  APTT     Status: None   Collection Time: 01/03/20  2:30 PM  Result Value Ref Range   aPTT 35 24 - 36 seconds  Resp Panel by RT-PCR (Flu A&B, Covid) Nasopharyngeal Swab     Status: None   Collection Time: 01/03/20  3:28 PM   Specimen: Nasopharyngeal Swab; Nasopharyngeal(NP) swabs in vial transport medium  Result Value Ref Range   SARS Coronavirus 2 by RT PCR NEGATIVE NEGATIVE   Influenza A by PCR NEGATIVE NEGATIVE   Influenza B by PCR NEGATIVE NEGATIVE   Recent Results (from the past 240 hour(s))  Resp Panel by RT-PCR (Flu A&B, Covid) Nasopharyngeal Swab     Status: None   Collection Time: 01/03/20  3:28 PM   Specimen: Nasopharyngeal Swab; Nasopharyngeal(NP) swabs in vial transport medium  Result Value Ref Range Status   SARS Coronavirus 2 by RT PCR NEGATIVE NEGATIVE Final    Comment: (NOTE) SARS-CoV-2 target nucleic acids are  NOT DETECTED.  The SARS-CoV-2 RNA  is generally detectable in upper respiratory specimens during the acute phase of infection. The lowest concentration of SARS-CoV-2 viral copies this assay can detect is 138 copies/mL. A negative result does not preclude SARS-Cov-2 infection and should not be used as the sole basis for treatment or other patient management decisions. A negative result may occur with  improper specimen collection/handling, submission of specimen other than nasopharyngeal swab, presence of viral mutation(s) within the areas targeted by this assay, and inadequate number of viral copies(<138 copies/mL). A negative result must be combined with clinical observations, patient history, and epidemiological information. The expected result is Negative.  Fact Sheet for Patients:  EntrepreneurPulse.com.au  Fact Sheet for Healthcare Providers:  IncredibleEmployment.be  This test is no t yet approved or cleared by the [place] FDA and  has been authorized for detection and/or diagnosis of SARS-CoV-2 by FDA under an Emergency Use Authorization (EUA). This EUA will remain  in effect (meaning this test can be used) for the duration of the COVID-19 declaration under Section 564(b)(1) of the Act, 21 U.S.C.section 360bbb-3(b)(1), unless the authorization is terminated  or revoked sooner.       Influenza A by PCR NEGATIVE NEGATIVE Final   Influenza B by PCR NEGATIVE NEGATIVE Final    Comment: (NOTE) The Xpert Xpress SARS-CoV-2/FLU/RSV plus assay is intended as an aid in the diagnosis of influenza from Nasopharyngeal swab specimens and should not be used as a sole basis for treatment. Nasal washings and aspirates are unacceptable for Xpert Xpress SARS-CoV-2/FLU/RSV testing.  Fact Sheet for Patients: EntrepreneurPulse.com.au  Fact Sheet for Healthcare Providers: IncredibleEmployment.be  This test is not  yet approved or cleared by the [place] FDA and has been authorized for detection and/or diagnosis of SARS-CoV-2 by FDA under an Emergency Use Authorization (EUA). This EUA will remain in effect (meaning this test can be used) for the duration of the COVID-19 declaration under Section 564(b)(1) of the Act, 21 U.S.C. section 360bbb-3(b)(1), unless the authorization is terminated or revoked.  Performed at [facility], [place] [address], [place], [place] [address]     Renal Function: Recent Labs    01/03/20 1430  CREATININE 0.88   Estimated Creatinine Clearance: 58.7 mL/min (by C-G formula based on SCr of 0.88 mg/dL).  Radiologic Imaging: CT Renal Stone Study  Result Date: 01/03/2020 CLINICAL DATA:  Flank pain.  Fever.  Evaluate for kidney stone. EXAM: CT ABDOMEN AND PELVIS WITHOUT CONTRAST TECHNIQUE: Multidetector CT imaging of the abdomen and pelvis was performed following the standard protocol without IV contrast. COMPARISON:  None. FINDINGS: Lower chest: No acute abnormality. Hepatobiliary: No focal liver abnormality identified. Small stones noted within the dependent portion of the gallbladder measuring up to 4 mm. No signs of gallbladder wall inflammation or bile duct dilatation. Pancreas: Unremarkable. No pancreatic ductal dilatation or surrounding inflammatory changes. Spleen: Normal in size without focal abnormality. Adrenals/Urinary Tract: There are multiple calcifications within the posterior and lateral upper pole calices of the right kidney. Central right upper pole stone measures 2.4 cm. Right-sided hydronephrosis and hydroureter noted. At the right UVJ there is a small stone measuring 4 mm, image 74/2. No left renal calculi or left-sided hydronephrosis or hydroureter. The left renal collecting system appears duplicated. Stomach/Bowel: Stomach is within normal limits. Appendix appears normal. No evidence of bowel wall thickening, distention, or inflammatory  changes. Vascular/Lymphatic: Normal appearance of the abdominal aorta. No aneurysm. No abdominopelvic adenopathy identified. Reproductive: Uterus and bilateral adnexa are unremarkable. Other: No free fluid or  fluid collections Musculoskeletal: No acute or significant osseous findings. IMPRESSION: 1. Right-sided hydronephrosis and hydroureter secondary to 4 mm right UVJ calculus. 2. Multiple large upper pole right renal calculi. 3. Gallstones. Electronically Signed   By: Kerby Moors M.D.   On: 01/03/2020 15:27    I independently reviewed the above imaging studies.  Impression/Recommendation 59 year old female with sepsis 2/2 an obstructing 4 mm right distal ureteral stone and right staghorn stone  -The risks, benefits and alternatives of cystoscopy with RIGHT JJ stent placement was discussed with the patient.  Risks include, but are not limited to: bleeding, urinary tract infection, ureteral injury, ureteral stricture disease, chronic pain, urinary symptoms, bladder injury, stent migration, the need for nephrostomy tube placement, MI, CVA, DVT, PE and the inherent risks with general anesthesia.  The patient voices understanding and wishes to proceed. -Her right staghorn calculus and right ureteral calculus will need to be addressed in the next 4-6 weeks, once her UTI has cleared.    Ellison Hughs, MD [facility] 01/03/2020, 6:00 PM

## 2020-01-03 NOTE — Anesthesia Procedure Notes (Signed)
Procedure Name: LMA Insertion Date/Time: 01/03/2020 7:10 PM Performed by: Montel Clock, CRNA Pre-anesthesia Checklist: Patient identified, Emergency Drugs available, Suction available, Patient being monitored and Timeout performed Patient Re-evaluated:Patient Re-evaluated prior to induction Oxygen Delivery Method: Circle system utilized Preoxygenation: Pre-oxygenation with 100% oxygen Induction Type: IV induction LMA: LMA with gastric port inserted LMA Size: 4.0 Number of attempts: 1 Dental Injury: Teeth and Oropharynx as per pre-operative assessment

## 2020-01-03 NOTE — Transfer of Care (Signed)
2Immediate Anesthesia Transfer of Care Note  Patient: Barbara Strickland  Procedure(s) Performed: CYSTOSCOPY WITH RETROGRADE PYELOGRAM/URETERAL STENT PLACEMENT (Right Ureter)  Patient Location: PACU  Anesthesia Type:General  Level of Consciousness: drowsy and patient cooperative  Airway & Oxygen Therapy: Patient Spontanous Breathing and Patient connected to face mask oxygen  Post-op Assessment: Report given to RN and Post -op Vital signs reviewed and stable  Post vital signs: Reviewed and stable  Last Vitals:  Vitals Value Taken Time  BP    Temp    Pulse    Resp    SpO2      Last Pain:  Vitals:   01/03/20 1757  TempSrc: Oral  PainSc:          Complications: No complications documented.

## 2020-01-03 NOTE — H&P (Signed)
Barbara Strickland YPP:[id number] DOB: 09/15/1960 DOA: 01/03/2020    PCP: Patient, No Pcp Per  [facility]:     Patient arrived to ER on 01/03/20 at 1234 Referred by Attending Tegeler, Gwenyth Allegra, *   Patient coming from: home Lives  With family    Chief Complaint:  Chief Complaint  Patient presents with  . Painful urination     HPI: Barbara Strickland is a 59 y.o. female with medical history significant of Htn, Kidney stones, HLD    Presented with  Painful urination  Patient has had dysuria, urgency and frequency for the last 4 days.  She is also had some right flank/right abdominal pain as well. NO FEVER, NO n/v Feels similar to prior kidney stones Reports mild head ache no fever at home Does not smoke or drink Family reports constipation  Infectious risk factors:  Reports  fever,  /abdominal pain,    Has   been vaccinated against COVID    Initial COVID TEST  NEGATIVE   Lab Results  Component Value Date   [place] NEGATIVE 01/03/2020    Regarding pertinent Chronic problems:    Hyperlipidemia -  on statins Lipitor Lipid Panel  No results found for: CHOL, TRIG, HDL, CHOLHDL, VLDL, LDLCALC, LDLDIRECT, LABVLDL   HTN on NOrvasc, Cozaar  While in ER: Noted to be febrile up to 101.1 UA showing evidence of UTI Started on Rocephin given IV fluids Patient was never hypotensive or tachycardic Noted to have right lower quadrant tenderness and right CVA tenderness CT scan shows right-sided hydronephrosis and hydroureter secondary to 4 mm UVJ calculus with a staghorn right renal stone. Her daughter is at bedside and acting as an interpreter.  ER Provider Called: Urology    Dr. 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 Blood culture (routine x 2)  . Urine culture  . Resp Panel by RT-PCR (Flu A&B, Covid) Nasopharyngeal Swab  . CT Renal Stone Study  . Urinalysis, Routine w reflex microscopic  . CBC with Differential  . Comprehensive metabolic panel  . Protime-INR  . APTT  . Lactic acid, plasma  . Magnesium  . Diet NPO time specified  . DO NOT delay antibiotics if unable to obtain blood culture.  . Vital signs  . Informed Consent Details: Physician/Practitioner Attestation; Transcribe to consent form and obtain patient signature  . Code Sepsis activation.  This occurs automatically when order is signed and prioritizes pharmacy, lab, and radiology services for STAT collections and interventions.  If [facility] downtime, call [facility] [phone]) to activate Code Sepsis.  Marland Kitchen pharmacy consult  . Consult to urology  ALL PATIENTS BEING ADMITTED/HAVING PROCEDURES NEED COVID-19 SCREENING  . Consult to hospitalist  ALL PATIENTS BEING ADMITTED/HAVING PROCEDURES NEED COVID-19 SCREENING  . gentamicin per pharmacy consult  . ED EKG  . EKG 12-Lead  . Insert 2nd peripheral IV if  not already present.    Following Medications were ordered in ER: Medications  lactated ringers infusion ( Intravenous New Bag/Given 01/03/20 1700)  gentamicin (GARAMYCIN) 280 mg in dextrose 5 % 100 mL IVPB (has no administration in time range)  acetaminophen (TYLENOL) tablet 650 mg (650 mg Oral Given 01/03/20 1449)  cefTRIAXone (ROCEPHIN) 1 g in sodium chloride 0.9 % 100 mL IVPB (0 g Intravenous Stopped 01/03/20 1517)  sodium chloride 0.9 % bolus 1,000 mL (0 mLs Intravenous  Stopped 01/03/20 1703)        Consult Orders  (From admission, onward)         Start     Ordered   01/03/20 1754  Consult to hospitalist  ALL PATIENTS BEING ADMITTED/HAVING PROCEDURES NEED COVID-19 SCREENING  Once       Comments: ALL PATIENTS BEING ADMITTED/HAVING PROCEDURES NEED COVID-19 SCREENING  Provider:  (Not yet assigned)  Question Answer Comment  Place call to: [facility]   Reason for Consult Admit      01/03/20 1753          Significant initial  Findings: Abnormal Labs Reviewed  URINALYSIS, ROUTINE W REFLEX MICROSCOPIC - Abnormal; Notable for the following components:      Result Value   APPearance CLOUDY (*)    Hgb urine dipstick MODERATE (*)    Protein, ur 30 (*)    Nitrite POSITIVE (*)    Leukocytes,Ua LARGE (*)    WBC, UA >50 (*)    Bacteria, UA MANY (*)    All other components within normal limits  CBC WITH DIFFERENTIAL/PLATELET - Abnormal; Notable for the following components:   RBC 5.58 (*)    Hemoglobin 11.6 (*)    MCV 68.1 (*)    MCH 20.8 (*)    RDW 15.8 (*)    Platelets 129 (*)    All other components within normal limits  COMPREHENSIVE METABOLIC PANEL - Abnormal; Notable for the following components:   Potassium 3.3 (*)    Glucose, Bld 108 (*)    Calcium 8.4 (*)    All other components within normal limits   Otherwise labs showing:   Recent Labs  Lab 01/03/20 1430  NA 138  K 3.3*  CO2 25  GLUCOSE 108*  BUN 20  CREATININE 0.88  CALCIUM 8.4*    Cr   stable,    Lab Results  Component Value Date   CREATININE 0.88 01/03/2020     Recent Labs  Lab 01/03/20 1430  AST 20  ALT 17  ALKPHOS 45  BILITOT 1.0  PROT 7.9  ALBUMIN 3.9   Lab Results  Component Value Date   CALCIUM 8.4 (L) 01/03/2020   WBC      Component Value Date/Time   WBC 8.7 01/03/2020 1430   LYMPHSABS 1.6 01/03/2020 1430   MONOABS 0.8 01/03/2020 1430   EOSABS 0.1 01/03/2020 1430   BASOSABS 0.0 01/03/2020 1430    Plt: Lab Results  Component Value  Date   PLT 129 (L) 01/03/2020    Lactic Acid, Venous    Component Value Date/Time   LATICACIDVEN 0.8 01/03/2020 1430    Procalcitonin    HG/HCT  stable,       Component Value Date/Time   HGB 11.6 (L) 01/03/2020 1430   HCT 38.0 01/03/2020 1430   MCV 68.1 (L) 01/03/2020 1430     ECG: Ordered Personally reviewed by me showing: HR : 64 Rhythm:  NSR    no evidence of ischemic changes QTC 515  UA   evidence of UTI     Urine analysis:    Component Value Date/Time   COLORURINE YELLOW 01/03/2020 1331   APPEARANCEUR CLOUDY (A) 01/03/2020 1331   LABSPEC 1.014 01/03/2020 1331   PHURINE 6.0 01/03/2020 1331   GLUCOSEU NEGATIVE 01/03/2020 1331   HGBUR MODERATE (A) 01/03/2020 1331   BILIRUBINUR NEGATIVE 01/03/2020 1331   KETONESUR NEGATIVE 01/03/2020 1331   PROTEINUR 30 (A) 01/03/2020 1331   NITRITE POSITIVE (A) 01/03/2020 1331   LEUKOCYTESUR LARGE (A) 01/03/2020 1331   Ordered   CTabd/pelvis -Right-sided hydronephrosis and hydroureter secondary to 4 mm right UVJ calculus.    ED Triage Vitals  Enc Vitals Group     BP 01/03/20 1244 (!) 192/124     Pulse Rate 01/03/20 1244 70     Resp 01/03/20 1244 18     Temp 01/03/20 1244 99.7 F (37.6 C)     Temp Source 01/03/20 1244 Oral     SpO2 01/03/20 1244 98 %     Weight 01/03/20 1330 155 lb (70.3 kg)     Height 01/03/20 1330 5' (1.524 m)     Head Circumference --      Peak Flow --      Pain Score 01/03/20 1242 10     Pain Loc --      Pain Edu? --      Excl. in [place]? --   TMAX(24)@       Latest  Blood pressure (!) 182/95, pulse 69, temperature 98.1 F (36.7 C), temperature source Oral, resp. rate 18, height 5' (1.524 m), weight 70.3 kg, SpO2 99 %.     Review of Systems:    Pertinent positives include: chills, fatigue, back pain  Constitutional:  No weight loss, night sweats, Fevers,  weight loss  HEENT:  No headaches, Difficulty swallowing,Tooth/dental problems,Sore throat,  No sneezing, itching, ear ache, nasal  congestion, post nasal drip,  Cardio-vascular:  No chest pain, Orthopnea, PND, anasarca, dizziness, palpitations.no Bilateral lower extremity swelling  GI:  No heartburn, indigestion, abdominal pain, nausea, vomiting, diarrhea, change in bowel habits, loss of appetite, melena, blood in stool, hematemesis Resp:  no shortness of breath at rest. No dyspnea on exertion, No excess mucus, no productive cough, No non-productive cough, No coughing up of blood.No change in color of mucus.No wheezing. Skin:  no rash or lesions. No jaundice GU:  no dysuria, change in color of urine, no urgency or frequency. No straining to urinate.  No flank pain.  Musculoskeletal:  No joint pain or no joint swelling. No decreased range of motion. No back pain.  Psych:  No change in mood or affect. No depression or anxiety. No memory loss.  Neuro: no localizing neurological complaints, no tingling, no weakness, no double vision, no gait abnormality, no slurred speech, no confusion  All systems reviewed and apart from [place] all are negative  Past Medical History:   Past Medical History:  Diagnosis Date  . Hypertension     History reviewed. No pertinent surgical history.  Social History:  Ambulatory cane     reports that she has never smoked. She has never used smokeless tobacco. She reports that she does not drink alcohol and does not use drugs.   Family History:   Family History  Problem Relation Age of Onset  . Hypertension Neg Hx   . Hyperlipidemia Neg Hx     Allergies: No Known Allergies   Prior to Admission medications   Medication Sig Start Date End  Date Taking? Authorizing Provider  amLODipine (NORVASC) 5 MG tablet Take 5 mg by mouth daily.   Yes [provider]  atorvastatin (LIPITOR) 40 MG tablet Take 40 mg by mouth daily.   Yes [provider]  losartan (COZAAR) 50 MG tablet Take 50 mg by mouth daily.   Yes [provider]   Physical Exam: Vitals with BMI  01/03/2020 01/03/2020 01/03/2020  Height - - -  Weight - - -  BMI - - -  Systolic 376 283 151  Diastolic 95 96 761  Pulse 69 65 68    1. General:  in No  Acute distress   Chronically ill  -appearing 2. Psychological: Alert and  Oriented 3. Head/ENT:   Dry Mucous Membranes                          Head Non traumatic, neck supple                            Poor Dentition 4. SKIN:  decreased Skin turgor,  Skin clean Dry and intact no rash 5. Heart: Regular rate and rhythm no  Murmur, no Rub or gallop 6. Lungs:  no wheezes or crackles   7. Abdomen: Soft,  non-tender, Non distended   Obese bowel sounds present 8. Lower extremities: no clubbing, cyanosis, no edema 9. Neurologically Grossly intact, moving all 4 extremities equally  10. MSK: Normal range of motion   All other LABS:     Recent Labs  Lab 01/03/20 1430  WBC 8.7  NEUTROABS 6.3  HGB 11.6*  HCT 38.0  MCV 68.1*  PLT 129*     Recent Labs  Lab 01/03/20 1430  NA 138  K 3.3*  CL 103  CO2 25  GLUCOSE 108*  BUN 20  CREATININE 0.88  CALCIUM 8.4*     Recent Labs  Lab 01/03/20 1430  AST 20  ALT 17  ALKPHOS 45  BILITOT 1.0  PROT 7.9  ALBUMIN 3.9       Cultures: No results found for: SDES, SPECREQUEST, CULT, REPTSTATUS   Radiological Exams on Admission: CT Renal Stone Study  Result Date: 01/03/2020 CLINICAL DATA:  Flank pain.  Fever.  Evaluate for kidney stone. EXAM: CT ABDOMEN AND PELVIS WITHOUT CONTRAST TECHNIQUE: Multidetector CT imaging of the abdomen and pelvis was performed following the standard protocol without IV contrast. COMPARISON:  None. FINDINGS: Lower chest: No acute abnormality. Hepatobiliary: No focal liver abnormality identified. Small stones noted within the dependent portion of the gallbladder measuring up to 4 mm. No signs of gallbladder wall inflammation or bile duct dilatation. Pancreas: Unremarkable. No pancreatic ductal dilatation or surrounding inflammatory changes. Spleen: Normal  in size without focal abnormality. Adrenals/Urinary Tract: There are multiple calcifications within the posterior and lateral upper pole calices of the right kidney. Central right upper pole stone measures 2.4 cm. Right-sided hydronephrosis and hydroureter noted. At the right UVJ there is a small stone measuring 4 mm, image 74/2. No left renal calculi or left-sided hydronephrosis or hydroureter. The left renal collecting system appears duplicated. Stomach/Bowel: Stomach is within normal limits. Appendix appears normal. No evidence of bowel wall thickening, distention, or inflammatory changes. Vascular/Lymphatic: Normal appearance of the abdominal aorta. No aneurysm. No abdominopelvic adenopathy identified. Reproductive: Uterus and bilateral adnexa are unremarkable. Other: No free fluid or fluid collections Musculoskeletal: No acute or significant osseous findings. IMPRESSION: 1. Right-sided hydronephrosis and hydroureter  secondary to 4 mm right UVJ calculus. 2. Multiple large upper pole right renal calculi. 3. Gallstones. Electronically Signed   By: Kerby Moors M.D.   On: 01/03/2020 15:27    Chart has been reviewed  Assessment/Plan   59 y.o. female with medical history significant of Htn, Kidney stones, HLD    Admitted for Sepsis due to UTI with obstructing renal stone  Present on Admission: . Acute lower UTI -  - treat with Rocephin , was given   A dose of gentamicin perioperatively, unless there are any specific indications would avoid re dosing to reduce renal toxicity       await results of urine culture and adjust antibiotic coverage as needed  . Essential hypertension - allo permissive HTn for tonight given SEPSIs, hydralaizne PRN  . Hydronephrosis - now sp stent placement  . Hypokalemia - will replace and check mg level  . Sepsis ([place]) -   -SIRS criteria met with    fever RR >20    -Most likely source being: urinary   - Obtain serial lactic acid and procalcitonin level.  -  Initiated IV antibiotics   - await results of blood and urine culture  - Rehydrate aggressively        Emergent Urology consult with stent palcement    8:12 PM  . Prolonged QT interval - - will monitor on tele avoid QT prolonging medications, rehydrate correct electrolytes  Constipation  Other plan as per orders.  DVT prophylaxis:  SCD   Code Status:    Code Status: Not on file FULL CODE  as per patient /family  I had personally discussed CODE STATUS with patient and family     Family Communication:   Family  at  Bedside  plan of care was discussed with  , Daughter   Disposition Plan:       To home once workup is complete and patient is stable   Following barriers for discharge:                            Electrolytes corrected                                                            Pain controlled with PO medications                               Afebrile, white count improving able to transition to PO antibiotics                             Will need to be able to tolerate PO                                                        Will need consultants to evaluate patient prior to discharge                     Would benefit from PT/OT eval prior to DC  Ordered  Consults called: urology Dr. Gilford Rile is consulting   Admission status:  ED Disposition    None      inpatient     I Expect 2 midnight stay secondary to severity of patient's current illness need for inpatient interventions justified by the following:  hemodynamic instability despite optimal treatment ( tachypnea  )   Severe lab/radiological/exam abnormalities including:    hydronephrosis   That are currently affecting medical management.   I expect  patient to be hospitalized for 2 midnights requiring inpatient medical care.  Patient is at high risk for adverse outcome (such as loss of life or disability) if not treated.  Indication for inpatient stay as  follows:    Hemodynamic instability despite maximal medical therapy,    Need for operative/procedural  intervention   Need for IV antibiotics, IV fluids,     Level of care     tele  For   24H     Lab Results  Component Value Date   [place] NEGATIVE 01/03/2020     Precautions: admitted as  Covid Negative  PPE: Used by the provider:   P100  eye Goggles,  Gloves       Teneisha Gignac 01/03/2020, 9:17 PM    [facility]     after 2 AM please page floor coverage PA If 7AM-7PM, please contact the day team taking care of the patient using [email or web address]   Patient was evaluated in the context of the global COVID-19 pandemic, which necessitated consideration that the patient might be at risk for infection with the SARS-CoV-2 virus that causes COVID-19. Institutional protocols and algorithms that pertain to the evaluation of patients at risk for COVID-19 are in a state of rapid change based on information released by regulatory bodies including the CDC and federal and state organizations. These policies and algorithms were followed during the patient's care.

## 2020-01-03 NOTE — ED Provider Notes (Signed)
[facility] DEPT Provider Note   CSN: [id number] Arrival date & time: 01/03/20  1234     History Chief Complaint  Patient presents with   Painful urination    Patient's family member is at bedside and is acting as an Astronomer.  Patient declined formal interpreter.  [name] is a 59 y.o. female.  HPI   59 year old female with a history of hypertension who presents to the emergency department today for evaluation of urinary symptoms.  Patient has had dysuria, urgency and frequency for the last 4 days.  She is also had some right flank/right abdominal pain as well.  She is had no fevers at home.  No nausea or vomiting.  She has had a history of kidney stones in the past and states this feels similar.  Past Medical History:  Diagnosis Date   Hypertension     There are no problems to display for this patient.   History reviewed. No pertinent surgical history.   OB History   No obstetric history on file.     No family history on file.  Social History   Tobacco Use   Smoking status: Never Smoker   Smokeless tobacco: Never Used  Substance Use Topics   Alcohol use: Never   Drug use: Never    Home Medications Prior to Admission medications   Medication Sig Start Date End Date Taking? Authorizing Provider  amLODipine (NORVASC) 5 MG tablet Take 5 mg by mouth daily.   Yes [provider]  atorvastatin (LIPITOR) 40 MG tablet Take 40 mg by mouth daily.   Yes [provider]  losartan (COZAAR) 50 MG tablet Take 50 mg by mouth daily.   Yes [provider]    Allergies    Patient has no known allergies.  Review of Systems   Review of Systems  Constitutional: Negative for chills and fever.  HENT: Negative for ear pain and sore throat.   Eyes: Negative for visual disturbance.  Respiratory: Negative for cough and shortness of breath.   Cardiovascular: Negative for chest pain.   Gastrointestinal: Negative for abdominal pain, constipation, diarrhea, nausea and vomiting.  Genitourinary: Positive for dysuria, frequency and urgency. Negative for hematuria.  Musculoskeletal: Negative for back pain.  Skin: Negative for rash.  Neurological: Negative for headaches.  All other systems reviewed and are negative.   Physical Exam Updated Vital Signs BP (!) 180/98    Pulse 80    Temp (!) 101.1 F (38.4 C) (Oral)    Resp (!) 29    Ht 5' (1.524 m)    Wt 70.3 kg    SpO2 98%    BMI 30.27 kg/m   Physical Exam Vitals and nursing note reviewed.  Constitutional:      General: She is not in acute distress.    Appearance: She is well-developed.  HENT:     Head: Normocephalic and atraumatic.  Eyes:     Conjunctiva/sclera: Conjunctivae normal.  Cardiovascular:     Rate and Rhythm: Normal rate and regular rhythm.     Heart sounds: Normal heart sounds. No murmur heard.   Pulmonary:     Effort: Pulmonary effort is normal. No respiratory distress.     Breath sounds: Normal breath sounds. No wheezing, rhonchi or rales.  Abdominal:     General: Bowel sounds are normal.     Palpations: Abdomen is soft.     Tenderness: There is abdominal tenderness (mild rlq ttp). There is  right CVA tenderness. There is no guarding or rebound.  Musculoskeletal:     Cervical back: Neck supple.  Skin:    General: Skin is warm and dry.  Neurological:     Mental Status: She is alert.     ED Results / Procedures / Treatments   Labs (all labs ordered are listed, but only abnormal results are displayed) Labs Reviewed  URINALYSIS, ROUTINE W REFLEX MICROSCOPIC - Abnormal; Notable for the following components:      Result Value   APPearance CLOUDY (*)    Hgb urine dipstick MODERATE (*)    Protein, ur 30 (*)    Nitrite POSITIVE (*)    Leukocytes,Ua LARGE (*)    WBC, UA >50 (*)    Bacteria, UA MANY (*)    All other components within normal limits  CBC WITH DIFFERENTIAL/PLATELET - Abnormal;  Notable for the following components:   RBC 5.58 (*)    Hemoglobin 11.6 (*)    MCV 68.1 (*)    MCH 20.8 (*)    RDW 15.8 (*)    Platelets 129 (*)    All other components within normal limits  CULTURE, BLOOD (ROUTINE X 2)  CULTURE, BLOOD (ROUTINE X 2)  URINE CULTURE  RESP PANEL BY RT-PCR (FLU A&B, COVID) ARPGX2  PROTIME-INR  APTT  COMPREHENSIVE METABOLIC PANEL  LACTIC ACID, PLASMA  LACTIC ACID, PLASMA    EKG None  Radiology No results found.  Procedures Procedures (including critical care time)  Medications Ordered in ED Medications  lactated ringers infusion (has no administration in time range)  acetaminophen (TYLENOL) tablet 650 mg (650 mg Oral Given 01/03/20 1449)  cefTRIAXone (ROCEPHIN) 1 g in sodium chloride 0.9 % 100 mL IVPB (1 g Intravenous New Bag/Given 01/03/20 1447)  sodium chloride 0.9 % bolus 1,000 mL (1,000 mLs Intravenous New Bag/Given 01/03/20 1448)    ED Course  I have reviewed the triage vital signs and the nursing notes.  Pertinent labs & imaging results that were available during my care of the patient were reviewed by me and considered in my medical decision making (see chart for details).    MDM Rules/Calculators/A&P                          58 year old female presenting the emergency department today for evaluation of urinary symptoms and flank pain.  On arrival she is febrile.  She is not tachycardic or hypotensive.  Reviewed/interpreted labs Blood cultures obtained UA with nitrites, leukocytes, RBCs, WBCs and bacteria noted consistent with UTI.  Dose of ceftriaxone given in the ED and urine culture obtained.  At shift change, laboratory work and CT scan are pending.  Care transition to Wyn Quaker, PA-C with plan to follow-up on imaging and laboratory work and likely admit patient for pyelonephritis versus concern for infected stone.   Final Clinical Impression(s) / ED Diagnoses Final diagnoses:  Pyelonephritis    Rx / DC  Orders ED Discharge Orders    None       Rodney Booze, PA-C 01/03/20 1517    Tegeler, Gwenyth Allegra, MD 01/03/20 1622

## 2020-01-03 NOTE — Progress Notes (Signed)
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

## 2020-01-03 NOTE — ED Triage Notes (Signed)
Pt presents with c/o painful urination. Pt reports pain is in her lower abdomen and below.

## 2020-01-03 NOTE — Progress Notes (Signed)
[facility]Link is monitoring this sepsis. Thank you 

## 2020-01-03 NOTE — Progress Notes (Signed)
Patient received from PACU.   01/03/20 2045  Vitals  Temp 98.6 F (37 C)  Temp Source Oral  BP (!) 162/89  MAP (mmHg) 111  BP Location Right Arm  BP Method Automatic  Patient Position (if appropriate) Lying  Pulse Rate 65  Resp 17  MEWS COLOR  MEWS Score Color Green  Oxygen Therapy  SpO2 97 %  O2 Device Room Air

## 2020-01-04 ENCOUNTER — Encounter (HOSPITAL_COMMUNITY): Payer: Self-pay | Admitting: Urology

## 2020-01-04 DIAGNOSIS — N3001 Acute cystitis with hematuria: Secondary | ICD-10-CM

## 2020-01-04 LAB — CBC WITH DIFFERENTIAL/PLATELET
Abs Immature Granulocytes: 0.02 10*3/uL (ref 0.00–0.07)
Basophils Absolute: 0 10*3/uL (ref 0.0–0.1)
Basophils Relative: 0 %
Eosinophils Absolute: 0 10*3/uL (ref 0.0–0.5)
Eosinophils Relative: 0 %
HCT: 35.4 % — ABNORMAL LOW (ref 36.0–46.0)
Hemoglobin: 10.8 g/dL — ABNORMAL LOW (ref 12.0–15.0)
Immature Granulocytes: 0 %
Lymphocytes Relative: 9 %
Lymphs Abs: 0.6 10*3/uL — ABNORMAL LOW (ref 0.7–4.0)
MCH: 20.5 pg — ABNORMAL LOW (ref 26.0–34.0)
MCHC: 30.5 g/dL (ref 30.0–36.0)
MCV: 67.3 fL — ABNORMAL LOW (ref 80.0–100.0)
Monocytes Absolute: 0.1 10*3/uL (ref 0.1–1.0)
Monocytes Relative: 1 %
Neutro Abs: 5.9 10*3/uL (ref 1.7–7.7)
Neutrophils Relative %: 90 %
Platelets: 114 10*3/uL — ABNORMAL LOW (ref 150–400)
RBC: 5.26 MIL/uL — ABNORMAL HIGH (ref 3.87–5.11)
RDW: 15.9 % — ABNORMAL HIGH (ref 11.5–15.5)
WBC: 6.6 10*3/uL (ref 4.0–10.5)
nRBC: 0 % (ref 0.0–0.2)

## 2020-01-04 LAB — COMPREHENSIVE METABOLIC PANEL
ALT: 16 U/L (ref 0–44)
AST: 18 U/L (ref 15–41)
Albumin: 3.4 g/dL — ABNORMAL LOW (ref 3.5–5.0)
Alkaline Phosphatase: 37 U/L — ABNORMAL LOW (ref 38–126)
Anion gap: 11 (ref 5–15)
BUN: 17 mg/dL (ref 8–23)
CO2: 19 mmol/L — ABNORMAL LOW (ref 22–32)
Calcium: 8.2 mg/dL — ABNORMAL LOW (ref 8.9–10.3)
Chloride: 109 mmol/L (ref 98–111)
Creatinine, Ser: 0.83 mg/dL (ref 0.44–1.00)
GFR, Estimated: >60 mL/min (ref >60)
Glucose, Bld: 154 mg/dL — ABNORMAL HIGH (ref 70–99)
Potassium: 4.4 mmol/L (ref 3.5–5.1)
Sodium: 139 mmol/L (ref 135–145)
Total Bilirubin: 0.8 mg/dL (ref 0.3–1.2)
Total Protein: 7 g/dL (ref 6.5–8.1)

## 2020-01-04 LAB — TSH: TSH: 0.387 u[IU]/mL (ref 0.350–4.500)

## 2020-01-04 LAB — MAGNESIUM: Magnesium: 2 mg/dL (ref 1.7–2.4)

## 2020-01-04 LAB — PHOSPHORUS: Phosphorus: 4.1 mg/dL (ref 2.5–4.6)

## 2020-01-04 LAB — HIV ANTIBODY (ROUTINE TESTING W REFLEX): HIV Screen 4th Generation wRfx: NONREACTIVE

## 2020-01-04 MED ORDER — INSULIN ASPART 100 UNIT/ML ~~LOC~~ SOLN: 0.0000 [IU] | Freq: Three times a day (TID) | SUBCUTANEOUS | Status: DC

## 2020-01-04 MED ORDER — CHLORHEXIDINE GLUCONATE CLOTH 2 % EX PADS: 6.0000 | MEDICATED_PAD | Freq: Every day | CUTANEOUS | Status: DC

## 2020-01-04 MED ORDER — ENOXAPARIN SODIUM 40 MG/0.4ML ~~LOC~~ SOLN
40.0000 mg | SUBCUTANEOUS | Status: DC
  Administered 2020-01-04: 40 mg via SUBCUTANEOUS
  Filled 2020-01-04: qty 0.4

## 2020-01-04 NOTE — Progress Notes (Signed)
PROGRESS NOTE    Barbara Strickland  WER:[id number] DOB: 09-Aug-1960 DOA: 01/03/2020 PCP: Patient, No Pcp Per   Chief Complaint  Patient presents with  . Painful urination    Brief Narrative:  59 yo Mont[place] speaking F with hx of HTN, kidney stones, HLD who presented with sepsis 2/2 UTI with right sided hydronephrosis and hydroureter.  [name] now s/p stent placement with urology.  Assessment & Plan:   Active Problems:   Kidney stone on right side   Acute lower UTI   Essential hypertension   Hydronephrosis   Hypokalemia   Sepsis (HCC)   Prolonged QT interval  Sepsis 2/2 UTI with obstructing 4 mm stone Right Sided hydronephrosis and hydroureter E. Coli UTI  Sepsis ruled in, fever, tachypnea, UTI CT with R sided hydro 2/2 4 mm R UVJ calculus Urine cx with >100,000 e. Coli  S/p cystoscopy with R JJ stent placement, R retrograde pyelogram with intraoperative interpretation of fluoroscopic imaging Gentamicin/ceftriaxone on admission.  Deescalate to ceftriaxone alone today.  Follow final cx results. Urology c/s, appreciate recs - needs definitive treatment of distal ureteral calculus in next 2-3 weeks  Hypertension Hold home amlodipien and losartan for now  HLD Continue lipitor  Thrombocytopenia Mild, follow ctm  Hyperglycemia SSI, follow A1c  DVT prophylaxis: lovenox Code Status: full  Family Communication: daughter at bedside Disposition:   Status is: Inpatient  Remains inpatient appropriate because:Inpatient level of care appropriate due to severity of illness   Dispo: The patient is from: Home              Anticipated d/c is to: Home              Anticipated d/c date is: 1 day              Patient currently is not medically stable to d/c.   Consultants:   urology  Procedures:  S/p cystoscopy with R JJ stent placement, R retrograde pyelogram with intraoperative interpretation of fluoroscopic imaging  Antimicrobials:  Anti-infectives (From admission, onward)    Start     Dose/Rate Route Frequency Ordered Stop   01/04/20 1400  cefTRIAXone (ROCEPHIN) 1 g in sodium chloride 0.9 % 100 mL IVPB  Status:  Discontinued        1 g 200 mL/hr over 30 Minutes Intravenous Every 24 hours 01/03/20 2125 01/03/20 2128   01/04/20 1400  cefTRIAXone (ROCEPHIN) 1 g in sodium chloride 0.9 % 100 mL IVPB        1 g 200 mL/hr over 30 Minutes Intravenous Every 24 hours 01/03/20 2106     01/03/20 1930  gentamicin (GARAMYCIN) 280 mg in dextrose 5 % 100 mL IVPB  Status:  Discontinued        5 mg/kg  55.4 kg (Adjusted) 107 mL/hr over 60 Minutes Intravenous Every 24 hours 01/03/20 1830 01/04/20 1011   01/03/20 1430  cefTRIAXone (ROCEPHIN) 1 g in sodium chloride 0.9 % 100 mL IVPB        1 g 200 mL/hr over 30 Minutes Intravenous  Once 01/03/20 1427 01/03/20 1517      Subjective: No new complaints feeling better Daughter interpreted today  Objective: Vitals:   01/04/20 0254 01/04/20 0626 01/04/20 0824 01/04/20 1348  BP: 114/70 (!) 151/86 133/71 (!) 164/98  Pulse: (!) 55 91 61 66  Resp: 17 18 16 16   Temp: 97.6 F (36.4 C) (!) 97.3 F (36.3 C)  98.3 F (36.8 C)  TempSrc: Oral Oral Oral Oral  SpO2:  96% 96% 95% 97%  Weight:      Height:        Intake/Output Summary (Last 24 hours) at 01/04/2020 1644 Last data filed at 01/04/2020 0707 Gross per 24 hour  Intake 2335.85 ml  Output 525 ml  Net 1810.85 ml   Filed Weights   01/03/20 1330  Weight: 70.3 kg    Examination:  General exam: Appears calm and comfortable  Respiratory system: Clear to auscultation. Respiratory effort normal. Cardiovascular system: S1 & S2 heard, RRR.  Gastrointestinal system: Abdomen is nondistended, soft and nontender.  Central nervous system: Alert and oriented. No focal neurological deficits. Extremities: no LEE Skin: No rashes, lesions or ulcers Psychiatry: Judgement and insight appear normal. Mood & affect appropriate.     Data Reviewed: I have personally reviewed following  labs and imaging studies  CBC: Recent Labs  Lab 01/03/20 1430 01/04/20 0420  WBC 8.7 6.6  NEUTROABS 6.3 5.9  HGB 11.6* 10.8*  HCT 38.0 35.4*  MCV 68.1* 67.3*  PLT 129* 114*    Basic Metabolic Panel: Recent Labs  Lab 01/03/20 1430 01/03/20 2001 01/04/20 0420  NA 138 140 139  K 3.3* 3.2* 4.4  CL 103 109 109  CO2 25 20* 19*  GLUCOSE 108* 121* 154*  BUN 20 15 17   CREATININE 0.88 0.83 0.83  CALCIUM 8.4* 7.9* 8.2*  MG  --  2.0 2.0  PHOS  --   --  4.1    GFR: Estimated Creatinine Clearance: 62.3 mL/min (by C-G formula based on SCr of 0.83 mg/dL).  Liver Function Tests: Recent Labs  Lab 01/03/20 1430 01/04/20 0420  AST 20 18  ALT 17 16  ALKPHOS 45 37*  BILITOT 1.0 0.8  PROT 7.9 7.0  ALBUMIN 3.9 3.4*    CBG: No results for input(s): GLUCAP in the last 168 hours.   Recent Results (from the past 240 hour(s))  Urine culture     Status: Abnormal (Preliminary result)   Collection Time: 01/03/20  1:31 PM   Specimen: Urine, Random  Result Value Ref Range Status   Specimen Description   Final    URINE, RANDOM Performed at [facility] [address] [place], [place] [address]    Special Requests   Final    NONE Performed at [facility], [place] [address] [place], [place] [address]    Culture ([place])  Final    >=100,000 COLONIES/mL ESCHERICHIA COLI SUSCEPTIBILITIES TO FOLLOW Performed at [facility], [place] [address], [place], [place] [address]    Report Status PENDING  Incomplete  Blood culture (routine x 2)     Status: None (Preliminary result)   Collection Time: 01/03/20  2:30 PM   Specimen: BLOOD  Result Value Ref Range Status   Specimen Description   Final    BLOOD RIGHT ANTECUBITAL Performed at [facility] [address] [place], [place] [address]    Special Requests   Final    BOTTLES DRAWN AEROBIC AND ANAEROBIC Blood Culture adequate volume Performed at [facility] [address] [place], [place] [address]    Culture   Final    NO GROWTH < 24 HOURS Performed at [facility] [address] [place],  [address]    Report Status PENDING  Incomplete  Blood culture (routine x 2)     Status: None (Preliminary result)   Collection Time: 01/03/20  2:30 PM   Specimen: BLOOD  Result  Value Ref Range Status   Specimen Description   Final    BLOOD RIGHT WRIST Performed at [facility] [address], [place], [place] [address]    Special Requests   Final    BOTTLES DRAWN AEROBIC ONLY Blood Culture results may not be optimal due to an inadequate volume of blood received in culture bottles Performed at [facility] [address], [place], [place] [address]    Culture   Final    NO GROWTH < 24 HOURS Performed at [facility] [address] [place], [place] [address]    Report Status PENDING  Incomplete  Resp Panel by RT-PCR (Flu Maksymilian Mabey&B, Covid) Nasopharyngeal Swab     Status: None   Collection Time: 01/03/20  3:28 PM   Specimen: Nasopharyngeal Swab; Nasopharyngeal(NP) swabs in vial transport medium  Result Value Ref Range Status   SARS Coronavirus 2 by RT PCR NEGATIVE NEGATIVE Final    Comment: (NOTE) SARS-CoV-2 target nucleic acids are NOT DETECTED.  The SARS-CoV-2 RNA is generally detectable in upper respiratory specimens during the acute phase of infection. The lowest concentration of SARS-CoV-2 viral copies this assay can detect is 138 copies/mL. Tiny Rietz negative result does not preclude SARS-Cov-2 infection and should not be used as the sole basis for treatment or other patient management decisions. Yani Lal negative result may occur with  improper specimen collection/handling, submission of specimen other than nasopharyngeal swab, presence of viral mutation(s) within the areas targeted by this assay, and inadequate number of viral copies(<138 copies/mL). Chaniece Barbato negative result must be combined  with clinical observations, patient history, and epidemiological information. The expected result is Negative.  Fact Sheet for Patients:  EntrepreneurPulse.com.au  Fact Sheet for Healthcare Providers:  IncredibleEmployment.be  This test is no t yet approved or cleared by the [place] FDA and  has been authorized for detection and/or diagnosis of SARS-CoV-2 by FDA under an Emergency Use Authorization (EUA). This EUA will remain  in effect (meaning this test can be used) for the duration of the COVID-19 declaration under Section 564(b)(1) of the Act, 21 U.S.C.section 360bbb-3(b)(1), unless the authorization is terminated  or revoked sooner.       Influenza Masa Lubin by PCR NEGATIVE NEGATIVE Final   Influenza B by PCR NEGATIVE NEGATIVE Final    Comment: (NOTE) The Xpert Xpress SARS-CoV-2/FLU/RSV plus assay is intended as an aid in the diagnosis of influenza from Nasopharyngeal swab specimens and should not be used as Anel Creighton sole basis for treatment. Nasal washings and aspirates are unacceptable for Xpert Xpress SARS-CoV-2/FLU/RSV testing.  Fact Sheet for Patients: EntrepreneurPulse.com.au  Fact Sheet for Healthcare Providers: IncredibleEmployment.be  This test is not yet approved or cleared by the [place] FDA and has been authorized for detection and/or diagnosis of SARS-CoV-2 by FDA under an Emergency Use Authorization (EUA). This EUA will remain in effect (meaning this test can be used) for the duration of the COVID-19 declaration under Section 564(b)(1) of the Act, 21 U.S.C. section 360bbb-3(b)(1), unless the authorization is terminated or revoked.  Performed at [facility], [place] [address] [place], [place] [address]          Radiology Studies: DG C-Arm 1-60 Min-No Report  Result Date: 01/03/2020 Fluoroscopy was utilized by the requesting physician.  No radiographic  interpretation.   CT Renal Stone Study  Result Date: 01/03/2020 CLINICAL DATA:  Flank pain.  Fever.  Evaluate for kidney stone. EXAM: CT ABDOMEN AND PELVIS WITHOUT CONTRAST TECHNIQUE: Multidetector  CT imaging of the abdomen and pelvis was performed following the standard protocol without IV contrast. COMPARISON:  None. FINDINGS: Lower chest: No acute abnormality. Hepatobiliary: No focal liver abnormality identified. Small stones noted within the dependent portion of the gallbladder measuring up to 4 mm. No signs of gallbladder wall inflammation or bile duct dilatation. Pancreas: Unremarkable. No pancreatic ductal dilatation or surrounding inflammatory changes. Spleen: Normal in size without focal abnormality. Adrenals/Urinary Tract: There are multiple calcifications within the posterior and lateral upper pole calices of the right kidney. Central right upper pole stone measures 2.4 cm. Right-sided hydronephrosis and hydroureter noted. At the right UVJ there is Cleon Thoma small stone measuring 4 mm, image 74/2. No left renal calculi or left-sided hydronephrosis or hydroureter. The left renal collecting system appears duplicated. Stomach/Bowel: Stomach is within normal limits. Appendix appears normal. No evidence of bowel wall thickening, distention, or inflammatory changes. Vascular/Lymphatic: Normal appearance of the abdominal aorta. No aneurysm. No abdominopelvic adenopathy identified. Reproductive: Uterus and bilateral adnexa are unremarkable. Other: No free fluid or fluid collections Musculoskeletal: No acute or significant osseous findings. IMPRESSION: 1. Right-sided hydronephrosis and hydroureter secondary to 4 mm right UVJ calculus. 2. Multiple large upper pole right renal calculi. 3. Gallstones. Electronically Signed   By: Kerby Moors M.D.   On: 01/03/2020 15:27        Scheduled Meds: . atorvastatin  40 mg Oral Daily  . Chlorhexidine Gluconate Cloth  6 each Topical Daily  . senna  1 tablet Oral BID    Continuous Infusions: . sodium chloride 100 mL/hr at 01/04/20 0709  . cefTRIAXone (ROCEPHIN)  IV 1 g (01/04/20 1511)     LOS: 1 day    Time spent: over 30 min    Fayrene Helper, MD [facility]   To contact the attending provider between 7A-7P or the covering provider during after hours 7P-7A, please log into the web site [email or web address] and access using universal [place] password for that web site. If you do not have the password, please call the hospital operator.  01/04/2020, 4:44 PM

## 2020-01-04 NOTE — Evaluation (Signed)
Physical Therapy Evaluation Patient Details Name: Barbara Strickland MRN: [id number] DOB: 07-08-1960 Today's Date: 01/04/2020   History of Present Illness  Pt is a 59 yo female admitted wtih R flank pain, fever and difficulty urinating. Pt found to have R side kidney stone and underwent a cystoscopy with R JJ stent placement. Pt has had a h/o kidney stones but surgery never needed.  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   Clinical Impression  On eval, pt was Supv-Min guard assist for mobility. She walked ~75 feet with use of her cane. Intermittent mild unsteadiness but no LOB during session. Pt tolerated activity well. Son was present during session and he assisted with translating. Pt is like very close to her baseline with respect to mobility. Will plan to follow during hospital stay but I don't anticipate any f/u PT needs after discharge.     Follow Up Recommendations No PT follow up;Supervision for mobility/OOB    Equipment Recommendations  None recommended by PT    Recommendations for Other Services       Precautions / Restrictions Precautions Precautions: Fall Precaution Comments: Pt had family that supervises her and rarely falls per daughter. Restrictions Weight Bearing Restrictions: No      Mobility  Bed Mobility               General bed mobility comments: oob in recliner    Transfers Overall transfer level: Needs assistance Equipment used: None;Straight cane Transfers: Sit to/from Stand Sit to Stand: Supervision            Ambulation/Gait Ambulation/Gait assistance: Min guard Gait Distance (Feet): 75 Feet Assistive device: Straight cane Gait Pattern/deviations: Step-to pattern;Decreased stride length;Decreased step length - left     General Gait Details: Min guard for safety. Increased lateral sway to R side but no LOB. Pt tolerated distance well.  Stairs            Wheelchair Mobility    Modified Rankin (Stroke Patients Only)        Balance Overall balance assessment: Needs assistance         Standing balance support: Single extremity supported Standing balance-Leahy Scale: Fair Standing balance comment: Pt requires outside support of cane to take any challenges.                             Pertinent Vitals/Pain Pain Assessment: Faces Faces Pain Scale: No hurt    Home Living Family/patient expects to be discharged to:: Private residence Living Arrangements: Spouse/significant other;Children Available Help at Discharge: Family;Available 24 hours/day Type of Home: House Home Access: Stairs to enter Entrance Stairs-Rails: Right;Left;Can reach both Entrance Stairs-Number of Steps: 3 Home Layout: One level Home Equipment: Cane - single point      Prior Function Level of Independence: Needs assistance   Gait / Transfers Assistance Needed: walks with cane and usually has someone with her unless very short distance inside.  ADL's / Homemaking Assistance Needed: family assists with all meals and home making as well as driving.  Comments: Pt needs assist bathing self but can dress and toilet without assist.     Hand Dominance   Dominant Hand: Right    Extremity/Trunk Assessment   Upper Extremity Assessment Upper Extremity Assessment: Defer to OT evaluation    Lower Extremity Assessment Lower Extremity Assessment:  (chronic L hemiplegia 2* CVA)    Cervical / Trunk Assessment Cervical / Trunk Assessment: Normal  Communication  Communication: Prefers language other than English  Cognition Arousal/Alertness: Awake/alert Behavior During Therapy: WFL for tasks assessed/performed Overall Cognitive Status: Difficult to assess                                 General Comments: translated through son      General Comments      Exercises     Assessment/Plan    PT Assessment Patient needs continued PT services  PT Problem List Decreased mobility       PT Treatment  Interventions Gait training;DME instruction;Therapeutic activities;Therapeutic exercise;Patient/family education;Balance training;Functional mobility training    PT Goals (Current goals can be found in the Care Plan section)  Acute Rehab PT Goals Patient Stated Goal: to go home PT Goal Formulation: With patient/family Time For Goal Achievement: 01/18/20 Potential to Achieve Goals: Good    Frequency Min 3X/week   Barriers to discharge        Co-evaluation               AM-PAC PT "6 Clicks" Mobility  Outcome Measure Help needed turning from your back to your side while in a flat bed without using bedrails?: None Help needed moving from lying on your back to sitting on the side of a flat bed without using bedrails?: None Help needed moving to and from a bed to a chair (including a wheelchair)?: None Help needed standing up from a chair using your arms (e.g., wheelchair or bedside chair)?: None Help needed to walk in hospital room?: A Little Help needed climbing 3-5 steps with a railing? : A Little 6 Click Score: 22    End of Session   Activity Tolerance: Patient tolerated treatment well Patient left: in chair;with call bell/phone within reach;with family/visitor present   PT Visit Diagnosis: Other abnormalities of gait and mobility (R26.89)    Time: 8032-1224 PT Time Calculation (min) (ACUTE ONLY): 11 min   Charges:   PT Evaluation $PT Eval Low Complexity: 1 Low             Doreatha Massed, PT Acute Rehabilitation  Office: [phone] Pager: [phone]

## 2020-01-04 NOTE — Evaluation (Signed)
Occupational Therapy Evaluation and Discharge Summary Patient Details Name: Barbara Strickland MRN: [id number] DOB: Jan 24, 1961 Today's Date: 01/04/2020    History of Present Illness Pt is a 59 yo female admitted wtih R flank pain, fever and difficulty urinating. Pt found to have R side kidney stone and underwent a cystoscopy with R JJ stent placement. Pt has had a h/o kidney stones but surgery never needed.  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    Clinical Impression   Pt admitted with the above diagnosis and overall appears to be at or very close to her baseline with adls and functional mobility. Pt has had previous stroke about 13 years ago and requires assist from her family .  Pt continues to require the same amount of assist, is able to mobilize with walker and close supervision.  Pt's daughter states they are there with pt all the time and can provide supervision.  Feel pt is at or close to baseline, has the support she needs at home and can be d/c'd from OT at this time.      Follow Up Recommendations  Supervision/Assistance - 24 hour;No OT follow up    Equipment Recommendations  None recommended by OT    Recommendations for Other Services       Precautions / Restrictions Precautions Precautions: Fall Precaution Comments: Pt had family that supervises her and rarely falls per daughter. Restrictions Weight Bearing Restrictions: No      Mobility Bed Mobility Overal bed mobility: Needs Assistance Bed Mobility: Supine to Sit     Supine to sit: Min assist     General bed mobility comments: Pt accustomed to reaching for caregiver arm to pull to side of bed and does so with min assist.    Transfers Overall transfer level: Needs assistance Equipment used: Straight cane;1 person hand held assist Transfers: Sit to/from Stand;Stand Pivot Transfers Sit to Stand: Min guard Stand pivot transfers: Min assist       General transfer comment: Pt, at baseline, will  occasionally need assist with transfers. Pt not comfortable with lines today and with catheter so min assist to supervision given. Feel pt is very close to baseline with mobiltiy.    Balance Overall balance assessment: Needs assistance Sitting-balance support: Feet supported Sitting balance-Leahy Scale: Good     Standing balance support: Single extremity supported;During functional activity Standing balance-Leahy Scale: Fair Standing balance comment: Pt requires outside support of cane to take any challenges.                           ADL either performed or assessed with clinical judgement   ADL Overall ADL's : At baseline Eating/Feeding: Set up;Sitting   Grooming: Set up;Standing   Upper Body Bathing: Minimal assistance;Sitting   Lower Body Bathing: Minimal assistance;Sit to/from stand   Upper Body Dressing : Set up;Sitting   Lower Body Dressing: Set up;Sit to/from stand   Toilet Transfer: Min guard;Ambulation;Regular Toilet           Functional mobility during ADLs: Minimal assistance;Cane General ADL Comments: Pt at baseline for all adls. Pt complained of catheter many times. Nursing states it is to come out today.     Vision Baseline Vision/History: No visual deficits Patient Visual Report: No change from baseline Vision Assessment?: No apparent visual deficits     Perception Perception Perception Tested?: No   Praxis Praxis Praxis tested?: Within functional limits    Pertinent Vitals/Pain Pain  Assessment: No/denies pain     Hand Dominance Right   Extremity/Trunk Assessment Upper Extremity Assessment Upper Extremity Assessment: LUE deficits/detail LUE Deficits / Details: PROM WFL.  Pt with high tone in LUE.  Limb fairly nonfunctional and has been since 2008. LUE Sensation: decreased light touch LUE Coordination: decreased fine motor;decreased gross motor   Lower Extremity Assessment Lower Extremity Assessment: Defer to PT evaluation    Cervical / Trunk Assessment Cervical / Trunk Assessment: Normal   Communication Communication Communication: Prefers language other than English   Cognition Arousal/Alertness: Awake/alert Behavior During Therapy: WFL for tasks assessed/performed Overall Cognitive Status: Within Functional Limits for tasks assessed                                 General Comments: translated through daughter.   General Comments  Pt appears to be at or close to baseline with all basic adls and adl mobility.    Exercises     Shoulder Instructions      Home Living Family/patient expects to be discharged to:: Private residence Living Arrangements: Spouse/significant other;Children Available Help at Discharge: Family;Available 24 hours/day Type of Home: House Home Access: Stairs to enter [employer] of Steps: 3 Entrance Stairs-Rails: Right;Left;Can reach both Home Layout: One level     Bathroom Shower/Tub: Walk-in shower;Door   [employer] Toilet: Standard     Home Equipment: Cane - single point          Prior Functioning/Environment Level of Independence: Needs assistance  Gait / Transfers Assistance Needed: walks with cane and usually has someone with her unless very short distance inside. ADL's / Homemaking Assistance Needed: family assists with all meals and home making as well as driving.   Comments: Pt needs assist bathing self but can dress and toilet without assist.        OT Problem List:        OT Treatment/Interventions:      OT Goals(Current goals can be found in the care plan section) Acute Rehab OT Goals Patient Stated Goal: to go home OT Goal Formulation: All assessment and education complete, DC therapy  OT Frequency:     Barriers to D/C:            Co-evaluation              AM-PAC OT "6 Clicks" Daily Activity     Outcome Measure Help from another person eating meals?: A Little Help from another person taking care of  personal grooming?: None Help from another person toileting, which includes using toliet, bedpan, or urinal?: A Little Help from another person bathing (including washing, rinsing, drying)?: A Little Help from another person to put on and taking off regular upper body clothing?: None Help from another person to put on and taking off regular lower body clothing?: None 6 Click Score: 21   End of Session Nurse Communication: Mobility status  Activity Tolerance: Patient tolerated treatment well Patient left: in chair;with call bell/phone within reach;with family/visitor present  OT Visit Diagnosis: Unsteadiness on feet (R26.81)                Time: 1275-1700 OT Time Calculation (min): 28 min Charges:  OT General Charges $OT Visit: 1 Visit OT Evaluation $OT Eval Moderate Complexity: 1 Mod OT Treatments $Self Care/Home Management : 8-22 mins  Glenford Peers 01/04/2020, 9:44 AM

## 2020-01-05 DIAGNOSIS — N3001 Acute cystitis with hematuria: Secondary | ICD-10-CM | POA: Diagnosis not present

## 2020-01-05 DIAGNOSIS — N201 Calculus of ureter: Secondary | ICD-10-CM

## 2020-01-05 LAB — CBC WITH DIFFERENTIAL/PLATELET
Abs Immature Granulocytes: 0.02 10*3/uL (ref 0.00–0.07)
Basophils Absolute: 0 10*3/uL (ref 0.0–0.1)
Basophils Relative: 0 %
Eosinophils Absolute: 0 10*3/uL (ref 0.0–0.5)
Eosinophils Relative: 1 %
HCT: 34.1 % — ABNORMAL LOW (ref 36.0–46.0)
Hemoglobin: 10.3 g/dL — ABNORMAL LOW (ref 12.0–15.0)
Immature Granulocytes: 0 %
Lymphocytes Relative: 26 %
Lymphs Abs: 2.2 10*3/uL (ref 0.7–4.0)
MCH: 20.6 pg — ABNORMAL LOW (ref 26.0–34.0)
MCHC: 30.2 g/dL (ref 30.0–36.0)
MCV: 68.3 fL — ABNORMAL LOW (ref 80.0–100.0)
Monocytes Absolute: 0.6 10*3/uL (ref 0.1–1.0)
Monocytes Relative: 7 %
Neutro Abs: 5.6 10*3/uL (ref 1.7–7.7)
Neutrophils Relative %: 66 %
Platelets: 125 10*3/uL — ABNORMAL LOW (ref 150–400)
RBC: 4.99 MIL/uL (ref 3.87–5.11)
RDW: 16.1 % — ABNORMAL HIGH (ref 11.5–15.5)
WBC: 8.5 10*3/uL (ref 4.0–10.5)
nRBC: 0 % (ref 0.0–0.2)

## 2020-01-05 LAB — COMPREHENSIVE METABOLIC PANEL
ALT: 15 U/L (ref 0–44)
AST: 19 U/L (ref 15–41)
Albumin: 3.2 g/dL — ABNORMAL LOW (ref 3.5–5.0)
Alkaline Phosphatase: 32 U/L — ABNORMAL LOW (ref 38–126)
Anion gap: 8 (ref 5–15)
BUN: 22 mg/dL (ref 8–23)
CO2: 20 mmol/L — ABNORMAL LOW (ref 22–32)
Calcium: 8.5 mg/dL — ABNORMAL LOW (ref 8.9–10.3)
Chloride: 114 mmol/L — ABNORMAL HIGH (ref 98–111)
Creatinine, Ser: 0.88 mg/dL (ref 0.44–1.00)
GFR, Estimated: >60 mL/min (ref >60)
Glucose, Bld: 97 mg/dL (ref 70–99)
Potassium: 4 mmol/L (ref 3.5–5.1)
Sodium: 142 mmol/L (ref 135–145)
Total Bilirubin: 0.5 mg/dL (ref 0.3–1.2)
Total Protein: 6.3 g/dL — ABNORMAL LOW (ref 6.5–8.1)

## 2020-01-05 LAB — URINE CULTURE: Culture: >=100000 — AB

## 2020-01-05 LAB — GLUCOSE, CAPILLARY
Glucose-Capillary: 88 mg/dL (ref 70–99)
Glucose-Capillary: 94 mg/dL (ref 70–99)

## 2020-01-05 LAB — PHOSPHORUS: Phosphorus: 3.5 mg/dL (ref 2.5–4.6)

## 2020-01-05 LAB — MAGNESIUM: Magnesium: 2 mg/dL (ref 1.7–2.4)

## 2020-01-05 MED ORDER — SULFAMETHOXAZOLE-TRIMETHOPRIM 800-160 MG PO TABS: 1.0000 | ORAL_TABLET | Freq: Two times a day (BID) | ORAL | 0 refills | Status: AC

## 2020-01-05 MED ORDER — AMLODIPINE BESYLATE 5 MG PO TABS
5.0000 mg | ORAL_TABLET | Freq: Every day | ORAL | Status: DC
  Administered 2020-01-05: 5 mg via ORAL
  Filled 2020-01-05: qty 1

## 2020-01-05 MED ORDER — LOSARTAN POTASSIUM 50 MG PO TABS
50.0000 mg | ORAL_TABLET | Freq: Every day | ORAL | Status: DC
  Administered 2020-01-05: 50 mg via ORAL
  Filled 2020-01-05: qty 1

## 2020-01-05 NOTE — Progress Notes (Signed)
Discharge pt to home. Discharge instructions reviewed with Pt and Pt's daughter and verbalized understanding.

## 2020-01-05 NOTE — Discharge Summary (Signed)
Physician Discharge Summary  Barbara Strickland KWI:[id number] DOB: 13-Oct-1960 DOA: 01/03/2020  PCP: Patient, No Pcp Per  Admit date: 01/03/2020 Discharge date: 01/05/2020  Time spent: 40 minutes  Recommendations for Outpatient Follow-up:  1. Follow outpatient CBC/CMP 2. Follow with urology outpatient for definitive treatment of stone 3. Continue bactrim x 14 days   Discharge Diagnoses:  Active Problems:   Kidney stone on right side   Acute lower UTI   Essential hypertension   Hydronephrosis   Hypokalemia   Sepsis ([place])   Prolonged QT interval   Acute cystitis with hematuria   Discharge Condition: stable  Diet recommendation: heart healthy  Filed Weights   01/03/20 1330  Weight: 70.3 kg    History of present illness:  59 yo [name] with hx of HTN, kidney stones, HLD who presented with sepsis 2/2 UTI with right sided hydronephrosis and hydroureter.  [name] now s/p stent placement with urology.  [name] growing pan sensitive e. Coli.  Plan for discharge with 2 weeks of bactrim.    See below for additional details  Hospital Course:  Sepsis 2/2 UTI with obstructing 4 mm stone Right Sided hydronephrosis and hydroureter E. Coli UTI  Sepsis ruled in, fever, tachypnea, UTI CT with R sided hydro 2/2 4 mm R UVJ calculus Urine cx with >100,000 e. Coli - pansensitive S/p cystoscopy with R JJ stent placement, R retrograde pyelogram with intraoperative interpretation of fluoroscopic imaging Gentamicin/ceftriaxone on admission.  Deescalate to ceftriaxone alone today.  Follow final cx results. Urology c/s, appreciate recs - needs definitive treatment of distal ureteral calculus in next 2-3 weeks - discussed with urology on day of discharge, will confirm phone number in charge  Hypertension Resume home BP meds  HLD Continue lipitor  Thrombocytopenia Mild, follow ctm  Hyperglycemia Improved today, a1c 5.5 10/2019  Procedures: S/p cystoscopy with R JJ stent  placement, R retrograde pyelogram with intraoperative interpretation of fluoroscopic imaging  Consultations:  urology  Discharge Exam: Vitals:   01/04/20 2119 01/05/20 0711  BP: 140/82 (!) 178/103  Pulse: 66 (!) 55  Resp: 18 20  Temp: 97.6 F (36.4 C) (!) 97.5 F (36.4 C)  SpO2: 97% 100%   No new complaints, feeling better, had some bloody urine which cleared this AM, now normal Pain resolved Daughter at bedside assisting with interpretation  General: No acute distress. Cardiovascular: Heart sounds show Barbara Strickland regular rate, and rhythm. Lungs: Clear to auscultation bilaterally  Abdomen: Soft, nontender, nondistended.  No CVA tenderness. Neurological: Alert and oriented 3. Moves all extremities 4. Cranial nerves II through XII grossly intact. Skin: Warm and dry. No rashes or lesions. Extremities: No clubbing or cyanosis. No edema.   Discharge Instructions   Discharge Instructions    Call MD for:  difficulty breathing, headache or visual disturbances   Complete by: As directed    Call MD for:  extreme fatigue   Complete by: As directed    Call MD for:  hives   Complete by: As directed    Call MD for:  persistant dizziness or light-headedness   Complete by: As directed    Call MD for:  persistant nausea and vomiting   Complete by: As directed    Call MD for:  redness, tenderness, or signs of infection (pain, swelling, redness, odor or green/yellow discharge around incision site)   Complete by: As directed    Call MD for:  severe uncontrolled pain   Complete by: As directed    Call MD for:  temperature >100.4   Complete by: As directed    Diet - low sodium heart healthy   Complete by: As directed    Discharge instructions   Complete by: As directed    You were seen for Barbara Strickland urine infection with an obstructing stone.  You've improved with antibiotics and stent placement.  You'll need to follow up in 2-3 weeks with urology for definitive treatment of the stone.  We'll send you  home with 14 days of antibiotics, bactrim (you should not drink alcohol while taking this medicine).  Return for new, recurrent, or worsening symptoms.  Please ask your PCP to request records from this hospitalization so they know what was done and what the next steps will be.   Increase activity slowly   Complete by: As directed      Allergies as of 01/05/2020   No Known Allergies     Medication List    TAKE these medications   amLODipine 5 MG tablet Commonly known as: NORVASC Take 1 tablet (5 mg total) by mouth daily. What changed: Another medication with the same name was removed. Continue taking this medication, and follow the directions you see here.   atorvastatin 40 MG tablet Commonly known as: LIPITOR Take 40 mg by mouth daily. What changed: Another medication with the same name was removed. Continue taking this medication, and follow the directions you see here.   diclofenac sodium 1 % Gel Commonly known as: VOLTAREN Apply 2 g topically 4 (four) times daily.   losartan 50 MG tablet Commonly known as: COZAAR Take 1 tablet (50 mg total) by mouth daily. What changed: Another medication with the same name was removed. Continue taking this medication, and follow the directions you see here.   sulfamethoxazole-trimethoprim 800-160 MG tablet Commonly known as: BACTRIM DS Take 1 tablet by mouth 2 (two) times daily for 14 days.      No Known Allergies  Follow-up Information    Ceasar Mons, MD In 2 weeks.   Specialty: Urology Why: my office will call to arrange your follow-up visit Contact information: [address] [place] [address] [phone]                The results of significant diagnostics from this hospitalization (including imaging, microbiology, ancillary and laboratory) are listed below for reference.    Significant Diagnostic Studies: DG C-Arm 1-60 Min-No Report  Result Date: 01/03/2020 Fluoroscopy was  utilized by the requesting physician.  No radiographic interpretation.   CT Renal Stone Study  Result Date: 01/03/2020 CLINICAL DATA:  Flank pain.  Fever.  Evaluate for kidney stone. EXAM: CT ABDOMEN AND PELVIS WITHOUT CONTRAST TECHNIQUE: Multidetector CT imaging of the abdomen and pelvis was performed following the standard protocol without IV contrast. COMPARISON:  None. FINDINGS: Lower chest: No acute abnormality. Hepatobiliary: No focal liver abnormality identified. Small stones noted within the dependent portion of the gallbladder measuring up to 4 mm. No signs of gallbladder wall inflammation or bile duct dilatation. Pancreas: Unremarkable. No pancreatic ductal dilatation or surrounding inflammatory changes. Spleen: Normal in size without focal abnormality. Adrenals/Urinary Tract: There are multiple calcifications within the posterior and lateral upper pole calices of the right kidney. Central right upper pole stone measures 2.4 cm. Right-sided hydronephrosis and hydroureter noted. At the right UVJ there is Daivon Rayos small stone measuring 4 mm, image 74/2. No left renal calculi or left-sided hydronephrosis or hydroureter. The left renal collecting system appears duplicated. Stomach/Bowel: Stomach is within normal  limits. Appendix appears normal. No evidence of bowel wall thickening, distention, or inflammatory changes. Vascular/Lymphatic: Normal appearance of the abdominal aorta. No aneurysm. No abdominopelvic adenopathy identified. Reproductive: Uterus and bilateral adnexa are unremarkable. Other: No free fluid or fluid collections Musculoskeletal: No acute or significant osseous findings. IMPRESSION: 1. Right-sided hydronephrosis and hydroureter secondary to 4 mm right UVJ calculus. 2. Multiple large upper pole right renal calculi. 3. Gallstones. Electronically Signed   By: Kerby Moors M.D.   On: 01/03/2020 15:27    Microbiology: Recent Results (from the past 240 hour(s))  Urine culture     Status:  Abnormal   Collection Time: 01/03/20  1:31 PM   Specimen: Urine, Random  Result Value Ref Range Status   Specimen Description   Final    URINE, RANDOM Performed at [facility] [address], [place], [place] [address]    Special Requests   Final    NONE Performed at [facility], [place] [address], [place], [place] [address]    Culture >=100,000 COLONIES/mL ESCHERICHIA COLI (Otilia Kareem)  Final   Report Status 01/05/2020 FINAL  Final   Organism ID, Bacteria ESCHERICHIA COLI (Vivi Piccirilli)  Final      Susceptibility   Escherichia coli - MIC*    AMPICILLIN 4 SENSITIVE Sensitive     CEFAZOLIN <=4 SENSITIVE Sensitive     CEFEPIME <=0.12 SENSITIVE Sensitive     CEFTRIAXONE <=0.25 SENSITIVE Sensitive     CIPROFLOXACIN <=0.25 SENSITIVE Sensitive     GENTAMICIN <=1 SENSITIVE Sensitive     IMIPENEM <=0.25 SENSITIVE Sensitive     NITROFURANTOIN <=16 SENSITIVE Sensitive     TRIMETH/SULFA <=20 SENSITIVE Sensitive     AMPICILLIN/SULBACTAM <=2 SENSITIVE Sensitive     PIP/TAZO <=4 SENSITIVE Sensitive     * >=100,000 COLONIES/mL ESCHERICHIA COLI  Blood culture (routine x 2)     Status: None (Preliminary result)   Collection Time: 01/03/20  2:30 PM   Specimen: BLOOD  Result Value Ref Range Status   Specimen Description   Final    BLOOD RIGHT ANTECUBITAL Performed at [facility] [address], [place], [place] [address]    Special Requests   Final    BOTTLES DRAWN AEROBIC AND ANAEROBIC Blood Culture adequate volume Performed at [facility] [address], [place], [place] [address]    Culture   Final    NO GROWTH 2 DAYS Performed at [facility] [address] [place], [place] [address]    Report Status PENDING  Incomplete  Blood culture (routine x 2)     Status: None (Preliminary result)   Collection Time: 01/03/20  2:30 PM   Specimen: BLOOD  Result Value Ref Range Status   Specimen Description   Final    BLOOD  RIGHT WRIST Performed at [facility] [address] [place], [place] [address]    Special Requests   Final    BOTTLES DRAWN AEROBIC ONLY Blood Culture results may not be optimal due to an inadequate volume of blood received in culture bottles Performed at [facility] [address] [place], [place] [address]    Culture   Final    NO GROWTH 2 DAYS Performed at [facility] [address] [place],  [address]    Report Status PENDING  Incomplete  Resp Panel by RT-PCR (Flu Aden Sek&B, Covid) Nasopharyngeal Swab     Status: None   Collection Time: 01/03/20  3:28 PM   Specimen: Nasopharyngeal Swab; Nasopharyngeal(NP) swabs in vial transport medium  Result Value Ref Range Status   SARS Coronavirus 2 by RT PCR NEGATIVE NEGATIVE Final    Comment: (NOTE) SARS-CoV-2 target nucleic acids are NOT DETECTED.  The SARS-CoV-2 RNA is generally detectable in upper respiratory specimens during the acute phase of infection. The lowest concentration of SARS-CoV-2 viral copies this assay can detect is 138 copies/mL. Rebacca Votaw negative result does not preclude SARS-Cov-2 infection and should not be used as the sole basis for treatment or other patient management decisions. Lanice Folden negative result may occur with  improper specimen collection/handling, submission of specimen other than nasopharyngeal swab, presence of viral mutation(s) within the areas targeted by this assay, and inadequate number of viral copies(<138 copies/mL). Imya Mance negative result must be combined with clinical observations, patient history, and epidemiological information. The expected result is Negative.  Fact Sheet for Patients:  EntrepreneurPulse.com.au  Fact Sheet for Healthcare Providers:  IncredibleEmployment.be  This test is no t yet approved or cleared by the [place] FDA and  has been authorized for detection and/or diagnosis of SARS-CoV-2 by FDA  under an Emergency Use Authorization (EUA). This EUA will remain  in effect (meaning this test can be used) for the duration of the COVID-19 declaration under Section 564(b)(1) of the Act, 21 U.S.C.section 360bbb-3(b)(1), unless the authorization is terminated  or revoked sooner.       Influenza Aiya Keach by PCR NEGATIVE NEGATIVE Final   Influenza B by PCR NEGATIVE NEGATIVE Final    Comment: (NOTE) The Xpert Xpress SARS-CoV-2/FLU/RSV plus assay is intended as an aid in the diagnosis of influenza from Nasopharyngeal swab specimens and should not be used as Zebedee Segundo sole basis for treatment. Nasal washings and aspirates are unacceptable for Xpert Xpress SARS-CoV-2/FLU/RSV testing.  Fact Sheet for Patients: EntrepreneurPulse.com.au  Fact Sheet for Healthcare Providers: IncredibleEmployment.be  This test is not yet approved or cleared by the [place] FDA and has been authorized for detection and/or diagnosis of SARS-CoV-2 by FDA under an Emergency Use Authorization (EUA). This EUA will remain in effect (meaning this test can be used) for the duration of the COVID-19 declaration under Section 564(b)(1) of the Act, 21 U.S.C. section 360bbb-3(b)(1), unless the authorization is terminated or revoked.  Performed at [facility], [place] [address], [place], [place] [address]      Labs: Basic Metabolic Panel: Recent Labs  Lab 01/03/20 1430 01/03/20 2001 01/04/20 0420 01/05/20 0529  NA 138 140 139 142  K 3.3* 3.2* 4.4 4.0  CL 103 109 109 114*  CO2 25 20* 19* 20*  GLUCOSE 108* 121* 154* 97  BUN 20 15 17 22   CREATININE 0.88 0.83 0.83 0.88  CALCIUM 8.4* 7.9* 8.2* 8.5*  MG  --  2.0 2.0 2.0  PHOS  --   --  4.1 3.5   Liver Function Tests: Recent Labs  Lab 01/03/20 1430 01/04/20 0420 01/05/20 0529  AST 20 18 19   ALT 17 16 15   ALKPHOS 45 37* 32*  BILITOT 1.0 0.8 0.5  PROT 7.9 7.0 6.3*  ALBUMIN 3.9 3.4* 3.2*   No results for  input(s): LIPASE, AMYLASE in the last 168 hours. No results for input(s): AMMONIA in the last 168 hours. CBC: Recent Labs  Lab 01/03/20 1430 01/04/20 0420 01/05/20 0529  WBC 8.7 6.6 8.5  NEUTROABS 6.3 5.9 5.6  HGB 11.6* 10.8* 10.3*  HCT 38.0 35.4* 34.1*  MCV 68.1* 67.3* 68.3*  PLT 129* 114* 125*  Cardiac Enzymes: No results for input(s): CKTOTAL, CKMB, CKMBINDEX, TROPONINI in the last 168 hours. BNP: BNP (last 3 results) No results for input(s): BNP in the last 8760 hours.  ProBNP (last 3 results) No results for input(s): PROBNP in the last 8760 hours.  CBG: Recent Labs  Lab 01/05/20 0728 01/05/20 1215  GLUCAP 88 94       Signed:  Fayrene Helper MD.  [facility] 01/05/2020, 1:16 PM

## 2020-01-05 NOTE — Plan of Care (Signed)
  Problem: Education: Goal: Knowledge of General Education information will improve Description: Including pain rating scale, medication(s)/side effects and non-pharmacologic comfort measures 01/05/2020 1537 by Zadie Rhine, RN Outcome: Adequate for Discharge 01/05/2020 1537 by Zadie Rhine, RN Outcome: Progressing   Problem: Clinical Measurements: Goal: Ability to maintain clinical measurements within normal limits will improve Outcome: Adequate for Discharge Goal: Will remain free from infection Outcome: Adequate for Discharge Goal: Diagnostic test results will improve Outcome: Adequate for Discharge Goal: Respiratory complications will improve Outcome: Adequate for Discharge   Problem: Activity: Goal: Risk for activity intolerance will decrease Outcome: Adequate for Discharge

## 2020-01-05 NOTE — Plan of Care (Signed)
  Problem: Education: Goal: Knowledge of General Education information will improve Description Including pain rating scale, medication(s)/side effects and non-pharmacologic comfort measures Outcome: Progressing   Problem: Health Behavior/Discharge Planning: Goal: Ability to manage health-related needs will improve Outcome: Progressing   

## 2020-01-08 LAB — CULTURE, BLOOD (ROUTINE X 2)
Culture: NO GROWTH
Culture: NO GROWTH
Special Requests: ADEQUATE

## 2020-01-22 ENCOUNTER — Other Ambulatory Visit: Payer: Self-pay | Admitting: Urology

## 2020-02-02 ENCOUNTER — Ambulatory Visit: Payer: Medicaid Other | Admitting: Emergency Medicine

## 2020-02-04 ENCOUNTER — Encounter (HOSPITAL_BASED_OUTPATIENT_CLINIC_OR_DEPARTMENT_OTHER): Payer: Self-pay | Admitting: Physician Assistant

## 2020-02-04 ENCOUNTER — Encounter (HOSPITAL_BASED_OUTPATIENT_CLINIC_OR_DEPARTMENT_OTHER): Payer: Self-pay | Admitting: Urology

## 2020-02-04 ENCOUNTER — Ambulatory Visit: Payer: Medicaid Other | Admitting: Emergency Medicine

## 2020-02-04 ENCOUNTER — Other Ambulatory Visit: Payer: Self-pay

## 2020-02-04 NOTE — Progress Notes (Addendum)
Spoke w/ via phone for pre-op interview---patient daughter Barbara Strickland h due to patient speaks Barbara Strickland , daughter to interpret for patient day of surgery Lab needs dos----    I stat 8           COVID test ------02-08-2020  800 am Arrive at -------1030 am 02-09-2020 NPO after MN NO Solid Food.   warer from MN until---930 am then npo Medications to take morning of surgery -----atorvastatin,  amlodipine Diabetic medication -----n/a Patient Special Instructions -----none Pre-Op special Istructions -----none Patient verbalized understanding of instructions that were given at this phone interview. Patient denies shortness of breath, chest pain, fever, cough at this phone interview.  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  Chart to [place] zanetto pa for review Addendum: patient meets [facility] guidelines per Shanda Bumps zanetto pa  PCP: dr Irving Shows sagardia Barbara Strickland 11-03-2019 epic Cardiologist :none Chest x-ray :none EKG :01-04-2020 epic Echo :none Stress test:none Cardiac Cath : none Activity level: walks in home with cane, no sob or chest pain with ambulation, can climb few steps without difficulty per daughter, does not do housework Sleep Study/ CPAP :none Fasting Blood Sugar :      / Checks Blood Sugar -- times a day:  n/a Blood Thinner/ Instructions /Last Dose:n/a ASA / Instructions/ Last Dose : na/

## 2020-02-08 ENCOUNTER — Other Ambulatory Visit (HOSPITAL_COMMUNITY)
Admission: RE | Admit: 2020-02-08 | Discharge: 2020-02-08 | Disposition: A | Discharge status: Home / Self Care | Payer: Medicaid Other | Source: Ambulatory Visit | Attending: Urology | Admitting: Urology

## 2020-02-08 DIAGNOSIS — U071 COVID-19: Secondary | ICD-10-CM | POA: Insufficient documentation

## 2020-02-08 DIAGNOSIS — Z01812 Encounter for preprocedural laboratory examination: Secondary | ICD-10-CM | POA: Insufficient documentation

## 2020-02-08 HISTORY — DX: COVID-19: U07.1

## 2020-02-08 LAB — SARS CORONAVIRUS 2 (TAT 6-24 HRS): SARS Coronavirus 2: POSITIVE — AB

## 2020-02-08 NOTE — Progress Notes (Signed)
Noted this patient had a resulted covid test , is was positive.  Called and notified Dr Alphonsa Overall, OR scheduler Coni she will let Dr Liliane Shi know and reschedule patient.

## 2020-02-09 ENCOUNTER — Ambulatory Visit (HOSPITAL_BASED_OUTPATIENT_CLINIC_OR_DEPARTMENT_OTHER): Admission: RE | Admit: 2020-02-09 | Payer: Medicaid Other | Source: Home / Self Care | Admitting: Urology

## 2020-02-09 HISTORY — DX: Illiteracy and low-level literacy: Z55.0

## 2020-02-09 HISTORY — DX: Personal history of urinary calculi: Z87.442

## 2020-02-09 SURGERY — CYSTOSCOPY/URETEROSCOPY/HOLMIUM LASER/STENT PLACEMENT
Anesthesia: General | Laterality: Right

## 2020-02-22 ENCOUNTER — Encounter: Payer: Self-pay | Admitting: Emergency Medicine

## 2020-02-22 ENCOUNTER — Telehealth (INDEPENDENT_AMBULATORY_CARE_PROVIDER_SITE_OTHER): Payer: Medicaid Other | Admitting: Emergency Medicine

## 2020-02-22 ENCOUNTER — Telehealth: Payer: Self-pay | Admitting: Emergency Medicine

## 2020-02-22 ENCOUNTER — Other Ambulatory Visit: Payer: Self-pay

## 2020-02-22 DIAGNOSIS — I10 Essential (primary) hypertension: Secondary | ICD-10-CM | POA: Diagnosis not present

## 2020-02-22 DIAGNOSIS — Z8673 Personal history of transient ischemic attack (TIA), and cerebral infarction without residual deficits: Secondary | ICD-10-CM

## 2020-02-22 DIAGNOSIS — G8114 Spastic hemiplegia affecting left nondominant side: Secondary | ICD-10-CM | POA: Diagnosis not present

## 2020-02-22 DIAGNOSIS — I7090 Unspecified atherosclerosis: Secondary | ICD-10-CM

## 2020-02-22 DIAGNOSIS — E785 Hyperlipidemia, unspecified: Secondary | ICD-10-CM | POA: Diagnosis not present

## 2020-02-22 NOTE — Telephone Encounter (Signed)
Called pt and sch appt 

## 2020-02-22 NOTE — Telephone Encounter (Signed)
Noted  

## 2020-02-22 NOTE — Progress Notes (Signed)
Telemedicine Encounter- SOAP NOTE Established Patient Patient: Home  Provider: Office     This telephone encounter was conducted with the patient's (or proxy's) verbal consent via audio telecommunications: yes Patient was instructed to have this encounter in a suitably private space; and to only have persons present to whom they give permission to participate. In addition, patient identity was confirmed by use of name plus two identifiers (DOB and address).  I discussed the limitations, risks, security and privacy concerns of performing an evaluation and management service by telephone and the availability of in person appointments. I also discussed with the patient that there may be a patient responsible charge related to this service. The patient expressed understanding and agreed to proceed.  I spent a total of 20 minutes talking with the patient or their proxy.  Chief Complaint  Patient presents with  . Medical Management of Chronic Issues    3 m f/u  BP    Subjective   Barbara Strickland is a 60 y.o. female established patient. Telephone visit today for follow-up of hypertension. Spoke to patient and daughter who is helping with translation. 1. Hypertension: On amlodipine 5 mg on losartan 50 mg daily. Daughter states blood pressure readings have remained high. Instructed to increase dose of losartan to 100 mg daily. BP Readings from Last 3 Encounters:  01/05/20 (!) 191/112  11/03/19 (!) 190/110  10/03/17 (!) 135/94   2. History of hemorrhagic stroke with left-sided spastic hemiplegia. 3. History of dyslipidemia on atorvastatin. 4. History of kidney stones with recent passage. Recent hospitalization on 2020-01-03 due to infected right sided kidney stone with sepsis. Did well. No complaints or any other medical concerns today.   HPI   Patient Active Problem List   Diagnosis Date Noted  . Ureterolithiasis   . Acute cystitis with hematuria   . Kidney stone on right side  01/03/2020  . Acute lower UTI 01/03/2020  . Essential hypertension 01/03/2020  . Hydronephrosis 01/03/2020  . Hypokalemia 01/03/2020  . Sepsis ([place]) 01/03/2020  . Prolonged QT interval 01/03/2020  . Dyslipidemia 11/03/2019  . Hx of adenomatous colonic polyps 08/05/2017  . Left spastic hemiparesis ([place]) 05/15/2017  . Hemorrhagic stroke ([place]) 06/21/2015  . Essential hypertension 06/21/2015  . Medical non-compliance 06/21/2015    Past Medical History:  Diagnosis Date  . 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 Hemorrhagic stroke ([place]) 2009  . History of kidney stones   . Hx of adenomatous colonic polyps 08/05/2017  . Hypertension   . Illiterate    pt cannot read/write english, daughter chien h signs consents  . 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 Microcytic anemia     Current Outpatient Medications  Medication Sig Dispense Refill  . amLODipine (NORVASC) 5 MG tablet Take 1 tablet (5 mg total) by mouth daily. 90 tablet 3  . atorvastatin (LIPITOR) 40 MG tablet Take 40 mg by mouth daily.    Marland Kitchen losartan (COZAAR) 50 MG tablet Take 1 tablet (50 mg total) by mouth daily. 90 tablet 3   No current facility-administered medications for this visit.    No Known Allergies  Social History   Socioeconomic History  . Marital status: Married    Spouse name: Not on file  . Number of children: 4  . Years of education: Not on file  . Highest education level: Not on file  Occupational History  . Not on file  Tobacco Use  . Smoking status: Never Smoker  . Smokeless  tobacco: Never Used  Vaping Use  . Vaping Use: Never used  Substance and Sexual Activity  . Alcohol use: Never  . Drug use: Never  . Sexual activity: Not on file  Other Topics Concern  . Not on file  Social History Narrative   ** Merged History Encounter **       Social Determinants of Health   Financial Resource Strain: Not on file  Food Insecurity: Not on file  Transportation Needs: Not on file  Physical Activity:  Not on file  Stress: Not on file  Social Connections: Not on file  Intimate Partner Violence: Not on file    Review of Systems  Constitutional: Negative.  Negative for chills and fever.  HENT: Negative.  Negative for congestion and sore throat.   Respiratory: Negative.  Negative for cough and shortness of breath.   Cardiovascular: Negative.  Negative for chest pain and palpitations.  Gastrointestinal: Negative.  Negative for abdominal pain, diarrhea, nausea and vomiting.  Genitourinary: Negative.  Negative for dysuria and hematuria.  Musculoskeletal: Negative.   Skin: Negative.  Negative for rash.  Neurological: Negative.  Negative for dizziness, speech change and headaches.  All other systems reviewed and are negative.   Objective  Alert and oriented x3 in no apparent respiratory distress. Vitals as reported by the patient: There were no vitals filed for this visit.  There are no diagnoses linked to this encounter.  Zabria was seen today for medical management of chronic issues.  Diagnoses and all orders for this visit:  Essential hypertension  Left spastic hemiparesis ([place])  Dyslipidemia  Atherosclerotic vascular disease  History of stroke     Per daughter, her blood pressure remains high. Advised to increase losartan to 100 mg daily. Continue amlodipine 5 mg daily. Advised to continue monitoring blood pressure readings at home. Office visit in the next couple weeks.as I discussed the assessment and treatment plan with the patient. The patient was provided an opportunity to ask questions and all were answered. The patient agreed with the plan and demonstrated an understanding of the instructions.   The patient was advised to call back or seek an in-person evaluation if the symptoms worsen or if the condition fails to improve as anticipated.  I provided 20 minutes of non-face-to-face time during this encounter.  Horald Pollen, MD  Primary Care at [facility]

## 2020-02-22 NOTE — Patient Instructions (Signed)
° ° ° °  If you have lab work done today you will be contacted with your lab results within the next 2 weeks.  If you have not heard from us then please contact us. The fastest way to get your results is to register for My Chart. ° ° °IF you received an x-ray today, you will receive an invoice from [facility]. Please contact  Radiology at [phone] with questions or concerns regarding your invoice.  ° °IF you received labwork today, you will receive an invoice from [facility]. Please contact [facility] at [phone] with questions or concerns regarding your invoice.  ° °Our billing staff will not be able to assist you with questions regarding bills from these companies. ° °You will be contacted with the lab results as soon as they are available. The fastest way to get your results is to activate your My Chart account. Instructions are located on the last page of this paperwork. If you have not heard from us regarding the results in 2 weeks, please contact this office. °  ° ° ° °

## 2020-03-03 ENCOUNTER — Other Ambulatory Visit: Payer: Self-pay | Admitting: Urology

## 2020-03-07 ENCOUNTER — Encounter: Payer: Self-pay | Admitting: Emergency Medicine

## 2020-03-07 ENCOUNTER — Other Ambulatory Visit: Payer: Self-pay

## 2020-03-07 ENCOUNTER — Ambulatory Visit (INDEPENDENT_AMBULATORY_CARE_PROVIDER_SITE_OTHER): Payer: Medicaid Other | Admitting: Emergency Medicine

## 2020-03-07 VITALS — BP 124/84 | HR 67 | Temp 98.6°F | Resp 16 | Ht 59.0 in | Wt 129.0 lb

## 2020-03-07 DIAGNOSIS — G8114 Spastic hemiplegia affecting left nondominant side: Secondary | ICD-10-CM | POA: Diagnosis not present

## 2020-03-07 DIAGNOSIS — Z8673 Personal history of transient ischemic attack (TIA), and cerebral infarction without residual deficits: Secondary | ICD-10-CM

## 2020-03-07 DIAGNOSIS — I10 Essential (primary) hypertension: Secondary | ICD-10-CM | POA: Diagnosis not present

## 2020-03-07 DIAGNOSIS — I7090 Unspecified atherosclerosis: Secondary | ICD-10-CM | POA: Diagnosis not present

## 2020-03-07 DIAGNOSIS — Z1231 Encounter for screening mammogram for malignant neoplasm of breast: Secondary | ICD-10-CM

## 2020-03-07 LAB — COMPREHENSIVE METABOLIC PANEL
ALT: 20 IU/L (ref 0–32)
AST: 24 IU/L (ref 0–40)
Albumin/Globulin Ratio: 1.5 (ref 1.2–2.2)
Albumin: 4.6 g/dL (ref 3.8–4.8)
Alkaline Phosphatase: 61 IU/L (ref 44–121)
BUN/Creatinine Ratio: 17 (ref 12–28)
BUN: 18 mg/dL (ref 8–27)
Bilirubin Total: 0.9 mg/dL (ref 0.0–1.2)
CO2: 23 mmol/L (ref 20–29)
Calcium: 9.1 mg/dL (ref 8.7–10.3)
Chloride: 105 mmol/L (ref 96–106)
Creatinine, Ser: 1.08 mg/dL — ABNORMAL HIGH (ref 0.57–1.00)
GFR calc Af Amer: 64 mL/min/{1.73_m2} (ref >59)
GFR calc non Af Amer: 55 mL/min/{1.73_m2} — ABNORMAL LOW (ref >59)
Globulin, Total: 3 g/dL (ref 1.5–4.5)
Glucose: 87 mg/dL (ref 65–99)
Potassium: 4 mmol/L (ref 3.5–5.2)
Sodium: 143 mmol/L (ref 134–144)
Total Protein: 7.6 g/dL (ref 6.0–8.5)

## 2020-03-07 LAB — LIPID PANEL
Chol/HDL Ratio: 2 ratio (ref 0.0–4.4)
Cholesterol, Total: 192 mg/dL (ref 100–199)
HDL: 96 mg/dL (ref >39)
LDL Chol Calc (NIH): 84 mg/dL (ref 0–99)
Triglycerides: 65 mg/dL (ref 0–149)
VLDL Cholesterol Cal: 12 mg/dL (ref 5–40)

## 2020-03-07 MED ORDER — AMLODIPINE BESYLATE 10 MG PO TABS
10.0000 mg | ORAL_TABLET | Freq: Every day | ORAL | 3 refills | Status: DC
Start: 2020-03-07 — End: 2021-08-14

## 2020-03-07 MED ORDER — LOSARTAN POTASSIUM 50 MG PO TABS: 50.0000 mg | ORAL_TABLET | Freq: Every day | ORAL | 3 refills | Status: DC

## 2020-03-07 NOTE — Progress Notes (Signed)
Barbara Strickland 60 y.o.   Chief Complaint  Patient presents with  . Hypertension    Follow up 2 weeks    HISTORY OF PRESENT ILLNESS: This is a 60 y.o. female with history of hypertension here for follow-up. Presently taking 10 mg of amlodipine daily and 50 mg of losartan daily. Also taking atorvastatin 40 mg daily. Doing well.  Here with her daughter helping with translation. Has no complaints or medical concerns today.  HPI   Prior to Admission medications   Medication Sig Start Date End Date Taking? Authorizing Provider  amLODipine (NORVASC) 5 MG tablet Take 1 tablet (5 mg total) by mouth daily. 11/03/19  Yes Sophy Mesler, Ines Bloomer, MD  atorvastatin (LIPITOR) 40 MG tablet Take 40 mg by mouth daily.   Yes [provider]  losartan (COZAAR) 50 MG tablet Take 1 tablet (50 mg total) by mouth daily. 11/03/19 02/01/20  Horald Pollen, MD    No Known Allergies  Patient Active Problem List   Diagnosis Date Noted  . Ureterolithiasis   . Kidney stone on right side 01/03/2020  . Essential hypertension 01/03/2020  . Hydronephrosis 01/03/2020  . Hypokalemia 01/03/2020  . Prolonged QT interval 01/03/2020  . Dyslipidemia 11/03/2019  . Hx of adenomatous colonic polyps 08/05/2017  . Left spastic hemiparesis ([place]) 05/15/2017  . Hemorrhagic stroke ([place]) 06/21/2015  . Essential hypertension 06/21/2015    Past Medical History:  Diagnosis Date  . 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 Hemorrhagic stroke ([place]) 2009  . History of kidney stones   . Hx of adenomatous colonic polyps 08/05/2017  . Hypertension   . Illiterate    pt cannot read/write english, daughter chien h signs consents  . 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 Microcytic anemia     Past Surgical History:  Procedure Laterality Date  . CYSTOSCOPY W/ URETERAL STENT PLACEMENT Right 01/03/2020   Procedure: CYSTOSCOPY WITH RETROGRADE PYELOGRAM/URETERAL STENT PLACEMENT;  Surgeon: Ceasar Mons, MD;   Location: [facility];  Service: Urology;  Laterality: Right;    Social History   Socioeconomic History  . Marital status: Married    Spouse name: Not on file  . Number of children: 4  . Years of education: Not on file  . Highest education level: Not on file  Occupational History  . Not on file  Tobacco Use  . Smoking status: Never Smoker  . Smokeless tobacco: Never Used  Vaping Use  . Vaping Use: Never used  Substance and Sexual Activity  . Alcohol use: Never  . Drug use: Never  . Sexual activity: Not on file  Other Topics Concern  . Not on file  Social History Narrative   ** Merged History Encounter **       Social Determinants of Health   Financial Resource Strain: Not on file  Food Insecurity: Not on file  Transportation Needs: Not on file  Physical Activity: Not on file  Stress: Not on file  Social Connections: Not on file  Intimate Partner Violence: Not on file    Family History  Problem Relation Age of Onset  . Hypertension Neg Hx   . Hyperlipidemia Neg Hx   . Colon cancer Neg Hx   . Colon polyps Neg Hx   . Esophageal cancer Neg Hx   . Rectal cancer Neg Hx   . Stomach cancer Neg Hx      Review of Systems  Constitutional: Negative.  Negative for chills and fever.  HENT: Negative.  Negative for congestion and sore throat.   Respiratory: Negative.  Negative for cough and shortness of breath.   Cardiovascular: Negative.  Negative for chest pain and palpitations.  Gastrointestinal: Negative.  Negative for abdominal pain, diarrhea, nausea and vomiting.  Genitourinary: Negative.  Negative for dysuria and hematuria.  Musculoskeletal: Negative.  Negative for myalgias.  Skin: Negative.  Negative for rash.  Neurological: Negative.  Negative for dizziness and headaches.  All other systems reviewed and are negative.    Physical Exam Vitals reviewed.  Constitutional:      Appearance: Normal appearance.  HENT:     Head: Normocephalic.  Eyes:     Extraocular  Movements: Extraocular movements intact.     Pupils: Pupils are equal, round, and reactive to light.  Cardiovascular:     Rate and Rhythm: Normal rate and regular rhythm.     Pulses: Normal pulses.     Heart sounds: Normal heart sounds.  Pulmonary:     Effort: Pulmonary effort is normal.     Breath sounds: Normal breath sounds.  Musculoskeletal:        General: Normal range of motion.     Cervical back: Normal range of motion and neck supple.  Skin:    General: Skin is warm and dry.  Neurological:     Mental Status: She is alert and oriented to person, place, and time. Mental status is at baseline.     Comments: Left-sided spastic hemiparesis  Psychiatric:        Mood and Affect: Mood normal.        Behavior: Behavior normal.    A total of 30 minutes was spent with the patient, greater than 50% of which was in counseling/coordination of care regarding hypertension and cardiovascular risk associated with this condition, review of all medications and doses changes, review of most recent office visit notes, review of most recent blood work results, education on nutrition, prognosis, documentation, need for follow-up.   ASSESSMENT & PLAN: Essential hypertension Clinically stable.  Well-controlled hypertension on 10 mg of amlodipine and 50 mg of losartan daily.  Continue present medications.  No changes. Continue atorvastatin 40 mg daily. Follow-up in 6 months.  Jennalyn was seen today for hypertension.  Diagnoses and all orders for this visit:  Essential hypertension -     losartan (COZAAR) 50 MG tablet; Take 1 tablet (50 mg total) by mouth daily. -     amLODipine (NORVASC) 10 MG tablet; Take 1 tablet (10 mg total) by mouth daily. -     Comprehensive metabolic panel -     Lipid panel  Left spastic hemiparesis (HCC)  Atherosclerotic vascular disease -     Lipid panel  History of stroke  Encounter for screening mammogram for malignant neoplasm of breast -     MM Digital  Screening; Future    Patient Instructions       If you have lab work done today you will be contacted with your lab results within the next 2 weeks.  If you have not heard from [place] then please contact us. The fastest way to get your results is to register for My Chart.   IF you received an x-ray today, you will receive an invoice from [facility]. Please contact [facility] at [phone] with questions or concerns regarding your invoice.   IF you received labwork today, you will receive an invoice from [facility]. Please contact [facility] at [phone] with questions or concerns regarding your invoice.   Our billing  staff will not be able to assist you with questions regarding bills from these companies.  You will be contacted with the lab results as soon as they are available. The fastest way to get your results is to activate your My Chart account. Instructions are located on the last page of this paperwork. If you have not heard from [place] regarding the results in 2 weeks, please contact this office.     Hypertension, Adult High blood pressure (hypertension) is when the force of blood pumping through the arteries is too strong. The arteries are the blood vessels that carry blood from the heart throughout the body. Hypertension forces the heart to work harder to pump blood and may cause arteries to become narrow or stiff. Untreated or uncontrolled hypertension can cause a heart attack, heart failure, a stroke, kidney disease, and other problems. A blood pressure reading consists of a higher number over a lower number. Ideally, your blood pressure should be below 120/80. The first ("top") number is called the systolic pressure. It is a measure of the pressure in your arteries as your heart beats. The second ("bottom") number is called the diastolic pressure. It is a measure of the pressure in your arteries as the heart relaxes. What are the causes? The exact cause of this  condition is not known. There are some conditions that result in or are related to high blood pressure. What increases the risk? Some risk factors for high blood pressure are under your control. The following factors may make you more likely to develop this condition:  Smoking.  Having type 2 diabetes mellitus, high cholesterol, or both.  Not getting enough exercise or physical activity.  Being overweight.  Having too much fat, sugar, calories, or salt (sodium) in your diet.  Drinking too much alcohol. Some risk factors for high blood pressure may be difficult or impossible to change. Some of these factors include:  Having chronic kidney disease.  Having a family history of high blood pressure.  Age. Risk increases with age.  Race. You may be at higher risk if you are African American.  Gender. Men are at higher risk than women before age 49. After age 93, women are at higher risk than men.  Having obstructive sleep apnea.  Stress. What are the signs or symptoms? High blood pressure may not cause symptoms. Very high blood pressure (hypertensive crisis) may cause:  Headache.  Anxiety.  Shortness of breath.  Nosebleed.  Nausea and vomiting.  Vision changes.  Severe chest pain.  Seizures. How is this diagnosed? This condition is diagnosed by measuring your blood pressure while you are seated, with your arm resting on a flat surface, your legs uncrossed, and your feet flat on the floor. The cuff of the blood pressure monitor will be placed directly against the skin of your upper arm at the level of your heart. It should be measured at least twice using the same arm. Certain conditions can cause a difference in blood pressure between your right and left arms. Certain factors can cause blood pressure readings to be lower or higher than normal for a short period of time:  When your blood pressure is higher when you are in a health care provider's office than when you are  at home, this is called white coat hypertension. Most people with this condition do not need medicines.  When your blood pressure is higher at home than when you are in a health care provider's office, this is called masked  hypertension. Most people with this condition may need medicines to control blood pressure. If you have a high blood pressure reading during one visit or you have normal blood pressure with other risk factors, you may be asked to:  Return on a different day to have your blood pressure checked again.  Monitor your blood pressure at home for 1 week or longer. If you are diagnosed with hypertension, you may have other blood or imaging tests to help your health care provider understand your overall risk for other conditions. How is this treated? This condition is treated by making healthy lifestyle changes, such as eating healthy foods, exercising more, and reducing your alcohol intake. Your health care provider may prescribe medicine if lifestyle changes are not enough to get your blood pressure under control, and if:  Your systolic blood pressure is above 130.  Your diastolic blood pressure is above 80. Your personal target blood pressure may vary depending on your medical conditions, your age, and other factors. Follow these instructions at home: Eating and drinking  Eat a diet that is high in fiber and potassium, and low in sodium, added sugar, and fat. An example eating plan is called the DASH (Dietary Approaches to Stop Hypertension) diet. To eat this way: ? Eat plenty of fresh fruits and vegetables. Try to fill one half of your plate at each meal with fruits and vegetables. ? Eat whole grains, such as whole-wheat pasta, brown rice, or whole-grain bread. Fill about one fourth of your plate with whole grains. ? Eat or drink low-fat dairy products, such as skim milk or low-fat yogurt. ? Avoid fatty cuts of meat, processed or cured meats, and poultry with skin. Fill about  one fourth of your plate with lean proteins, such as fish, chicken without skin, beans, eggs, or tofu. ? Avoid pre-made and processed foods. These tend to be higher in sodium, added sugar, and fat.  Reduce your daily sodium intake. Most people with hypertension should eat less than 1,500 mg of sodium a day.  Do not drink alcohol if: ? Your health care provider tells you not to drink. ? You are pregnant, may be pregnant, or are planning to become pregnant.  If you drink alcohol: ? Limit how much you use to:  0-1 drink a day for women.  0-2 drinks a day for men. ? Be aware of how much alcohol is in your drink. In the [place] one drink equals one 12 oz bottle of beer (355 mL), one 5 oz glass of wine (148 mL), or one 1 oz glass of hard liquor (44 mL).   Lifestyle  Work with your health care provider to maintain a healthy body weight or to lose weight. Ask what an ideal weight is for you.  Get at least 30 minutes of exercise most days of the week. Activities may include walking, swimming, or biking.  Include exercise to strengthen your muscles (resistance exercise), such as Pilates or lifting weights, as part of your weekly exercise routine. Try to do these types of exercises for 30 minutes at least 3 days a week.  Do not use any products that contain nicotine or tobacco, such as cigarettes, e-cigarettes, and chewing tobacco. If you need help quitting, ask your health care provider.  Monitor your blood pressure at home as told by your health care provider.  Keep all follow-up visits as told by your health care provider. This is important.   Medicines  Take over-the-counter and prescription medicines only as  told by your health care provider. Follow directions carefully. Blood pressure medicines must be taken as prescribed.  Do not skip doses of blood pressure medicine. Doing this puts you at risk for problems and can make the medicine less effective.  Ask your health care provider about  side effects or reactions to medicines that you should watch for. Contact a health care provider if you:  Think you are having a reaction to a medicine you are taking.  Have headaches that keep coming back (recurring).  Feel dizzy.  Have swelling in your ankles.  Have trouble with your vision. Get help right away if you:  Develop a severe headache or confusion.  Have unusual weakness or numbness.  Feel faint.  Have severe pain in your chest or abdomen.  Vomit repeatedly.  Have trouble breathing. Summary  Hypertension is when the force of blood pumping through your arteries is too strong. If this condition is not controlled, it may put you at risk for serious complications.  Your personal target blood pressure may vary depending on your medical conditions, your age, and other factors. For most people, a normal blood pressure is less than 120/80.  Hypertension is treated with lifestyle changes, medicines, or a combination of both. Lifestyle changes include losing weight, eating a healthy, low-sodium diet, exercising more, and limiting alcohol. This information is not intended to replace advice given to you by your health care provider. Make sure you discuss any questions you have with your health care provider. Document Revised: 10/02/2017 Document Reviewed: 10/02/2017 Elsevier Patient Education  2021 [employer]      Agustina Caroli, MD [facility]

## 2020-03-07 NOTE — Patient Instructions (Addendum)
   If you have lab work done today you will be contacted with your lab results within the next 2 weeks.  If you have not heard from us then please contact us. The fastest way to get your results is to register for My Chart.   IF you received an x-ray today, you will receive an invoice from [facility]. Please contact [facility] at [phone] with questions or concerns regarding your invoice.   IF you received labwork today, you will receive an invoice from [facility]. Please contact [facility] at [phone] with questions or concerns regarding your invoice.   Our billing staff will not be able to assist you with questions regarding bills from these companies.  You will be contacted with the lab results as soon as they are available. The fastest way to get your results is to activate your My Chart account. Instructions are located on the last page of this paperwork. If you have not heard from us regarding the results in 2 weeks, please contact this office.     Hypertension, Adult High blood pressure (hypertension) is when the force of blood pumping through the arteries is too strong. The arteries are the blood vessels that carry blood from the heart throughout the body. Hypertension forces the heart to work harder to pump blood and may cause arteries to become narrow or stiff. Untreated or uncontrolled hypertension can cause a heart attack, heart failure, a stroke, kidney disease, and other problems. A blood pressure reading consists of a higher number over a lower number. Ideally, your blood pressure should be below 120/80. The first ("top") number is called the systolic pressure. It is a measure of the pressure in your arteries as your heart beats. The second ("bottom") number is called the diastolic pressure. It is a measure of the pressure in your arteries as the heart relaxes. What are the causes? The exact cause of this condition is not known. There are some conditions  that result in or are related to high blood pressure. What increases the risk? Some risk factors for high blood pressure are under your control. The following factors may make you more likely to develop this condition:  Smoking.  Having type 2 diabetes mellitus, high cholesterol, or both.  Not getting enough exercise or physical activity.  Being overweight.  Having too much fat, sugar, calories, or salt (sodium) in your diet.  Drinking too much alcohol. Some risk factors for high blood pressure may be difficult or impossible to change. Some of these factors include:  Having chronic kidney disease.  Having a family history of high blood pressure.  Age. Risk increases with age.  Race. You may be at higher risk if you are African American.  Gender. Men are at higher risk than women before age 45. After age 65, women are at higher risk than men.  Having obstructive sleep apnea.  Stress. What are the signs or symptoms? High blood pressure may not cause symptoms. Very high blood pressure (hypertensive crisis) may cause:  Headache.  Anxiety.  Shortness of breath.  Nosebleed.  Nausea and vomiting.  Vision changes.  Severe chest pain.  Seizures. How is this diagnosed? This condition is diagnosed by measuring your blood pressure while you are seated, with your arm resting on a flat surface, your legs uncrossed, and your feet flat on the floor. The cuff of the blood pressure monitor will be placed directly against the skin of your upper arm at the level of your heart.   It should be measured at least twice using the same arm. Certain conditions can cause a difference in blood pressure between your right and left arms. Certain factors can cause blood pressure readings to be lower or higher than normal for a short period of time:  When your blood pressure is higher when you are in a health care provider's office than when you are at home, this is called white coat hypertension.  Most people with this condition do not need medicines.  When your blood pressure is higher at home than when you are in a health care provider's office, this is called masked hypertension. Most people with this condition may need medicines to control blood pressure. If you have a high blood pressure reading during one visit or you have normal blood pressure with other risk factors, you may be asked to:  Return on a different day to have your blood pressure checked again.  Monitor your blood pressure at home for 1 week or longer. If you are diagnosed with hypertension, you may have other blood or imaging tests to help your health care provider understand your overall risk for other conditions. How is this treated? This condition is treated by making healthy lifestyle changes, such as eating healthy foods, exercising more, and reducing your alcohol intake. Your health care provider may prescribe medicine if lifestyle changes are not enough to get your blood pressure under control, and if:  Your systolic blood pressure is above 130.  Your diastolic blood pressure is above 80. Your personal target blood pressure may vary depending on your medical conditions, your age, and other factors. Follow these instructions at home: Eating and drinking  Eat a diet that is high in fiber and potassium, and low in sodium, added sugar, and fat. An example eating plan is called the DASH (Dietary Approaches to Stop Hypertension) diet. To eat this way: ? Eat plenty of fresh fruits and vegetables. Try to fill one half of your plate at each meal with fruits and vegetables. ? Eat whole grains, such as whole-wheat pasta, brown rice, or whole-grain bread. Fill about one fourth of your plate with whole grains. ? Eat or drink low-fat dairy products, such as skim milk or low-fat yogurt. ? Avoid fatty cuts of meat, processed or cured meats, and poultry with skin. Fill about one fourth of your plate with lean proteins, such  as fish, chicken without skin, beans, eggs, or tofu. ? Avoid pre-made and processed foods. These tend to be higher in sodium, added sugar, and fat.  Reduce your daily sodium intake. Most people with hypertension should eat less than 1,500 mg of sodium a day.  Do not drink alcohol if: ? Your health care provider tells you not to drink. ? You are pregnant, may be pregnant, or are planning to become pregnant.  If you drink alcohol: ? Limit how much you use to:  0-1 drink a day for women.  0-2 drinks a day for men. ? Be aware of how much alcohol is in your drink. In the [place] one drink equals one 12 oz bottle of beer (355 mL), one 5 oz glass of wine (148 mL), or one 1 oz glass of hard liquor (44 mL).   Lifestyle  Work with your health care provider to maintain a healthy body weight or to lose weight. Ask what an ideal weight is for you.  Get at least 30 minutes of exercise most days of the week. Activities may include walking, swimming, or   biking.  Include exercise to strengthen your muscles (resistance exercise), such as Pilates or lifting weights, as part of your weekly exercise routine. Try to do these types of exercises for 30 minutes at least 3 days a week.  Do not use any products that contain nicotine or tobacco, such as cigarettes, e-cigarettes, and chewing tobacco. If you need help quitting, ask your health care provider.  Monitor your blood pressure at home as told by your health care provider.  Keep all follow-up visits as told by your health care provider. This is important.   Medicines  Take over-the-counter and prescription medicines only as told by your health care provider. Follow directions carefully. Blood pressure medicines must be taken as prescribed.  Do not skip doses of blood pressure medicine. Doing this puts you at risk for problems and can make the medicine less effective.  Ask your health care provider about side effects or reactions to medicines that you  should watch for. Contact a health care provider if you:  Think you are having a reaction to a medicine you are taking.  Have headaches that keep coming back (recurring).  Feel dizzy.  Have swelling in your ankles.  Have trouble with your vision. Get help right away if you:  Develop a severe headache or confusion.  Have unusual weakness or numbness.  Feel faint.  Have severe pain in your chest or abdomen.  Vomit repeatedly.  Have trouble breathing. Summary  Hypertension is when the force of blood pumping through your arteries is too strong. If this condition is not controlled, it may put you at risk for serious complications.  Your personal target blood pressure may vary depending on your medical conditions, your age, and other factors. For most people, a normal blood pressure is less than 120/80.  Hypertension is treated with lifestyle changes, medicines, or a combination of both. Lifestyle changes include losing weight, eating a healthy, low-sodium diet, exercising more, and limiting alcohol. This information is not intended to replace advice given to you by your health care provider. Make sure you discuss any questions you have with your health care provider. Document Revised: 10/02/2017 Document Reviewed: 10/02/2017 Elsevier Patient Education  2021 [employer] Inc.  

## 2020-03-07 NOTE — Assessment & Plan Note (Signed)
Clinically stable.  Well-controlled hypertension on 10 mg of amlodipine and 50 mg of losartan daily.  Continue present medications.  No changes. Continue atorvastatin 40 mg daily. Follow-up in 6 months.

## 2020-03-08 ENCOUNTER — Other Ambulatory Visit: Payer: Self-pay

## 2020-03-08 ENCOUNTER — Encounter (HOSPITAL_BASED_OUTPATIENT_CLINIC_OR_DEPARTMENT_OTHER): Payer: Self-pay | Admitting: Urology

## 2020-03-08 NOTE — Progress Notes (Addendum)
Spoke w/ via phone for pre-op interview---pt daughter Lurline Hare h states no changes in medical history since last phone call Lab needs dos----  I stat 8             COVID test ------ positive covid test 02-08-2020 results in epic Arrive at -------1045 am 03-09-2020 NPO after MN NO Solid Food.   Water  from MN until---945 am then npo Medications to take morning of surgery -----atorvastatin, amlodipine Diabetic medication -----n/a Patient Special Instructions -----none Pre-Op special Istructions -----none Patient verbalized understanding of instructions that were given at this phone interview. 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  Chart to [place] [facility] for review Addendum: patient meets [facility] guidelines per Janett Billow [facility] pa per 02-03-2021 note  PCP: dr Kittie Plater sagardia Cassell Clement 11-03-2019 epic Cardiologist :none Chest x-ray :none EKG :01-04-2020 epic Echo :none Stress test:none Cardiac Cath : none Activity level: walks in home with cane, no sob or chest pain with ambulation, can climb few steps without difficulty per daughter, does not do housework Sleep Study/ CPAP :none Fasting Blood Sugar :      / Checks Blood Sugar -- times a day:  n/a Blood Thinner/ Instructions /Last Dose:n/a ASA / Instructions/ Last Dose : na/  Pt daughter chien h to interpret day of surgery, release to be signed

## 2020-03-09 ENCOUNTER — Ambulatory Visit (HOSPITAL_BASED_OUTPATIENT_CLINIC_OR_DEPARTMENT_OTHER): Payer: Medicaid Other | Admitting: Certified Registered"

## 2020-03-09 ENCOUNTER — Encounter (HOSPITAL_BASED_OUTPATIENT_CLINIC_OR_DEPARTMENT_OTHER)
Admission: RE | Disposition: A | Discharge status: Home / Self Care | Payer: Self-pay | Source: Home / Self Care | Attending: Urology

## 2020-03-09 ENCOUNTER — Ambulatory Visit (HOSPITAL_BASED_OUTPATIENT_CLINIC_OR_DEPARTMENT_OTHER)
Admission: RE | Admit: 2020-03-09 | Discharge: 2020-03-09 | Disposition: A | Discharge status: Home / Self Care | Payer: Medicaid Other | Attending: Urology | Admitting: Urology

## 2020-03-09 ENCOUNTER — Encounter (HOSPITAL_BASED_OUTPATIENT_CLINIC_OR_DEPARTMENT_OTHER): Payer: Self-pay | Admitting: Urology

## 2020-03-09 ENCOUNTER — Other Ambulatory Visit: Payer: Self-pay

## 2020-03-09 DIAGNOSIS — I69351 Hemiplegia and hemiparesis following cerebral infarction affecting right dominant side: Secondary | ICD-10-CM | POA: Diagnosis not present

## 2020-03-09 DIAGNOSIS — Z79899 Other long term (current) drug therapy: Secondary | ICD-10-CM | POA: Diagnosis not present

## 2020-03-09 DIAGNOSIS — N202 Calculus of kidney with calculus of ureter: Secondary | ICD-10-CM | POA: Insufficient documentation

## 2020-03-09 DIAGNOSIS — Z8616 Personal history of COVID-19: Secondary | ICD-10-CM | POA: Insufficient documentation

## 2020-03-09 HISTORY — PX: CYSTOSCOPY/URETEROSCOPY/HOLMIUM LASER/STENT PLACEMENT: SHX6546

## 2020-03-09 SURGERY — CYSTOSCOPY/URETEROSCOPY/HOLMIUM LASER/STENT PLACEMENT
Anesthesia: General | Site: Renal | Laterality: Right

## 2020-03-09 MED ORDER — DEXAMETHASONE SODIUM PHOSPHATE 10 MG/ML IJ SOLN
INTRAMUSCULAR | Status: AC
  Filled 2020-03-09: qty 1

## 2020-03-09 MED ORDER — FENTANYL CITRATE (PF) 100 MCG/2ML IJ SOLN
INTRAMUSCULAR | Status: DC | PRN
  Administered 2020-03-09 (×2): 25 ug via INTRAVENOUS

## 2020-03-09 MED ORDER — MIDAZOLAM HCL 2 MG/2ML IJ SOLN
INTRAMUSCULAR | Status: AC
  Filled 2020-03-09: qty 2

## 2020-03-09 MED ORDER — LACTATED RINGERS IV SOLN: INTRAVENOUS | Status: DC

## 2020-03-09 MED ORDER — LIDOCAINE 2% (20 MG/ML) 5 ML SYRINGE
INTRAMUSCULAR | Status: DC | PRN
  Administered 2020-03-09: 50 mg via INTRAVENOUS

## 2020-03-09 MED ORDER — FENTANYL CITRATE (PF) 100 MCG/2ML IJ SOLN
INTRAMUSCULAR | Status: AC
  Filled 2020-03-09: qty 2

## 2020-03-09 MED ORDER — CEPHALEXIN 500 MG PO CAPS: 500.0000 mg | ORAL_CAPSULE | Freq: Two times a day (BID) | ORAL | 0 refills | Status: AC

## 2020-03-09 MED ORDER — ACETAMINOPHEN 325 MG PO TABS
650.0000 mg | ORAL_TABLET | Freq: Once | ORAL | Status: AC
  Administered 2020-03-09: 650 mg via ORAL

## 2020-03-09 MED ORDER — CIPROFLOXACIN IN D5W 400 MG/200ML IV SOLN: 400.0000 mg | INTRAVENOUS | Status: DC

## 2020-03-09 MED ORDER — LIDOCAINE HCL (PF) 2 % IJ SOLN
INTRAMUSCULAR | Status: AC
  Filled 2020-03-09: qty 5

## 2020-03-09 MED ORDER — ONDANSETRON HCL 4 MG/2ML IJ SOLN
INTRAMUSCULAR | Status: AC
  Filled 2020-03-09: qty 2

## 2020-03-09 MED ORDER — SODIUM CHLORIDE 0.9 % IR SOLN
Status: DC | PRN
  Administered 2020-03-09: 6000 mL

## 2020-03-09 MED ORDER — ONDANSETRON HCL 4 MG/2ML IJ SOLN
INTRAMUSCULAR | Status: DC | PRN
  Administered 2020-03-09: 4 mg via INTRAVENOUS

## 2020-03-09 MED ORDER — FENTANYL CITRATE (PF) 100 MCG/2ML IJ SOLN: 25.0000 ug | INTRAMUSCULAR | Status: DC | PRN

## 2020-03-09 MED ORDER — GENTAMICIN SULFATE 40 MG/ML IJ SOLN
250.0000 mg | INTRAVENOUS | Status: AC
  Administered 2020-03-09: 250 mg via INTRAVENOUS
  Filled 2020-03-09: qty 6.25

## 2020-03-09 MED ORDER — HYDROCODONE-ACETAMINOPHEN 5-325 MG PO TABS: 1.0000 | ORAL_TABLET | ORAL | 0 refills | Status: DC | PRN

## 2020-03-09 MED ORDER — CIPROFLOXACIN IN D5W 400 MG/200ML IV SOLN
400.0000 mg | Freq: Once | INTRAVENOUS | Status: AC
  Administered 2020-03-09: 400 mg via INTRAVENOUS

## 2020-03-09 MED ORDER — IOHEXOL 300 MG/ML  SOLN
INTRAMUSCULAR | Status: DC | PRN
  Administered 2020-03-09: 4 mL

## 2020-03-09 MED ORDER — CIPROFLOXACIN IN D5W 400 MG/200ML IV SOLN
INTRAVENOUS | Status: AC
  Filled 2020-03-09: qty 200

## 2020-03-09 MED ORDER — PROPOFOL 10 MG/ML IV BOLUS
INTRAVENOUS | Status: DC | PRN
  Administered 2020-03-09: 100 mg via INTRAVENOUS
  Administered 2020-03-09: 50 mg via INTRAVENOUS

## 2020-03-09 MED ORDER — ACETAMINOPHEN 325 MG PO TABS
ORAL_TABLET | ORAL | Status: AC
  Filled 2020-03-09: qty 2

## 2020-03-09 MED ORDER — EPHEDRINE SULFATE-NACL 50-0.9 MG/10ML-% IV SOSY
PREFILLED_SYRINGE | INTRAVENOUS | Status: DC | PRN
  Administered 2020-03-09: 10 mg via INTRAVENOUS

## 2020-03-09 MED ORDER — MIDAZOLAM HCL 2 MG/2ML IJ SOLN
INTRAMUSCULAR | Status: DC | PRN
  Administered 2020-03-09: 1 mg via INTRAVENOUS

## 2020-03-09 MED ORDER — DEXAMETHASONE SODIUM PHOSPHATE 10 MG/ML IJ SOLN
INTRAMUSCULAR | Status: DC | PRN
  Administered 2020-03-09: 4 mg via INTRAVENOUS

## 2020-03-09 MED ORDER — ONDANSETRON HCL 4 MG/2ML IJ SOLN: 4.0000 mg | Freq: Once | INTRAMUSCULAR | Status: DC | PRN

## 2020-03-09 SURGICAL SUPPLY — 34 items
APL SKNCLS STERI-STRIP NONHPOA (GAUZE/BANDAGES/DRESSINGS)
BAG DRAIN URO-CYSTO SKYTR STRL (DRAIN) ×2 IMPLANT
BAG DRN RND TRDRP ANRFLXCHMBR (UROLOGICAL SUPPLIES) ×1
BAG DRN UROCATH (DRAIN) ×1
BAG URINE DRAIN 2000ML AR STRL (UROLOGICAL SUPPLIES) ×2 IMPLANT
BASKET STONE 1.7 NGAGE (UROLOGICAL SUPPLIES) IMPLANT
BASKET ZERO TIP NITINOL 2.4FR (BASKET) ×2 IMPLANT
BENZOIN TINCTURE PRP APPL 2/3 (GAUZE/BANDAGES/DRESSINGS) IMPLANT
BSKT STON RTRVL ZERO TP 2.4FR (BASKET) ×1
CATH FOLEY 2WAY SLVR  5CC 16FR (CATHETERS) ×2
CATH FOLEY 2WAY SLVR 5CC 16FR (CATHETERS) ×1 IMPLANT
CATH URET 5FR 28IN OPEN ENDED (CATHETERS) IMPLANT
CLOTH BEACON ORANGE TIMEOUT ST (SAFETY) ×2 IMPLANT
FIBER LASER FLEXIVA 365 (UROLOGICAL SUPPLIES) IMPLANT
GLOVE BIO SURGEON STRL SZ7.5 (GLOVE) ×2 IMPLANT
GOWN STRL REUS W/ TWL XL LVL3 (GOWN DISPOSABLE) ×1 IMPLANT
GOWN STRL REUS W/TWL XL LVL3 (GOWN DISPOSABLE) ×4 IMPLANT
GUIDEWIRE STR DUAL SENSOR (WIRE) ×2 IMPLANT
GUIDEWIRE ZIPWRE .038 STRAIGHT (WIRE) ×4 IMPLANT
HOLDER FOLEY CATH W/STRAP (MISCELLANEOUS) ×2 IMPLANT
INFUSOR MANOMETER BAG 3000ML (MISCELLANEOUS) ×2 IMPLANT
IV NS IRRIG 3000ML ARTHROMATIC (IV SOLUTION) ×4 IMPLANT
KIT TURNOVER CYSTO (KITS) ×2 IMPLANT
MANIFOLD NEPTUNE II (INSTRUMENTS) ×2 IMPLANT
NS IRRIG 500ML POUR BTL (IV SOLUTION) ×2 IMPLANT
PACK CYSTO (CUSTOM PROCEDURE TRAY) ×2 IMPLANT
SHEATH URETERAL 12FRX35CM (MISCELLANEOUS) ×2 IMPLANT
STENT URET 6FRX24 CONTOUR (STENTS) ×2 IMPLANT
STRIP CLOSURE SKIN 1/2X4 (GAUZE/BANDAGES/DRESSINGS) IMPLANT
SYR 10ML LL (SYRINGE) ×2 IMPLANT
TRACTIP FLEXIVA PULS ID 200XHI (Laser) ×2 IMPLANT
TRACTIP FLEXIVA PULSE ID 200 (Laser) ×4
TUBE CONNECTING 12X1/4 (SUCTIONS) ×2 IMPLANT
TUBING UROLOGY SET (TUBING) ×2 IMPLANT

## 2020-03-09 NOTE — Discharge Instructions (Signed)
[facility] °[phone] °Post Ureteroscopy With or Without Stent Instructions ° °Definitions: ° °Ureter: The duct that transports urine from the kidney to the bladder. °Stent:   A plastic hollow tube that is placed into the ureter, from the kidney to the bladder to prevent the ureter from swelling shut. ° °GENERAL INSTRUCTIONS: ° °Despite the fact that no skin incisions were used, the area around the ureter and bladder is raw and irritated. The stent is a foreign body which will further irritate the bladder wall. This irritation is manifested by increased frequency of urination, both day and night, and by an increase in the urge to urinate. In some, the urge to urinate is present almost always. Sometimes the urge is strong enough that you may not be able to stop yourself from urinating. The only real cure is to remove the stent and then give time for the bladder wall to heal which can't be done until the danger of the ureter swelling shut has passed, which varies. ° °You may see some blood in your urine while the stent is in place and a few days afterwards. Do not be alarmed, even if the urine was clear for a while. Get off your feet and drink lots of fluids until clearing occurs. If you start to pass clots or don't improve, call us. ° °DIET: °You may return to your normal diet immediately. Because of the raw surface of your bladder, alcohol, spicy foods, acid type foods and drinks with caffeine may cause irritation or frequency and should be used in moderation. To keep your urine flowing freely and to avoid constipation, drink plenty of fluids during the day ( 8-10 glasses ). °Tip: Avoid cranberry juice because it is very acidic. ° °ACTIVITY: °Your physical activity doesn't need to be restricted. However, if you are very active, you may see some blood in your urine. We suggest that you reduce your activity under these circumstances until the bleeding has stopped. ° °BOWELS: °It is important to  keep your bowels regular during the postoperative period. Straining with bowel movements can cause bleeding. A bowel movement every other day is reasonable. Use a mild laxative if needed, such as Milk of Magnesia 2-3 tablespoons, or 2 Dulcolax tablets. Call if you continue to have problems. If you have been taking narcotics for pain, before, during or after your surgery, you may be constipated. Take a laxative if necessary. ° ° °MEDICATION: °You should resume your pre-surgery medications unless told not to. In addition you will often be given an antibiotic to prevent infection. These should be taken as prescribed until the bottles are finished unless you are having an unusual reaction to one of the drugs. ° °PROBLEMS YOU SHOULD REPORT TO US: °Fevers over 100.5 Fahrenheit. °Heavy bleeding, or clots ( See above notes about blood in urine ). °Inability to urinate. °Drug reactions ( hives, rash, nausea, vomiting, diarrhea ). °Severe burning or pain with urination that is not improving. ° °FOLLOW-UP: °You will need a follow-up appointment to monitor your progress. Call for this appointment at the number listed above. Usually the first appointment will be about three to fourteen days after your surgery. ° ° ° °  ° ° ° °Post Anesthesia Home Care Instructions ° °Activity: °Get plenty of rest for the remainder of the day. A responsible individual must stay with you for 24 hours following the procedure.  °For the next 24 hours, DO NOT: °-Drive a car °-Operate machinery °-Drink alcoholic beverages °-Take any medication   unless instructed by your physician °-Make any legal decisions or sign important papers. ° °Meals: °Start with liquid foods such as gelatin or soup. Progress to regular foods as tolerated. Avoid greasy, spicy, heavy foods. If nausea and/or vomiting occur, drink only clear liquids until the nausea and/or vomiting subsides. Call your physician if vomiting continues. ° °Special Instructions/Symptoms: °Your throat  may feel dry or sore from the anesthesia or the breathing tube placed in your throat during surgery. If this causes discomfort, gargle with warm salt water. The discomfort should disappear within 24 hours. ° °

## 2020-03-09 NOTE — Anesthesia Preprocedure Evaluation (Addendum)
Anesthesia Evaluation  Patient identified by MRN, date of birth, ID band Patient awake    Reviewed: Allergy & Precautions, NPO status , Patient's Chart, lab work & pertinent test results  Airway Mallampati: I  TM Distance: >3 FB Neck ROM: Full    Dental no notable dental hx. (+) Teeth Intact, Dental Advisory Given   Pulmonary  COVID 02/08/20, currently asymptomatic   Pulmonary exam normal breath sounds clear to auscultation       Cardiovascular hypertension, Pt. 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), Residual Symptoms negative psych ROS   GI/Hepatic negative GI ROS, Neg liver ROS,   Endo/Other  negative endocrine ROS  Renal/GU Renal diseaseRight ureteral and renal stone  negative genitourinary   Musculoskeletal negative musculoskeletal ROS (+)   Abdominal   Peds  Hematology  (+) Blood dyscrasia, anemia ,   Anesthesia Other Findings Ambulates w/ cane  Reproductive/Obstetrics negative OB ROS                           Anesthesia Physical Anesthesia Plan  ASA: II  Anesthesia Plan: General   Post-op Pain Management:    Induction: Intravenous  PONV Risk Score and Plan: 4 or greater and Ondansetron, Dexamethasone, Midazolam and Treatment may vary due to age or medical condition  Airway Management Planned: LMA  Additional Equipment: None  Intra-op Plan:   Post-operative Plan: Extubation in OR  Informed Consent: I have reviewed the patients History and Physical, chart, labs and discussed the procedure including the risks, benefits and alternatives for the proposed anesthesia with the patient or authorized representative who has indicated his/her understanding and acceptance.     Dental advisory given, Interpreter used for interveiw and Consent reviewed with POA  Plan  Discussed with: CRNA  Anesthesia Plan Comments:        Anesthesia Quick Evaluation

## 2020-03-09 NOTE — H&P (Signed)
Urology Preoperative H&P   Chief Complaint: Right ureteral stone  History of Present Illness: Barbara Strickland is a 59 y.o. female status post right ureteral stent placement on 01/02/2021 due to an obstructing 4 mm right distal ureteral stone.  She was also found to have a staghorn calculus involving the right kidney on CT stone study.  She is here today for definitive management of her ureteral calculus and evaluation of her large right renal stone burden.  She is urinating without difficulty and denies interval fever/chills.  She does report intermittent episodes of dysuria and hematuria, likely related to her stent.  Urine culture from 03/07/2020 grew pansensitive E. coli and she has been on cephalexin since that time.  She was scheduled for surgery last week, but developed COVID-19, which prompted delaying her operation.   Past Medical History:  Diagnosis Date  . COVID 02/08/2020   asymptomatic  . 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 Hemorrhagic stroke ([place]) 2009  . History of kidney stones   . Hx of adenomatous colonic polyps 08/05/2017  . Hypertension   . Illiterate    pt cannot read/write english, daughter chien h signs consents  . 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 Microcytic anemia     Past Surgical History:  Procedure Laterality Date  . CYSTOSCOPY W/ URETERAL STENT PLACEMENT Right 01/03/2020   Procedure: CYSTOSCOPY WITH RETROGRADE PYELOGRAM/URETERAL STENT PLACEMENT;  Surgeon: Ceasar Mons, MD;  Location: [facility];  Service: Urology;  Laterality: Right;    Allergies: No Known Allergies  Family History  Problem Relation Age of Onset  . Hypertension Neg Hx   . Hyperlipidemia Neg Hx   . Colon cancer Neg Hx   . Colon polyps Neg Hx   . Esophageal cancer Neg Hx   . Rectal cancer Neg Hx   . Stomach cancer Neg Hx     Social History:  reports that she has never smoked. She has never used smokeless tobacco. She reports that she does not drink alcohol and does not use  drugs.  ROS: A complete review of systems was performed.  All systems are negative except for pertinent findings as noted.  Physical Exam:  Vital signs in last 24 hours: Temp:  [97.7 F (36.5 C)] 97.7 F (36.5 C) (02/02 1029) Pulse Rate:  [64] 64 (02/02 1029) Resp:  [14] 14 (02/02 1029) BP: (146)/(91) 146/91 (02/02 1029) SpO2:  [100 %] 100 % (02/02 1029) Weight:  [58.6 kg] 58.6 kg (02/02 1029) Constitutional:  Alert and oriented, No acute distress Cardiovascular: Regular rate and rhythm, No JVD Respiratory: Normal respiratory effort, Lungs clear bilaterally GI: Abdomen is soft, nontender, nondistended, no abdominal masses GU: No CVA tenderness Lymphatic: No lymphadenopathy Neurologic: Grossly intact, no focal deficits Psychiatric: Normal mood and affect  Laboratory Data:  No results for input(s): WBC, HGB, HCT, PLT in the last 72 hours.  Recent Labs    03/07/20 1140  NA 143  K 4.0  CL 105  GLUCOSE 87  BUN 18  CALCIUM 9.1  CREATININE 1.08*     No results found for this or any previous visit (from the past 24 hour(s)). No results found for this or any previous visit (from the past 240 hour(s)).  Renal Function: Recent Labs    03/07/20 1140  CREATININE 1.08*   Estimated Creatinine Clearance: 42.1 mL/min (A) (by C-G formula based on SCr of 1.08 mg/dL (H)).  Radiologic Imaging: CLINICAL DATA:  Flank pain.  Fever.  Evaluate for  kidney stone.  EXAM: CT ABDOMEN AND PELVIS WITHOUT CONTRAST  TECHNIQUE: Multidetector CT imaging of the abdomen and pelvis was performed following the standard protocol without IV contrast.  COMPARISON:  None.  FINDINGS: Lower chest: No acute abnormality.  Hepatobiliary: No focal liver abnormality identified. Small stones noted within the dependent portion of the gallbladder measuring up to 4 mm. No signs of gallbladder wall inflammation or bile duct dilatation.  Pancreas: Unremarkable. No pancreatic ductal dilatation  or surrounding inflammatory changes.  Spleen: Normal in size without focal abnormality.  Adrenals/Urinary Tract: There are multiple calcifications within the posterior and lateral upper pole calices of the right kidney. Central right upper pole stone measures 2.4 cm. Right-sided hydronephrosis and hydroureter noted. At the right UVJ there is a small stone measuring 4 mm, image 74/2. No left renal calculi or left-sided hydronephrosis or hydroureter. The left renal collecting system appears duplicated.  Stomach/Bowel: Stomach is within normal limits. Appendix appears normal. No evidence of bowel wall thickening, distention, or inflammatory changes.  Vascular/Lymphatic: Normal appearance of the abdominal aorta. No aneurysm. No abdominopelvic adenopathy identified.  Reproductive: Uterus and bilateral adnexa are unremarkable.  Other: No free fluid or fluid collections  Musculoskeletal: No acute or significant osseous findings.  IMPRESSION: 1. Right-sided hydronephrosis and hydroureter secondary to 4 mm right UVJ calculus. 2. Multiple large upper pole right renal calculi. 3. Gallstones.   Electronically Signed   By: Kerby Moors M.D.   On: 01/03/2020 15:27  I independently reviewed the above imaging studies.  Assessment and Plan Barbara Strickland is a 60 y.o. female with an obstructing 4 mm right ureteral calculus as well as a right staghorn calculus, status post ureteral stent placement on 01/02/2021.  Her right staghorn calculus does not appear to be within the collecting system on cross-sectional imaging.   The risks, benefits and alternatives of cystoscopy with right ureteroscopy, laser lithotripsy and ureteral stent placement was discussed the patient.  Risks included, but are not limited to: bleeding, urinary tract infection, ureteral injury/avulsion, ureteral stricture formation, retained stone fragments, the possibility that multiple surgeries may be required to treat  the stone(s), MI, stroke, PE and the inherent risks of general anesthesia.  The patient voices understanding and wishes to proceed.      Ellison Hughs, MD 03/09/2020, 11:32 AM  [facility] Pager: [phone]

## 2020-03-09 NOTE — Anesthesia Postprocedure Evaluation (Signed)
Anesthesia Post Note  Patient: Barbara Strickland  Procedure(s) Performed: CYSTOSCOPY/RETROGRADE/URETEROSCOPY/HOLMIUM LASER/STENT REPLACEMENT (Right Renal)     Patient location during evaluation: PACU Anesthesia Type: General Level of consciousness: awake and alert Pain management: pain level controlled Vital Signs Assessment: post-procedure vital signs reviewed and stable Respiratory status: spontaneous breathing, nonlabored ventilation and respiratory function stable Cardiovascular status: blood pressure returned to baseline and stable Postop Assessment: no apparent nausea or vomiting Anesthetic complications: no   No complications documented.  Last Vitals:  Vitals:   03/09/20 1415 03/09/20 1500  BP: (!) 158/91 135/82  Pulse: 63 78  Resp: 17 16  Temp:    SpO2: 99% 97%    Last Pain:  Vitals:   03/09/20 1500  TempSrc:   PainSc: 0-No pain                 Lidia Collum

## 2020-03-09 NOTE — Transfer of Care (Signed)
Immediate Anesthesia Transfer of Care Note  Patient: Barbara Strickland  Procedure(s) Performed: Procedure(s) (LRB): CYSTOSCOPY/RETROGRADE/URETEROSCOPY/HOLMIUM LASER/STENT REPLACEMENT (Right)  Patient Location: PACU  Anesthesia Type: General  Level of Consciousness: awake, oriented, sedated and patient cooperative  Airway & Oxygen Therapy: Patient Spontanous Breathing and Patient connected to face mask oxygen  Post-op Assessment: Report given to PACU RN and Post -op Vital signs reviewed and stable  Post vital signs: Reviewed and stable  Complications: No apparent anesthesia complications  Last Vitals:  Vitals Value Taken Time  BP 181/98 03/09/20 1327  Temp    Pulse 59 03/09/20 1328  Resp 17 03/09/20 1328  SpO2 98 % 03/09/20 1328  Vitals shown include unvalidated device data.  Last Pain:  Vitals:   03/09/20 1029  TempSrc: Oral         Complications: No complications documented.

## 2020-03-09 NOTE — Anesthesia Procedure Notes (Signed)
Procedure Name: LMA Insertion Date/Time: 03/09/2020 12:07 PM Performed by: Suan Halter, CRNA Pre-anesthesia Checklist: Patient identified, Emergency Drugs available, Suction available and Patient being monitored Patient Re-evaluated:Patient Re-evaluated prior to induction Oxygen Delivery Method: Circle system utilized Preoxygenation: Pre-oxygenation with 100% oxygen Induction Type: IV induction Ventilation: Mask ventilation without difficulty LMA: LMA inserted LMA Size: 4.0 Number of attempts: 1 Airway Equipment and Method: Bite block Placement Confirmation: positive ETCO2 Tube secured with: Tape Dental Injury: Teeth and Oropharynx as per pre-operative assessment

## 2020-03-09 NOTE — Op Note (Signed)
Operative Note  Preoperative diagnosis:  1.  4 mm right distal ureteral calculus 2.  Right staghorn calculus  Postoperative diagnosis: Same  Procedure(s): 1.  Cystoscopy with right ureteroscopy, holmium laser lithotripsy and right JJ stent placement/exchange   Surgeon: Ellison Hughs, MD  Assistants:  None  Anesthesia:  General  Complications:  None  EBL: Less than 5 mL  Specimens: 1.  Previously placed right IJ stent was removed intact, inspected and discarded 2.  Right distal ureteral stone fragments  Drains/Catheters: 1.  Right 6 French, 24 cm JJ stent without tether  Intraoperative findings:   1. Obstructing 4 mm right distal ureteral calculus 2. Right upper pole staghorn calculus   Indication:  Barbara Strickland is a 60 y.o. female with an obstructing 4 mm right distal ureteral calculus that required ureteral stent placement on 01/03/2020 along with a right staghorn calculus.  She is here today to treat her right ureteral stone and assess her right staghorn calculus.  She has been consented for the above procedures, voices understanding wishes to proceed.  Description of procedure:  After informed consent was obtained, the patient was brought to the operating room and general LMA anesthesia was administered. The patient was then placed in the dorsolithotomy position and prepped and draped in the usual sterile fashion. A timeout was performed. A 23 French rigid cystoscope was then inserted into the urethral meatus and advanced into the bladder under direct vision. A complete bladder survey revealed no intravesical pathology.  Her previously placed right JJ stent was removed intact, inspected and discarded.  A zip wire was then advanced up the right ureter to the right renal pelvis, under fluoroscopic guidance.  A semirigid ureteroscope was then navigated into the distal aspects of the right ureter where her obstructing stone was identified.  A 200 m holmium laser was then  used to fracture the stone into numerous smaller pieces.  A 0 tip basket was then used to extract all stone fragments from the lumen of the right ureter.    The semirigid ureteroscope was then removed.  A sensor wire was then placed up the right ureter to the right renal pelvis, or fluoroscopic guidance.  A ureteral access sheath was then advanced over the sensor wire and into position within the proximal aspects of the right ureter, confirming placement via fluoroscopy.  A flexible ureteroscope was then advanced through the lumen of the ureteral access sheath and into the right renal pelvis where her staghorn calculus was identified in the upper pole.  The 200 m holmium laser was then used to fracture approximately 50% of the stone burden within the upper pole.  Due to the very large stone burden, she will require a staged procedure.  The flexible ureteroscope and ureteral access sheath were then removed under direct vision, revealing no evidence of ureteral trauma or significant stone burden within the lumen of the right ureter.  A 6 French, 24 cm JJ stent was then advanced over the wire and into good position within the right collecting system, confirming placement via fluoroscopy.  A 16 French Foley catheter was then placed to help with maximal drainage of her urinary tract.  She tolerated the procedure well and was transferred to the postanesthesia in stable condition.  Plan: Remove Foley catheter at 7 AM on 03/11/2020.  I will the second stage ureteroscopy to address her right upper pole staghorn calculus next week.  She will be discharged home on a course of keflex.

## 2020-03-10 ENCOUNTER — Encounter (HOSPITAL_BASED_OUTPATIENT_CLINIC_OR_DEPARTMENT_OTHER): Payer: Self-pay | Admitting: Urology

## 2020-03-11 ENCOUNTER — Other Ambulatory Visit: Payer: Self-pay | Admitting: Urology

## 2020-03-18 ENCOUNTER — Encounter (HOSPITAL_BASED_OUTPATIENT_CLINIC_OR_DEPARTMENT_OTHER): Payer: Self-pay | Admitting: Urology

## 2020-03-18 ENCOUNTER — Other Ambulatory Visit: Payer: Self-pay

## 2020-03-18 NOTE — Progress Notes (Signed)
Spoke w/ via phone for pre-op interview---pt daughter Lurline Hare h states no changes in medical history since last phone call Lab needs dos----  I stat 8             COVID test ------ positive covid test 02-08-2020 results in epic Arrive at -------1145 am 03-23-2020 NPO after MN NO Solid Food.   Water  from MN until---1045 am then npo Medications to take morning of surgery -----atorvastatin, amlodipine Diabetic medication -----n/a Patient Special Instructions -----none Pre-Op special Istructions -----none Patient verbalized understanding of instructions that were given at this phone interview. 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  Chart to [place] [facility] for review Addendum: patient meets [facility] guidelines per Janett Billow zanetto pa per 02-03-2021 note  PCP: dr Kittie Plater sagardia Cassell Clement 11-03-2019 epic Cardiologist :none Chest x-ray :none EKG :01-04-2020 epic Echo :none Stress test:none Cardiac Cath : none Activity level: walks in home with cane, no sob or chest pain with ambulation, can climb few steps without difficulty per daughter, does not do housework Sleep Study/ CPAP :none Fasting Blood Sugar :      / Checks Blood Sugar -- times a day:  n/a Blood Thinner/ Instructions /Last Dose:n/a ASA / Instructions/ Last Dose : na/  Pt daughter chien h to interpret day of surgery, release to be signed

## 2020-03-23 ENCOUNTER — Ambulatory Visit (HOSPITAL_BASED_OUTPATIENT_CLINIC_OR_DEPARTMENT_OTHER): Payer: Medicaid Other | Admitting: Anesthesiology

## 2020-03-23 ENCOUNTER — Encounter (HOSPITAL_BASED_OUTPATIENT_CLINIC_OR_DEPARTMENT_OTHER)
Admission: RE | Disposition: A | Discharge status: Home / Self Care | Payer: Self-pay | Source: Home / Self Care | Attending: Urology

## 2020-03-23 ENCOUNTER — Encounter (HOSPITAL_BASED_OUTPATIENT_CLINIC_OR_DEPARTMENT_OTHER): Payer: Self-pay | Admitting: Urology

## 2020-03-23 ENCOUNTER — Ambulatory Visit (HOSPITAL_BASED_OUTPATIENT_CLINIC_OR_DEPARTMENT_OTHER)
Admission: RE | Admit: 2020-03-23 | Discharge: 2020-03-23 | Disposition: A | Discharge status: Home / Self Care | Payer: Medicaid Other | Attending: Urology | Admitting: Urology

## 2020-03-23 DIAGNOSIS — Z8616 Personal history of COVID-19: Secondary | ICD-10-CM | POA: Diagnosis not present

## 2020-03-23 DIAGNOSIS — Z86010 Personal history of colonic polyps: Secondary | ICD-10-CM | POA: Diagnosis not present

## 2020-03-23 DIAGNOSIS — Z87442 Personal history of urinary calculi: Secondary | ICD-10-CM | POA: Diagnosis not present

## 2020-03-23 DIAGNOSIS — N200 Calculus of kidney: Secondary | ICD-10-CM | POA: Insufficient documentation

## 2020-03-23 DIAGNOSIS — Z8673 Personal history of transient ischemic attack (TIA), and cerebral infarction without residual deficits: Secondary | ICD-10-CM | POA: Insufficient documentation

## 2020-03-23 HISTORY — PX: CYSTOSCOPY/URETEROSCOPY/HOLMIUM LASER/STENT PLACEMENT: SHX6546

## 2020-03-23 LAB — POCT I-STAT, CHEM 8
BUN: 15 mg/dL (ref 8–23)
Calcium, Ion: 1.13 mmol/L — ABNORMAL LOW (ref 1.15–1.40)
Chloride: 107 mmol/L (ref 98–111)
Creatinine, Ser: 0.8 mg/dL (ref 0.44–1.00)
Glucose, Bld: 89 mg/dL (ref 70–99)
HCT: 38 % (ref 36.0–46.0)
Hemoglobin: 12.9 g/dL (ref 12.0–15.0)
Potassium: 3.7 mmol/L (ref 3.5–5.1)
Sodium: 145 mmol/L (ref 135–145)
TCO2: 25 mmol/L (ref 22–32)

## 2020-03-23 SURGERY — CYSTOSCOPY/URETEROSCOPY/HOLMIUM LASER/STENT PLACEMENT
Anesthesia: General | Site: Ureter | Laterality: Right

## 2020-03-23 MED ORDER — CIPROFLOXACIN IN D5W 400 MG/200ML IV SOLN
INTRAVENOUS | Status: AC
  Filled 2020-03-23: qty 200

## 2020-03-23 MED ORDER — OXYCODONE HCL 5 MG PO TABS: 5.0000 mg | ORAL_TABLET | Freq: Once | ORAL | Status: DC | PRN

## 2020-03-23 MED ORDER — CIPROFLOXACIN HCL 500 MG PO TABS: 500.0000 mg | ORAL_TABLET | Freq: Two times a day (BID) | ORAL | 0 refills | Status: AC

## 2020-03-23 MED ORDER — FENTANYL CITRATE (PF) 100 MCG/2ML IJ SOLN
INTRAMUSCULAR | Status: AC
  Filled 2020-03-23: qty 2

## 2020-03-23 MED ORDER — ONDANSETRON HCL 4 MG/2ML IJ SOLN: 4.0000 mg | Freq: Once | INTRAMUSCULAR | Status: DC | PRN

## 2020-03-23 MED ORDER — DEXAMETHASONE SODIUM PHOSPHATE 10 MG/ML IJ SOLN
INTRAMUSCULAR | Status: DC | PRN
  Administered 2020-03-23: 5 mg via INTRAVENOUS

## 2020-03-23 MED ORDER — DEXAMETHASONE SODIUM PHOSPHATE 10 MG/ML IJ SOLN
INTRAMUSCULAR | Status: AC
  Filled 2020-03-23: qty 1

## 2020-03-23 MED ORDER — KETOROLAC TROMETHAMINE 30 MG/ML IJ SOLN
INTRAMUSCULAR | Status: AC
  Filled 2020-03-23: qty 1

## 2020-03-23 MED ORDER — LIDOCAINE 2% (20 MG/ML) 5 ML SYRINGE
INTRAMUSCULAR | Status: DC | PRN
  Administered 2020-03-23: 100 mg via INTRAVENOUS

## 2020-03-23 MED ORDER — PROPOFOL 10 MG/ML IV BOLUS
INTRAVENOUS | Status: DC | PRN
  Administered 2020-03-23: 120 mg via INTRAVENOUS

## 2020-03-23 MED ORDER — PROPOFOL 10 MG/ML IV BOLUS
INTRAVENOUS | Status: AC
  Filled 2020-03-23: qty 20

## 2020-03-23 MED ORDER — IOHEXOL 300 MG/ML  SOLN
INTRAMUSCULAR | Status: DC | PRN
  Administered 2020-03-23: .01 mL

## 2020-03-23 MED ORDER — FENTANYL CITRATE (PF) 100 MCG/2ML IJ SOLN
25.0000 ug | INTRAMUSCULAR | Status: DC | PRN
  Administered 2020-03-23: 25 ug via INTRAVENOUS

## 2020-03-23 MED ORDER — SODIUM CHLORIDE 0.9 % IR SOLN
Status: DC | PRN
  Administered 2020-03-23: 6000 mL via INTRAVESICAL

## 2020-03-23 MED ORDER — CIPROFLOXACIN IN D5W 400 MG/200ML IV SOLN
INTRAVENOUS | Status: DC | PRN
  Administered 2020-03-23: 400 mg via INTRAVENOUS

## 2020-03-23 MED ORDER — ONDANSETRON HCL 4 MG/2ML IJ SOLN
INTRAMUSCULAR | Status: DC | PRN
  Administered 2020-03-23: 4 mg via INTRAVENOUS

## 2020-03-23 MED ORDER — EPHEDRINE SULFATE-NACL 50-0.9 MG/10ML-% IV SOSY
PREFILLED_SYRINGE | INTRAVENOUS | Status: DC | PRN
  Administered 2020-03-23: 10 mg via INTRAVENOUS

## 2020-03-23 MED ORDER — LIDOCAINE HCL (PF) 2 % IJ SOLN
INTRAMUSCULAR | Status: AC
  Filled 2020-03-23: qty 5

## 2020-03-23 MED ORDER — FENTANYL CITRATE (PF) 100 MCG/2ML IJ SOLN
INTRAMUSCULAR | Status: DC | PRN
  Administered 2020-03-23 (×4): 25 ug via INTRAVENOUS

## 2020-03-23 MED ORDER — OXYCODONE HCL 5 MG/5ML PO SOLN: 5.0000 mg | Freq: Once | ORAL | Status: DC | PRN

## 2020-03-23 MED ORDER — MIDAZOLAM HCL 2 MG/2ML IJ SOLN
INTRAMUSCULAR | Status: AC
  Filled 2020-03-23: qty 2

## 2020-03-23 MED ORDER — ONDANSETRON HCL 4 MG/2ML IJ SOLN
INTRAMUSCULAR | Status: AC
  Filled 2020-03-23: qty 2

## 2020-03-23 MED ORDER — KETOROLAC TROMETHAMINE 30 MG/ML IJ SOLN
INTRAMUSCULAR | Status: DC | PRN
  Administered 2020-03-23: 30 mg via INTRAVENOUS

## 2020-03-23 MED ORDER — LACTATED RINGERS IV SOLN: INTRAVENOUS | Status: DC

## 2020-03-23 MED ORDER — MIDAZOLAM HCL 2 MG/2ML IJ SOLN
INTRAMUSCULAR | Status: DC | PRN
  Administered 2020-03-23: 1 mg via INTRAVENOUS

## 2020-03-23 SURGICAL SUPPLY — 27 items
APL SKNCLS STERI-STRIP NONHPOA (GAUZE/BANDAGES/DRESSINGS)
BAG DRAIN URO-CYSTO SKYTR STRL (DRAIN) ×2 IMPLANT
BAG DRN UROCATH (DRAIN) ×1
BASKET STONE 1.7 NGAGE (UROLOGICAL SUPPLIES) IMPLANT
BASKET ZERO TIP NITINOL 2.4FR (BASKET) ×2 IMPLANT
BENZOIN TINCTURE PRP APPL 2/3 (GAUZE/BANDAGES/DRESSINGS) IMPLANT
BSKT STON RTRVL ZERO TP 2.4FR (BASKET) ×1
CATH URET 5FR 28IN OPEN ENDED (CATHETERS) IMPLANT
CLOTH BEACON ORANGE TIMEOUT ST (SAFETY) ×2 IMPLANT
FIBER LASER FLEXIVA 365 (UROLOGICAL SUPPLIES) IMPLANT
GLOVE SURG ENC MOIS LTX SZ7.5 (GLOVE) ×2 IMPLANT
GOWN STRL REUS W/TWL XL LVL3 (GOWN DISPOSABLE) ×2 IMPLANT
GUIDEWIRE STR DUAL SENSOR (WIRE) IMPLANT
GUIDEWIRE ZIPWRE .038 STRAIGHT (WIRE) ×2 IMPLANT
IV NS IRRIG 3000ML ARTHROMATIC (IV SOLUTION) ×4 IMPLANT
KIT TURNOVER CYSTO (KITS) ×2 IMPLANT
MANIFOLD NEPTUNE II (INSTRUMENTS) ×2 IMPLANT
NS IRRIG 500ML POUR BTL (IV SOLUTION) ×2 IMPLANT
PACK CYSTO (CUSTOM PROCEDURE TRAY) ×2 IMPLANT
SHEATH URETERAL 12FRX35CM (MISCELLANEOUS) ×2 IMPLANT
STENT URET 6FRX24 CONTOUR (STENTS) ×2 IMPLANT
STRIP CLOSURE SKIN 1/2X4 (GAUZE/BANDAGES/DRESSINGS) IMPLANT
SYR 10ML LL (SYRINGE) ×2 IMPLANT
TRACTIP FLEXIVA PULS ID 200XHI (Laser) ×1 IMPLANT
TRACTIP FLEXIVA PULSE ID 200 (Laser) ×2
TUBE CONNECTING 12X1/4 (SUCTIONS) IMPLANT
TUBING UROLOGY SET (TUBING) IMPLANT

## 2020-03-23 NOTE — Anesthesia Postprocedure Evaluation (Signed)
Anesthesia Post Note  Patient: Barbara Strickland  Procedure(s) Performed: CYSTOSCOPY/STENT REMOVAL, URETEROSCOPY/HOLMIUM LASER/STENT PLACEMENT (Right Ureter)     Patient location during evaluation: PACU Anesthesia Type: General Level of consciousness: awake and alert Pain management: pain level controlled Vital Signs Assessment: post-procedure vital signs reviewed and stable Respiratory status: spontaneous breathing, nonlabored ventilation, respiratory function stable and patient connected to nasal cannula oxygen Cardiovascular status: blood pressure returned to baseline and stable Postop Assessment: no apparent nausea or vomiting Anesthetic complications: no   No complications documented.  Last Vitals:  Vitals:   03/23/20 1615 03/23/20 1707  BP: (!) 146/98 127/85  Pulse: 75 72  Resp: (!) 23 18  Temp:    SpO2: 100% 98%    Last Pain:  Vitals:   03/23/20 1707  TempSrc:   PainSc: 0-No pain                 Tiajuana Amass

## 2020-03-23 NOTE — Anesthesia Procedure Notes (Signed)
Procedure Name: LMA Insertion Date/Time: 03/23/2020 1:38 PM Performed by: Suan Halter, CRNA Pre-anesthesia Checklist: Patient identified, Emergency Drugs available, Suction available and Patient being monitored Patient Re-evaluated:Patient Re-evaluated prior to induction Oxygen Delivery Method: Circle system utilized Preoxygenation: Pre-oxygenation with 100% oxygen Induction Type: IV induction Ventilation: Mask ventilation without difficulty LMA: LMA inserted LMA Size: 4.0 Number of attempts: 1 Airway Equipment and Method: Bite block Placement Confirmation: positive ETCO2 Tube secured with: Tape Dental Injury: Teeth and Oropharynx as per pre-operative assessment

## 2020-03-23 NOTE — Op Note (Signed)
Operative Note  Preoperative diagnosis:  1.  Right staghorn calculus  Postoperative diagnosis: 1.  Right staghorn calculus  Procedure(s): 1.  Cystoscopy with right ureteroscopy, holmium laser lithotripsy and right JJ stent placement  Surgeon: Ellison Hughs, MD  Assistants:  None  Anesthesia:  General  Complications:  None  EBL: Less than 5 mL  Specimens: 1.  Previously placed left JJ stent was removed intact, inspected and discarded  Drains/Catheters: 1.  6 French, 24 cm JJ stent without tether  Intraoperative findings:   1. Right staghorn calculus in the upper pole with excellent fragmentation following laser lithotripsy  Indication:  Barbara Strickland is a 60 y.o. female with a history of a right-sided staghorn calculus.  She is here today for staged ureteroscopy to address residual stone burden following ureteroscopy on 03/09/2020.  She has been consented for the above procedures, voices understanding and wishes to proceed. Description of procedure:  After informed consent was obtained, the patient was brought to the operating room and general LMA anesthesia was administered. The patient was then placed in the dorsolithotomy position and prepped and draped in the usual sterile fashion. A timeout was performed. A 23 French rigid cystoscope was then inserted into the urethral meatus and advanced into the bladder under direct vision. A complete bladder survey revealed no intravesical pathology.  Her previously placed right JJ stent was grasped at its distal curl and retracted to the urethral meatus.  A Glidewire was then advanced through the lumen of the stent and up to the right renal pelvis, or fluoroscopic guidance.  The previously placed stent was then removed intact, inspected and discarded.  An additional sensor wire was then advanced up the right ureter to the right renal pelvis, or fluoroscopic guidance.  A medium access sheath was then advanced over the sensor wire and up to  the proximal aspects of the right ureter.  A flexible ureteroscope was then advanced through the lumen of the access sheath and into the right renal pelvis.  Her large stone burden was identified in the upper pole.  A 200 m holmium laser was then used to fracture her numerous large stones into passable fragments.  Once her stones were adequately dusted, the flexible ureteroscope was then removed, along with the ureteral access sheath, under direct vision.  There was no evidence of ureteral trauma while removing from the ureteral access sheath.  A new 6 [place], 24 cm JJ stent was then advanced over the wire and into good position within the right collecting system, confirming placement via fluoroscopy.  The patient's bladder was drained.  She tolerated the procedure well and was transferred to the postanesthesia in stable condition.  Plan: Follow-up in 2 weeks with KUB and possible stent removal

## 2020-03-23 NOTE — Anesthesia Preprocedure Evaluation (Signed)
Anesthesia Evaluation  Patient identified by MRN, date of birth, ID band Patient awake    Reviewed: Allergy & Precautions, H&P , NPO status , Patient's Chart, lab work & pertinent test results  Airway Mallampati: II  TM Distance: >3 FB Neck ROM: Full    Dental no notable dental hx.    Pulmonary neg pulmonary ROS,    Pulmonary exam normal breath sounds clear to auscultation       Cardiovascular hypertension, Pt. on medications Normal cardiovascular exam Rhythm:Regular Rate:Normal     Neuro/Psych CVA, Residual Symptoms negative psych ROS   GI/Hepatic negative GI ROS, Neg liver ROS,   Endo/Other  negative endocrine ROS  Renal/GU negative Renal ROS  negative genitourinary   Musculoskeletal negative musculoskeletal ROS (+)   Abdominal   Peds negative pediatric ROS (+)  Hematology negative hematology ROS (+)   Anesthesia Other Findings   Reproductive/Obstetrics negative OB ROS                             Anesthesia Physical Anesthesia Plan  ASA: III  Anesthesia Plan: General   Post-op Pain Management:    Induction: Intravenous  PONV Risk Score and Plan: 3 and Ondansetron, Dexamethasone and Treatment may vary due to age or medical condition  Airway Management Planned: LMA  Additional Equipment:   Intra-op Plan:   Post-operative Plan: Extubation in OR  Informed Consent: I have reviewed the patients History and Physical, chart, labs and discussed the procedure including the risks, benefits and alternatives for the proposed anesthesia with the patient or authorized representative who has indicated his/her understanding and acceptance.     Dental advisory given  Plan Discussed with: CRNA and Surgeon  Anesthesia Plan Comments:         Anesthesia Quick Evaluation

## 2020-03-23 NOTE — Discharge Instructions (Signed)
Ureteral Stent Implantation, Care After This sheet gives you information about how to care for yourself after your procedure. Your health care provider may also give you more specific instructions. If you have problems or questions, contact your health care provider. What can I expect after the procedure? After the procedure, it is common to have:  Nausea.  Mild pain when you urinate. You may feel this pain in your lower back or lower abdomen. The pain should stop within a few minutes after you urinate. This may last for up to 1 week.  A small amount of blood in your urine for several days. Follow these instructions at home: Medicines  Take over-the-counter and prescription medicines only as told by your health care provider.  If you were prescribed an antibiotic medicine, take it as told by your health care provider. Do not stop taking the antibiotic even if you start to feel better.  Do not drive for 24 hours if you were given a sedative during your procedure.  Ask your health care provider if the medicine prescribed to you requires you to avoid driving or using heavy machinery. Activity  Rest as told by your health care provider.  Avoid sitting for a long time without moving. Get up to take short walks every 1-2 hours. This is important to improve blood flow and breathing. Ask for help if you feel weak or unsteady.  Return to your normal activities as told by your health care provider. Ask your health care provider what activities are safe for you. General instructions  Watch for any blood in your urine. Call your health care provider if the amount of blood in your urine increases.  If you have a catheter: ? Follow instructions from your health care provider about taking care of your catheter and collection bag. ? Do not take baths, swim, or use a hot tub until your health care provider approves. Ask your health care provider if you may take showers. You may only be allowed to  take sponge baths.  Drink enough fluid to keep your urine pale yellow.  Do not use any products that contain nicotine or tobacco, such as cigarettes, e-cigarettes, and chewing tobacco. These can delay healing after surgery. If you need help quitting, ask your health care provider.  Keep all follow-up visits as told by your health care provider. This is important.   Contact a health care provider if:  You have pain that gets worse or does not get better with medicine, especially pain when you urinate.  You have difficulty urinating.  You feel nauseous or you vomit repeatedly during a period of more than 2 days after the procedure. Get help right away if:  Your urine is dark red or has blood clots in it.  You are leaking urine (have incontinence).  The end of the stent comes out of your urethra.  You cannot urinate.  You have sudden, sharp, or severe pain in your abdomen or lower back.  You have a fever.  You have swelling or pain in your legs.  You have difficulty breathing. Summary  After the procedure, it is common to have mild pain when you urinate that goes away within a few minutes after you urinate. This may last for up to 1 week.  Watch for any blood in your urine. Call your health care provider if the amount of blood in your urine increases.  Take over-the-counter and prescription medicines only as told by your health care provider.  Drink enough fluid to keep your urine pale yellow. This information is not intended to replace advice given to you by your health care provider. Make sure you discuss any questions you have with your health care provider. Document Revised: 10/29/2017 Document Reviewed: 10/30/2017 Elsevier Patient Education  2021 [place] CARE INSTRUCTIONS  Activity: Rest for the remainder of the day.  Do not drive or operate equipment today.  You may resume normal activities in one to two days as instructed by your physician.    Meals: Drink plenty of liquids and eat light foods such as gelatin or soup this evening.  You may return to a normal meal plan tomorrow.  Return to Work: You may return to work in one to two days or as instructed by your physician.  Special Instructions / Symptoms: Call your physician if any of these symptoms occur:   -persistent or heavy bleeding  -bleeding which continues after first few urination  -large blood clots that are difficult to pass  -urine stream diminishes or stops completely  -fever equal to or higher than 101 degrees Farenheit.  -cloudy urine with a strong, foul odor  -severe pain  Females should always wipe from front to back after elimination.  You may feel some burning pain when you urinate.  This should disappear with time.

## 2020-03-23 NOTE — Transfer of Care (Signed)
Immediate Anesthesia Transfer of Care Note  Patient: Evalee Naval  Procedure(s) Performed: Procedure(s) (LRB): CYSTOSCOPY/STENT REMOVAL, URETEROSCOPY/HOLMIUM LASER/STENT PLACEMENT (Right)  Patient Location: PACU  Anesthesia Type: General  Level of Consciousness: awake, oriented, sedated and patient cooperative  Airway & Oxygen Therapy: Patient Spontanous Breathing and Patient connected to face mask oxygen  Post-op Assessment: Report given to PACU RN and Post -op Vital signs reviewed and stable  Post vital signs: Reviewed and stable  Complications: No apparent anesthesia complications Last Vitals:  Vitals Value Taken Time  BP 158/87 03/23/20 1530  Temp    Pulse 73 03/23/20 1532  Resp 25 03/23/20 1532  SpO2 100 % 03/23/20 1532  Vitals shown include unvalidated device data.  Last Pain:  Vitals:   03/23/20 1053  TempSrc: Oral  PainSc: 0-No pain      Patients Stated Pain Goal: 5 (14/64/31 4276)  Complications: No complications documented.

## 2020-03-23 NOTE — H&P (Addendum)
Urology Preoperative H&P   Chief Complaint: Right ureteral stone   History of Present Illness: Barbara Strickland is a 60 y.o. female with a staghorn calculus within the right renal pelvis.  She is status post right ureteroscopy on 03/09/2020 to address an obstructing 4 mm calculus that was initially diagnosed on 01/02/2021 and necessitated right ureteral stent placement.  During her ureteroscopy, I partially fractured the staghorn calculus, with the intent of performing a staged procedure.  Due to her multiple medical comorbidities, she is not a good candidate for a PCNL.  Since her last operation, the patient reports intermittent episodes of right-sided flank pain and dysuria, but denies gross hematuria, nausea/vomiting or fever/chills.  She is here today for an additional right ureteroscopy to address her residual stone burden within the right renal pelvis.  The patient is accompanied by her daughter who is acting as her interpreter.   Past Medical History:  Diagnosis Date  . COVID 02/08/2020   asymptomatic  . 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 History of kidney stones   . Hx of adenomatous colonic polyps 08/05/2017  . Hypertension   . Illiterate    pt cannot read/write english, daughter chien h signs consents  . 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 Microcytic anemia     Past Surgical History:  Procedure Laterality Date  . CYSTOSCOPY W/ URETERAL STENT PLACEMENT Right 01/03/2020   Procedure: CYSTOSCOPY WITH RETROGRADE PYELOGRAM/URETERAL STENT PLACEMENT;  Surgeon: Ceasar Mons, MD;  Location: [facility];  Service: Urology;  Laterality: Right;  . CYSTOSCOPY/URETEROSCOPY/HOLMIUM LASER/STENT PLACEMENT Right 03/09/2020   Procedure: CYSTOSCOPY/RETROGRADE/URETEROSCOPY/HOLMIUM LASER/STENT REPLACEMENT;  Surgeon: Ceasar Mons, MD;  Location: [facility];  Service: Urology;  Laterality: Right;    Allergies: No Known Allergies  Family  History  Problem Relation Age of Onset  . Hypertension Neg Hx   . Hyperlipidemia Neg Hx   . Colon cancer Neg Hx   . Colon polyps Neg Hx   . Esophageal cancer Neg Hx   . Rectal cancer Neg Hx   . Stomach cancer Neg Hx     Social History:  reports that she has never smoked. She has never used smokeless tobacco. She reports that she does not drink alcohol and does not use drugs.  ROS: A complete review of systems was performed.  All systems are negative except for pertinent findings as noted.  Physical Exam:  Vital signs in last 24 hours: Temp:  [98 F (36.7 C)] 98 F (36.7 C) (02/16 1053) Pulse Rate:  [76] 76 (02/16 1053) Resp:  [15] 15 (02/16 1053) BP: (146)/(87) 146/87 (02/16 1053) SpO2:  [100 %] 100 % (02/16 1053) Weight:  [57.2 kg] 57.2 kg (02/16 1053) Constitutional:  Alert and oriented, No acute distress Cardiovascular: Regular rate and rhythm, No JVD Respiratory: Normal respiratory effort, Lungs clear bilaterally GI: Abdomen is soft, nontender, nondistended, no abdominal masses GU: No CVA tenderness Lymphatic: No lymphadenopathy Neurologic: Grossly intact, no focal deficits Psychiatric: Normal mood and affect  Laboratory Data:  Recent Labs    03/23/20 1056 03/23/20 1108  HGB 13.3 12.9  HCT 39.0 38.0    Recent Labs    03/23/20 1056 03/23/20 1108  NA 142 145  K 6.5* 3.7  CL 106 107  GLUCOSE 93 89  BUN 20 15  CREATININE 0.90 0.80     Results for orders placed or performed during the hospital encounter of 03/23/20 (from the past 24  hour(s))  I-STAT, chem 8     Status: Abnormal   Collection Time: 03/23/20 10:56 AM  Result Value Ref Range   Sodium 142 135 - 145 mmol/L   Potassium 6.5 (HH) 3.5 - 5.1 mmol/L   Chloride 106 98 - 111 mmol/L   BUN 20 8 - 23 mg/dL   Creatinine, Ser 0.90 0.44 - 1.00 mg/dL   Glucose, Bld 93 70 - 99 mg/dL   Calcium, Ion 1.13 (L) 1.15 - 1.40 mmol/L   TCO2 29 22 - 32 mmol/L   Hemoglobin 13.3 12.0 - 15.0 g/dL   HCT 39.0 36.0 -  46.0 %   Comment NOTIFIED PHYSICIAN   I-STAT, chem 8     Status: Abnormal   Collection Time: 03/23/20 11:08 AM  Result Value Ref Range   Sodium 145 135 - 145 mmol/L   Potassium 3.7 3.5 - 5.1 mmol/L   Chloride 107 98 - 111 mmol/L   BUN 15 8 - 23 mg/dL   Creatinine, Ser 0.80 0.44 - 1.00 mg/dL   Glucose, Bld 89 70 - 99 mg/dL   Calcium, Ion 1.13 (L) 1.15 - 1.40 mmol/L   TCO2 25 22 - 32 mmol/L   Hemoglobin 12.9 12.0 - 15.0 g/dL   HCT 38.0 36.0 - 46.0 %   No results found for this or any previous visit (from the past 240 hour(s)).  Renal Function: Recent Labs    03/23/20 1056 03/23/20 1108  CREATININE 0.90 0.80   Estimated Creatinine Clearance: 56.2 mL/min (by C-G formula based on SCr of 0.8 mg/dL).  Radiologic Imaging: No results found.  I independently reviewed the above imaging studies.  Assessment and Plan Juan Olthoff is a 60 y.o. female with staghorn right renal calculus  The risks, benefits and alternatives of cystoscopy with RIGHT ureteroscopy, laser lithotripsy and ureteral stent placement was discussed the patient.  Risks included, but are not limited to: bleeding, urinary tract infection, ureteral injury/avulsion, ureteral stricture formation, retained stone fragments, the possibility that multiple surgeries may be required to treat the stone(s), MI, stroke, PE and the inherent risks of general anesthesia.  The patient voices understanding and wishes to proceed.      Ellison Hughs, MD 03/23/2020, 1:18 PM  [facility] Pager: [phone]

## 2020-03-24 ENCOUNTER — Encounter (HOSPITAL_BASED_OUTPATIENT_CLINIC_OR_DEPARTMENT_OTHER): Payer: Self-pay | Admitting: Urology

## 2020-03-24 LAB — POCT I-STAT, CHEM 8
BUN: 20 mg/dL (ref 8–23)
Calcium, Ion: 1.13 mmol/L — ABNORMAL LOW (ref 1.15–1.40)
Chloride: 106 mmol/L (ref 98–111)
Creatinine, Ser: 0.9 mg/dL (ref 0.44–1.00)
Glucose, Bld: 93 mg/dL (ref 70–99)
HCT: 39 % (ref 36.0–46.0)
Hemoglobin: 13.3 g/dL (ref 12.0–15.0)
Potassium: 6.5 mmol/L (ref 3.5–5.1)
Sodium: 142 mmol/L (ref 135–145)
TCO2: 29 mmol/L (ref 22–32)

## 2020-04-28 ENCOUNTER — Encounter (HOSPITAL_BASED_OUTPATIENT_CLINIC_OR_DEPARTMENT_OTHER): Payer: Self-pay | Admitting: Urology

## 2020-04-28 ENCOUNTER — Other Ambulatory Visit: Payer: Self-pay

## 2020-04-28 NOTE — Progress Notes (Signed)
Spoke w/ via phone for pre-op interview---pt daughter Barbara Strickland Strickland due to pt speaks [employer]----  I stat             Lab results------see below COVID test ------positive covid 02-08-2020 epic Arrive at -------530 am 05-04-2020 NPO after MN NO Solid Food.  Water until 430 am then npo Med rec completed Medications to take morning of surgery -----amlodipine, atorvastatin Diabetic medication -----n/a Patient instructed to bring photo id and insurance card day of surgery Patient aware to have Driver (ride ) / caregiver  Daughter Barbara Strickland   for 24 hours after surgery  Patient Special Instructions -----none Pre-Op special Istructions -----none Patient verbalized understanding of instructions that were given at this phone interview. Patient denies shortness of breath, chest pain, fever, cough at this phone interview.  Anesthesia Review:in ed with sepsis 01-03-2020 discharged ekg prolonged qt 12-26-2019 epic, hx htn left hemorrhagic cva with left side hemaparesis uses cane, pt met [facility] guidelines per Barbara Billow zanetto pa per 02-04-2020 note epic  PCP: dr Barbara Strickland 11-03-2019 epic Cardiologist :none Chest x-ray :none EKG :01-04-2020 epic Echo :none Stress test:none Cardiac Cath :none  Activity level:  Walks in home with cane, no sob or chest pain with ambulation, can climb few steps without difficulty per daughter does not do housework Sleep Study/ CPAP : Fasting Blood Sugar :      / Checks Blood Sugar -- times a day:  n/a Blood Thinner/ Instructions /Last Dose:n/a ASA / Instructions/ Last Dose : n/a  Pt daughter Barbara Strickland to interpret day of surgery, release to be signed.

## 2020-04-29 ENCOUNTER — Other Ambulatory Visit: Payer: Self-pay | Admitting: Urology

## 2020-05-03 NOTE — Anesthesia Preprocedure Evaluation (Addendum)
Anesthesia Evaluation  Patient identified by MRN, date of birth, ID band Patient awake    Reviewed: Allergy & Precautions, NPO status , Patient's Chart, lab work & pertinent test results  History of Anesthesia Complications Negative for: history of anesthetic complications  Airway Mallampati: II  TM Distance: >3 FB Neck ROM: Full    Dental  (+) Chipped, Dental Advisory Given   Pulmonary  02/08/2020 COVID positive   breath sounds clear to auscultation       Cardiovascular hypertension, Pt. on medications (-) angina Rhythm:Regular Rate:Normal     Neuro/Psych CVA (L hemiparesis), Residual Symptoms    GI/Hepatic negative GI ROS, Neg liver ROS,   Endo/Other  negative endocrine ROS  Renal/GU Renal InsufficiencyRenal diseasestones     Musculoskeletal   Abdominal   Peds  Hematology negative hematology ROS (+)   Anesthesia Other Findings   Reproductive/Obstetrics                            Anesthesia Physical Anesthesia Plan  ASA: III  Anesthesia Plan: General   Post-op Pain Management:    Induction: Intravenous  PONV Risk Score and Plan: 3 and Ondansetron, Dexamethasone and Treatment may vary due to age or medical condition  Airway Management Planned: LMA  Additional Equipment: None  Intra-op Plan:   Post-operative Plan:   Informed Consent: I have reviewed the patients History and Physical, chart, labs and discussed the procedure including the risks, benefits and alternatives for the proposed anesthesia with the patient or authorized representative who has indicated his/her understanding and acceptance.     Dental advisory given and Interpreter used for interveiw  Plan Discussed with: CRNA and Surgeon  Anesthesia Plan Comments: (Daughter interpreting)       Anesthesia Quick Evaluation

## 2020-05-04 ENCOUNTER — Encounter (HOSPITAL_BASED_OUTPATIENT_CLINIC_OR_DEPARTMENT_OTHER)
Admission: RE | Disposition: A | Discharge status: Home / Self Care | Payer: Self-pay | Source: Home / Self Care | Attending: Urology

## 2020-05-04 ENCOUNTER — Ambulatory Visit (HOSPITAL_BASED_OUTPATIENT_CLINIC_OR_DEPARTMENT_OTHER): Payer: Medicaid Other | Admitting: Anesthesiology

## 2020-05-04 ENCOUNTER — Other Ambulatory Visit: Payer: Self-pay

## 2020-05-04 ENCOUNTER — Encounter (HOSPITAL_BASED_OUTPATIENT_CLINIC_OR_DEPARTMENT_OTHER): Payer: Self-pay | Admitting: Urology

## 2020-05-04 ENCOUNTER — Ambulatory Visit (HOSPITAL_BASED_OUTPATIENT_CLINIC_OR_DEPARTMENT_OTHER)
Admission: RE | Admit: 2020-05-04 | Discharge: 2020-05-04 | Disposition: A | Discharge status: Home / Self Care | Payer: Medicaid Other | Attending: Urology | Admitting: Urology

## 2020-05-04 DIAGNOSIS — K8020 Calculus of gallbladder without cholecystitis without obstruction: Secondary | ICD-10-CM | POA: Diagnosis not present

## 2020-05-04 DIAGNOSIS — N132 Hydronephrosis with renal and ureteral calculous obstruction: Secondary | ICD-10-CM | POA: Insufficient documentation

## 2020-05-04 DIAGNOSIS — Z8616 Personal history of COVID-19: Secondary | ICD-10-CM | POA: Insufficient documentation

## 2020-05-04 HISTORY — PX: HOLMIUM LASER APPLICATION: SHX5852

## 2020-05-04 HISTORY — PX: CYSTOSCOPY/RETROGRADE/URETEROSCOPY/STONE EXTRACTION WITH BASKET: SHX5317

## 2020-05-04 LAB — POCT I-STAT, CHEM 8
BUN: 17 mg/dL (ref 8–23)
Calcium, Ion: 1.22 mmol/L (ref 1.15–1.40)
Chloride: 101 mmol/L (ref 98–111)
Creatinine, Ser: 1.6 mg/dL — ABNORMAL HIGH (ref 0.44–1.00)
Glucose, Bld: 93 mg/dL (ref 70–99)
HCT: 36 % (ref 36.0–46.0)
Hemoglobin: 12.2 g/dL (ref 12.0–15.0)
Potassium: 3 mmol/L — ABNORMAL LOW (ref 3.5–5.1)
Sodium: 142 mmol/L (ref 135–145)
TCO2: 28 mmol/L (ref 22–32)

## 2020-05-04 SURGERY — CYSTOSCOPY, WITH CALCULUS REMOVAL USING BASKET
Anesthesia: General | Laterality: Right

## 2020-05-04 MED ORDER — MIDAZOLAM HCL 2 MG/2ML IJ SOLN: 0.5000 mg | Freq: Once | INTRAMUSCULAR | Status: DC | PRN

## 2020-05-04 MED ORDER — DEXAMETHASONE SODIUM PHOSPHATE 10 MG/ML IJ SOLN
INTRAMUSCULAR | Status: DC | PRN
  Administered 2020-05-04: 10 mg via INTRAVENOUS

## 2020-05-04 MED ORDER — CEPHALEXIN 500 MG PO CAPS: 500.0000 mg | ORAL_CAPSULE | Freq: Two times a day (BID) | ORAL | 0 refills | Status: AC

## 2020-05-04 MED ORDER — EPHEDRINE 5 MG/ML INJ
INTRAVENOUS | Status: AC
  Filled 2020-05-04: qty 10

## 2020-05-04 MED ORDER — LIDOCAINE 2% (20 MG/ML) 5 ML SYRINGE
INTRAMUSCULAR | Status: AC
  Filled 2020-05-04: qty 5

## 2020-05-04 MED ORDER — LACTATED RINGERS IV SOLN: INTRAVENOUS | Status: DC

## 2020-05-04 MED ORDER — OXYCODONE HCL 5 MG PO TABS: 5.0000 mg | ORAL_TABLET | Freq: Once | ORAL | Status: DC | PRN

## 2020-05-04 MED ORDER — FENTANYL CITRATE (PF) 100 MCG/2ML IJ SOLN
INTRAMUSCULAR | Status: DC | PRN
  Administered 2020-05-04: 50 ug via INTRAVENOUS
  Administered 2020-05-04: 25 ug via INTRAVENOUS
  Administered 2020-05-04: 50 ug via INTRAVENOUS

## 2020-05-04 MED ORDER — ACETAMINOPHEN 500 MG PO TABS
ORAL_TABLET | ORAL | Status: AC
  Filled 2020-05-04: qty 2

## 2020-05-04 MED ORDER — DIPHENHYDRAMINE HCL 50 MG/ML IJ SOLN
INTRAMUSCULAR | Status: AC
  Filled 2020-05-04: qty 1

## 2020-05-04 MED ORDER — PROPOFOL 10 MG/ML IV BOLUS
INTRAVENOUS | Status: DC | PRN
  Administered 2020-05-04: 200 mg via INTRAVENOUS

## 2020-05-04 MED ORDER — FENTANYL CITRATE (PF) 100 MCG/2ML IJ SOLN
25.0000 ug | INTRAMUSCULAR | Status: DC | PRN
Start: 2020-05-04 — End: 2020-05-04

## 2020-05-04 MED ORDER — DIPHENHYDRAMINE HCL 50 MG/ML IJ SOLN
INTRAMUSCULAR | Status: DC | PRN
  Administered 2020-05-04: 12.5 mg via INTRAVENOUS

## 2020-05-04 MED ORDER — OXYCODONE HCL 5 MG/5ML PO SOLN
5.0000 mg | Freq: Once | ORAL | Status: DC | PRN
Start: 2020-05-04 — End: 2020-05-04

## 2020-05-04 MED ORDER — MIDAZOLAM HCL 5 MG/5ML IJ SOLN
INTRAMUSCULAR | Status: DC | PRN
  Administered 2020-05-04: 1 mg via INTRAVENOUS

## 2020-05-04 MED ORDER — LIDOCAINE 2% (20 MG/ML) 5 ML SYRINGE
INTRAMUSCULAR | Status: DC | PRN
  Administered 2020-05-04: 40 mg via INTRAVENOUS

## 2020-05-04 MED ORDER — EPHEDRINE SULFATE-NACL 50-0.9 MG/10ML-% IV SOSY
PREFILLED_SYRINGE | INTRAVENOUS | Status: DC | PRN
  Administered 2020-05-04 (×2): 10 mg via INTRAVENOUS

## 2020-05-04 MED ORDER — MIDAZOLAM HCL 2 MG/2ML IJ SOLN
INTRAMUSCULAR | Status: AC
  Filled 2020-05-04: qty 2

## 2020-05-04 MED ORDER — GENTAMICIN SULFATE 40 MG/ML IJ SOLN
5.0000 mg/kg | Freq: Once | INTRAVENOUS | Status: AC
  Administered 2020-05-04: 280 mg via INTRAVENOUS
  Filled 2020-05-04: qty 7

## 2020-05-04 MED ORDER — FENTANYL CITRATE (PF) 100 MCG/2ML IJ SOLN
INTRAMUSCULAR | Status: AC
  Filled 2020-05-04: qty 2

## 2020-05-04 MED ORDER — IOHEXOL 300 MG/ML  SOLN: INTRAMUSCULAR | Status: DC | PRN

## 2020-05-04 MED ORDER — ONDANSETRON HCL 4 MG/2ML IJ SOLN
INTRAMUSCULAR | Status: DC | PRN
  Administered 2020-05-04: 4 mg via INTRAVENOUS

## 2020-05-04 MED ORDER — SODIUM CHLORIDE 0.9 % IR SOLN
Status: DC | PRN
  Administered 2020-05-04: 1500 mL
  Administered 2020-05-04: 3000 mL

## 2020-05-04 MED ORDER — DEXAMETHASONE SODIUM PHOSPHATE 10 MG/ML IJ SOLN
INTRAMUSCULAR | Status: AC
  Filled 2020-05-04: qty 1

## 2020-05-04 MED ORDER — ONDANSETRON HCL 4 MG/2ML IJ SOLN
INTRAMUSCULAR | Status: AC
  Filled 2020-05-04: qty 2

## 2020-05-04 MED ORDER — ACETAMINOPHEN 500 MG PO TABS
1000.0000 mg | ORAL_TABLET | Freq: Once | ORAL | Status: AC
  Administered 2020-05-04: 1000 mg via ORAL

## 2020-05-04 MED ORDER — ARTIFICIAL TEARS OPHTHALMIC OINT
TOPICAL_OINTMENT | OPHTHALMIC | Status: AC
  Filled 2020-05-04: qty 3.5

## 2020-05-04 MED ORDER — PROPOFOL 10 MG/ML IV BOLUS
INTRAVENOUS | Status: AC
  Filled 2020-05-04: qty 20

## 2020-05-04 MED ORDER — MEPERIDINE HCL 25 MG/ML IJ SOLN: 6.2500 mg | INTRAMUSCULAR | Status: DC | PRN

## 2020-05-04 SURGICAL SUPPLY — 23 items
BAG DRAIN URO-CYSTO SKYTR STRL (DRAIN) ×2 IMPLANT
BASKET STONE 1.7 NGAGE (UROLOGICAL SUPPLIES) IMPLANT
BASKET ZERO TIP NITINOL 2.4FR (BASKET) ×2 IMPLANT
BENZOIN TINCTURE PRP APPL 2/3 (GAUZE/BANDAGES/DRESSINGS) IMPLANT
CATH URET 5FR 28IN OPEN ENDED (CATHETERS) ×2 IMPLANT
CLOTH BEACON ORANGE TIMEOUT ST (SAFETY) ×2 IMPLANT
FIBER LASER FLEXIVA 365 (UROLOGICAL SUPPLIES) IMPLANT
GLOVE SURG ENC MOIS LTX SZ7.5 (GLOVE) ×2 IMPLANT
GOWN STRL REUS W/TWL XL LVL3 (GOWN DISPOSABLE) ×2 IMPLANT
GUIDEWIRE STR DUAL SENSOR (WIRE) IMPLANT
GUIDEWIRE ZIPWRE .038 STRAIGHT (WIRE) ×2 IMPLANT
IV NS IRRIG 3000ML ARTHROMATIC (IV SOLUTION) ×8 IMPLANT
KIT TURNOVER CYSTO (KITS) ×2 IMPLANT
MANIFOLD NEPTUNE II (INSTRUMENTS) ×2 IMPLANT
NS IRRIG 500ML POUR BTL (IV SOLUTION) ×2 IMPLANT
PACK CYSTO (CUSTOM PROCEDURE TRAY) ×2 IMPLANT
STENT URET 6FRX24 CONTOUR (STENTS) ×2 IMPLANT
STRIP CLOSURE SKIN 1/2X4 (GAUZE/BANDAGES/DRESSINGS) IMPLANT
SYR 10ML LL (SYRINGE) ×2 IMPLANT
TRACTIP FLEXIVA PULS ID 200XHI (Laser) ×1 IMPLANT
TRACTIP FLEXIVA PULSE ID 200 (Laser) ×2
TUBE CONNECTING 12X1/4 (SUCTIONS) IMPLANT
TUBING UROLOGY SET (TUBING) ×2 IMPLANT

## 2020-05-04 NOTE — Discharge Instructions (Signed)
CYSTOSCOPY HOME CARE INSTRUCTIONS ° °Activity: °Rest for the remainder of the day.  Do not drive or operate equipment today.  You may resume normal activities in one to two days as instructed by your physician.  ° °Meals: °Drink plenty of liquids and eat light foods such as gelatin or soup this evening.  You may return to a normal meal plan tomorrow. ° °Return to Work: °You may return to work in one to two days or as instructed by your physician. ° °Special Instructions / Symptoms: °Call your physician if any of these symptoms occur: ° ° -persistent or heavy bleeding ° -bleeding which continues after first few urination ° -large blood clots that are difficult to pass ° -urine stream diminishes or stops completely ° -fever equal to or higher than 101 degrees Farenheit. ° -cloudy urine with a strong, foul odor ° -severe pain ° °Females should always wipe from front to back after elimination.  You may feel some burning pain when you urinate.  This should disappear with time.  Applying moist heat to the lower abdomen or a hot tub bath may help relieve the pain.  ° ° °Post Anesthesia Home Care Instructions ° °Activity: °Get plenty of rest for the remainder of the day. A responsible individual must stay with you for 24 hours following the procedure.  °For the next 24 hours, DO NOT: °-Drive a car °-Operate machinery °-Drink alcoholic beverages °-Take any medication unless instructed by your physician °-Make any legal decisions or sign important papers. ° °Meals: °Start with liquid foods such as gelatin or soup. Progress to regular foods as tolerated. Avoid greasy, spicy, heavy foods. If nausea and/or vomiting occur, drink only clear liquids until the nausea and/or vomiting subsides. Call your physician if vomiting continues. ° °Special Instructions/Symptoms: °Your throat may feel dry or sore from the anesthesia or the breathing tube placed in your throat during surgery. If this causes discomfort, gargle with warm salt  water. The discomfort should disappear within 24 hours. ° ° °   ° ° ° °

## 2020-05-04 NOTE — Anesthesia Postprocedure Evaluation (Signed)
Anesthesia Post Note  Patient: Barbara Strickland  Procedure(s) Performed: CYSTOSCOPY/RETROGRADE/URETEROSCOPY/ HOLMIUM LASER LITHOTRIPSY/STONE EXTRACTION WITH BASKET/ STENT EXCHANGE (Right ) HOLMIUM LASER APPLICATION (Right )     Patient location during evaluation: Phase II Anesthesia Type: General Level of consciousness: awake and alert, patient cooperative and oriented Pain management: pain level controlled Vital Signs Assessment: post-procedure vital signs reviewed and stable Respiratory status: spontaneous breathing, nonlabored ventilation and respiratory function stable Cardiovascular status: blood pressure returned to baseline and stable Postop Assessment: no apparent nausea or vomiting and able to ambulate Anesthetic complications: no   No complications documented.  Last Vitals:  Vitals:   05/04/20 0930 05/04/20 0945  BP: (!) 144/88 (!) 148/104  Pulse: 72 98  Resp: 14 20  Temp:  (!) 36.4 C  SpO2: 97% 99%    Last Pain:  Vitals:   05/04/20 0945  TempSrc:   PainSc: 3                  Aseem Sessums,E. Vivianne Carles

## 2020-05-04 NOTE — Op Note (Signed)
Operative Note  Preoperative diagnosis:  1.  Numerous right distal ureteral and right lower pole calculi 2.  History of staghorn right renal stone  Postoperative diagnosis: 1.  Numerous right distal ureteral and right lower pole calculi 2.  History of staghorn right renal stone  Procedure(s): 1.  Cystoscopy with right ureteroscopy, holmium laser lithotripsy and right JJ stent exchange   Surgeon: Ellison Hughs, MD  Assistants:  None  Anesthesia:  General  Complications:  None  EBL: Less than 5 mL  Specimens: 1.  Previously placed right JJ stent was removed intact, inspected and discarded  Drains/Catheters: 1.  6 French, 24 cm JJ stent without tether  Intraoperative findings:   1. Numerous obstructing right distal ureteral stones causing significant ureteral mucosal edema 2. The lower pole stone burden was predominantly previously fractured stone that were less than or equal to 2 mm  Indication:  Barbara Strickland is a 60 y.o. female with a history of a right-sided staghorn calculus that has required multiple ureteroscopies to address her significant stone burden.  Recent surveillance imaging revealed a significant amount of residual stone burden within the distal aspects of the right ureter as well as within the lower pole of the right kidney.  She is here today for another staged ureteroscopy to address her stone burden.  She has been consented for the above procedures, voices understanding and wishes to proceed.  Description of procedure:  After informed consent was obtained, the patient was brought to the operating room and general LMA anesthesia was administered. The patient was then placed in the dorsolithotomy position and prepped and draped in the usual sterile fashion. A timeout was performed. A 23 French rigid cystoscope was then inserted into the urethral meatus and advanced into the bladder under direct vision. A complete bladder survey revealed no intravesical  pathology.  Her previously placed right JJ stent was then grasped at its distal curl and retracted to the urethral meatus.  A Glidewire was then advanced through the lumen of the stent and up to the right renal pelvis, under fluoroscopic guidance.  The stent was then removed over the wire intact, inspected and discarded.  A semirigid ureteroscope was then advanced into the distal aspects of the right ureter where a significant amount of stone burden was identified.  A 200 m holmium laser was then used to fracture the largest residual stones into numerous smaller pieces.  A 0 tip basket was then used to extract the vast majority of the stone burden within the lumen of the right ureter.  The semirigid ureteroscope was exchanged for a flexible ureteroscope, which was advanced up the right ureter to the right renal pelvis.  A complete inspection of the right renal pelvis revealed fragmented stones within the lower pole of the right kidney as well as three, 5 mm stones.  The 200 m holmium laser was then used to dust the sizable stone burden within the lower pole of the right kidney.  The flexible ureteroscope was then removed under direct vision, leaving the Glidewire in place.  A 6 French, 24 cm JJ stent was then advanced over the wire and into good position within the right collecting system, confirming placement via fluoroscopy.  The patient's bladder was drained and all stone fragments were evacuated.  She tolerated the procedure well and was transferred to the postanesthesia in stable condition.  Plan: Follow-up in 10 days for office cystoscopy and stent removal

## 2020-05-04 NOTE — Transfer of Care (Signed)
Immediate Anesthesia Transfer of Care Note  Patient: Barbara Strickland  Procedure(s) Performed: CYSTOSCOPY/RETROGRADE/URETEROSCOPY/ HOLMIUM LASER LITHOTRIPSY/STONE EXTRACTION WITH BASKET/ STENT EXCHANGE (Right ) HOLMIUM LASER APPLICATION (Right )  Patient Location: PACU  Anesthesia Type:General  Level of Consciousness: drowsy and responds to stimulation  Airway & Oxygen Therapy: Patient Spontanous Breathing  Post-op Assessment: Report given to RN and Post -op Vital signs reviewed and stable  Post vital signs: Reviewed and stable  Last Vitals:  Vitals Value Taken Time  BP 144/88 05/04/20 0930  Temp 36.4 C 05/04/20 0928  Pulse 86 05/04/20 0932  Resp 14 05/04/20 0932  SpO2 100 % 05/04/20 0932  Vitals shown include unvalidated device data.  Last Pain:  Vitals:   05/04/20 0930  TempSrc:   PainSc: Asleep         Complications: No complications documented.

## 2020-05-04 NOTE — H&P (Signed)
Urology Preoperative H&P   Chief Complaint: Kidney stones  History of Present Illness: Barbara Strickland is a 60 y.o. female with a history of a staghorn right renal calculus requiring staged ureteroscopy.  She was found to have a significant amount of residual stone burden along her right sided stent as well as in the lower pole of the right kidney on follow-up imaging. She is here today to ureteroscopically address her residual stone burden.  She reports urinary urgency and occasional episodes of hematuria, but denies flank pain, fever/chills or nausea/vomiting.   Past Medical History:  Diagnosis Date  . COVID 02/08/2020   asymptomatic  . 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 Hemorrhagic stroke ([place]) 2009  . History of kidney stones   . Hx of adenomatous colonic polyps 08/05/2017  . Hypertension   . Illiterate    pt cannot read/write english, daughter chien h signs consents  . 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 Microcytic anemia     Past Surgical History:  Procedure Laterality Date  . CYSTOSCOPY W/ URETERAL STENT PLACEMENT Right 01/03/2020   Procedure: CYSTOSCOPY WITH RETROGRADE PYELOGRAM/URETERAL STENT PLACEMENT;  Surgeon: Ceasar Mons, MD;  Location: [facility];  Service: Urology;  Laterality: Right;  . CYSTOSCOPY/URETEROSCOPY/HOLMIUM LASER/STENT PLACEMENT Right 03/09/2020   Procedure: CYSTOSCOPY/RETROGRADE/URETEROSCOPY/HOLMIUM LASER/STENT REPLACEMENT;  Surgeon: Ceasar Mons, MD;  Location: [facility];  Service: Urology;  Laterality: Right;  . CYSTOSCOPY/URETEROSCOPY/HOLMIUM LASER/STENT PLACEMENT Right 03/23/2020   Procedure: CYSTOSCOPY/STENT REMOVAL, URETEROSCOPY/HOLMIUM LASER/STENT PLACEMENT;  Surgeon: Ceasar Mons, MD;  Location: [facility];  Service: Urology;  Laterality: Right;    Allergies: No Known Allergies  Family History  Problem Relation Age of Onset  . Hypertension Neg Hx   . Hyperlipidemia Neg Hx   . Colon  cancer Neg Hx   . Colon polyps Neg Hx   . Esophageal cancer Neg Hx   . Rectal cancer Neg Hx   . Stomach cancer Neg Hx     Social History:  reports that she has never smoked. She has never used smokeless tobacco. She reports that she does not drink alcohol and does not use drugs.  ROS: A complete review of systems was performed.  All systems are negative except for pertinent findings as noted.  Physical Exam:  Vital signs in last 24 hours: Temp:  [97.8 F (36.6 C)] 97.8 F (36.6 C) (03/30 0546) Pulse Rate:  [77] 77 (03/30 0546) Resp:  [15] 15 (03/30 0546) BP: (138)/(85) 138/85 (03/30 0546) SpO2:  [99 %] 99 % (03/30 0546) Weight:  [56.2 kg] 56.2 kg (03/30 0546) Constitutional:  Alert and oriented, No acute distress Cardiovascular: Regular rate and rhythm, No JVD Respiratory: Normal respiratory effort, Lungs clear bilaterally GI: Abdomen is soft, nontender, nondistended, no abdominal masses GU: No CVA tenderness Lymphatic: No lymphadenopathy Neurologic: Grossly intact, no focal deficits Psychiatric: Normal mood and affect  Laboratory Data:  Recent Labs    05/04/20 0630  HGB 12.2  HCT 36.0    Recent Labs    05/04/20 0630  NA 142  K 3.0*  CL 101  GLUCOSE 93  BUN 17  CREATININE 1.60*     Results for orders placed or performed during the hospital encounter of 05/04/20 (from the past 24 hour(s))  I-STAT, chem 8     Status: Abnormal   Collection Time: 05/04/20  6:30 AM  Result Value Ref Range   Sodium 142 135 - 145 mmol/L   Potassium 3.0 (L) 3.5 -  5.1 mmol/L   Chloride 101 98 - 111 mmol/L   BUN 17 8 - 23 mg/dL   Creatinine, Ser 1.60 (H) 0.44 - 1.00 mg/dL   Glucose, Bld 93 70 - 99 mg/dL   Calcium, Ion 1.22 1.15 - 1.40 mmol/L   TCO2 28 22 - 32 mmol/L   Hemoglobin 12.2 12.0 - 15.0 g/dL   HCT 36.0 36.0 - 46.0 %   No results found for this or any previous visit (from the past 240 hour(s)).  Renal Function: Recent Labs    05/04/20 0630  CREATININE 1.60*    Estimated Creatinine Clearance: 27.9 mL/min (A) (by C-G formula based on SCr of 1.6 mg/dL (H)).  Radiologic Imaging: CLINICAL DATA:  Renal calculus     EXAM:  CT ABDOMEN AND PELVIS WITHOUT CONTRAST     TECHNIQUE:  Multidetector CT imaging of the abdomen and pelvis was performed  following the standard protocol without IV contrast.     COMPARISON:  None.     FINDINGS:  Lower chest: No acute abnormality.     Hepatobiliary: No solid liver abnormality is seen. Gallstones in the  dependent gallbladder. No gallbladder wall thickening or biliary  ductal dilatation.     Pancreas: Unremarkable. No pancreatic ductal dilatation or  surrounding inflammatory changes.     Spleen: Normal in size without significant abnormality.     Adrenals/Urinary Tract: Adrenal glands are unremarkable. There is a  right-sided double-J ureteral catheter with formed pigtails in the  right renal pelvis and urinary bladder. Severe right hydronephrosis  and hydroureter. There are numerous calculi and or conglomerate  within the calices of the right kidney as well as at multiple  locations within the ureter and almost completely filling the distal  third of the right ureter. There are no left-sided calculi or  hydronephrosis. Bladder is unremarkable.     Stomach/Bowel: Stomach is within normal limits. Appendix appears  normal. No evidence of bowel wall thickening, distention, or  inflammatory changes.     Vascular/Lymphatic: No significant vascular findings are present. No  enlarged abdominal or pelvic lymph nodes.     Reproductive: No mass or other significant abnormality.     Other: No abdominal wall hernia or abnormality. No abdominopelvic  ascites.     Musculoskeletal: No acute or significant osseous findings.     IMPRESSION:  1. There is a right-sided double-J ureteral catheter with formed  pigtails in the right renal pelvis and urinary bladder. Severe right  hydronephrosis and hydroureter.   2. There are numerous calculi and/or conglomerates within the  calices of the right kidney as well as at multiple locations within  the ureter and almost completely filling the distal third of the  right ureter. There are no left-sided calculi or hydronephrosis.  3. Cholelithiasis.        Electronically Signed    By: Eddie Candle M.D.    On: 04/09/2020 09:33     I independently reviewed the above imaging studies.  Assessment and Plan Nicol Herbig is a 61 y.o. female with a history of a right staghorn calculus requiring staged ureteroscopy  The risks, benefits and alternatives of cystoscopy with RIGHT ureteroscopy, laser lithotripsy and ureteral stent placement was discussed the patient.  Risks included, but are not limited to: bleeding, urinary tract infection, ureteral injury/avulsion, ureteral stricture formation, retained stone fragments, the possibility that multiple surgeries may be required to treat the stone(s), MI, stroke, PE and the inherent risks of general anesthesia.  The patient  voices understanding and wishes to proceed.     Ellison Hughs, MD 05/04/2020, 7:42 AM  [facility] Pager: [phone]

## 2020-05-04 NOTE — Anesthesia Procedure Notes (Signed)
Procedure Name: LMA Insertion Date/Time: 05/04/2020 7:56 AM Performed by: Rogers Blocker, CRNA Pre-anesthesia Checklist: Patient identified, Emergency Drugs available, Suction available and Patient being monitored Patient Re-evaluated:Patient Re-evaluated prior to induction Oxygen Delivery Method: Circle System Utilized Preoxygenation: Pre-oxygenation with 100% oxygen Induction Type: IV induction Ventilation: Mask ventilation without difficulty LMA: LMA inserted LMA Size: 3.0 Number of attempts: 1 Placement Confirmation: positive ETCO2 Tube secured with: Tape Dental Injury: Teeth and Oropharynx as per pre-operative assessment

## 2020-05-05 ENCOUNTER — Encounter (HOSPITAL_BASED_OUTPATIENT_CLINIC_OR_DEPARTMENT_OTHER): Payer: Self-pay | Admitting: Urology

## 2020-06-24 ENCOUNTER — Ambulatory Visit
Admission: RE | Admit: 2020-06-24 | Discharge: 2020-06-24 | Disposition: A | Discharge status: Home / Self Care | Payer: Medicaid Other | Source: Ambulatory Visit | Attending: Emergency Medicine | Admitting: Emergency Medicine

## 2020-06-24 ENCOUNTER — Other Ambulatory Visit: Payer: Self-pay

## 2020-06-24 DIAGNOSIS — Z1231 Encounter for screening mammogram for malignant neoplasm of breast: Secondary | ICD-10-CM

## 2020-06-28 ENCOUNTER — Other Ambulatory Visit: Payer: Self-pay | Admitting: Emergency Medicine

## 2020-06-28 DIAGNOSIS — R928 Other abnormal and inconclusive findings on diagnostic imaging of breast: Secondary | ICD-10-CM

## 2020-07-19 ENCOUNTER — Ambulatory Visit: Payer: Medicaid Other

## 2020-07-19 ENCOUNTER — Other Ambulatory Visit: Payer: Self-pay

## 2020-07-19 ENCOUNTER — Ambulatory Visit
Admission: RE | Admit: 2020-07-19 | Discharge: 2020-07-19 | Disposition: A | Discharge status: Home / Self Care | Payer: Medicaid Other | Source: Ambulatory Visit | Attending: Emergency Medicine | Admitting: Emergency Medicine

## 2020-07-19 DIAGNOSIS — R928 Other abnormal and inconclusive findings on diagnostic imaging of breast: Secondary | ICD-10-CM

## 2020-08-16 ENCOUNTER — Ambulatory Visit: Payer: Medicaid Other

## 2020-09-05 ENCOUNTER — Encounter: Payer: Self-pay | Admitting: Emergency Medicine

## 2020-09-05 ENCOUNTER — Ambulatory Visit (INDEPENDENT_AMBULATORY_CARE_PROVIDER_SITE_OTHER): Payer: Medicaid Other | Admitting: Emergency Medicine

## 2020-09-05 ENCOUNTER — Other Ambulatory Visit: Payer: Self-pay

## 2020-09-05 VITALS — BP 122/82 | HR 80 | Temp 98.7°F | Ht 59.0 in | Wt 125.0 lb

## 2020-09-05 DIAGNOSIS — Z87442 Personal history of urinary calculi: Secondary | ICD-10-CM | POA: Diagnosis not present

## 2020-09-05 DIAGNOSIS — I10 Essential (primary) hypertension: Secondary | ICD-10-CM | POA: Diagnosis not present

## 2020-09-05 DIAGNOSIS — E785 Hyperlipidemia, unspecified: Secondary | ICD-10-CM | POA: Diagnosis not present

## 2020-09-05 DIAGNOSIS — Z8673 Personal history of transient ischemic attack (TIA), and cerebral infarction without residual deficits: Secondary | ICD-10-CM | POA: Diagnosis not present

## 2020-09-05 DIAGNOSIS — G8114 Spastic hemiplegia affecting left nondominant side: Secondary | ICD-10-CM

## 2020-09-05 NOTE — Assessment & Plan Note (Signed)
Importance of secondary prevention of stroke discussed. Blood pressure control and treatment of dyslipidemia discussed.

## 2020-09-05 NOTE — Assessment & Plan Note (Signed)
Asymptomatic.  Doing well.

## 2020-09-05 NOTE — Patient Instructions (Signed)

## 2020-09-05 NOTE — Progress Notes (Signed)
Barbara Strickland 60 y.o.   Chief Complaint  Patient presents with   Follow-up    6 month f/u hypertension    HISTORY OF PRESENT ILLNESS: This is a 60 y.o. female with history of hypertension status post hemorrhagic stroke in 2009 presently on amlodipine 10 mg daily, losartan 50 mg daily and Lipitor 40 mg daily here for follow-up.  Accompanied by daughter and interpreter. Doing well.  Has no complaints or medical concerns. Up-to-date with colonoscopy and mammogram. Walks daily.  Eating well. Has history of kidney stones status post surgery last March.  No complications.  Doing well.  Urinating well.  HPI   Prior to Admission medications   Medication Sig Start Date End Date Taking? Authorizing Provider  amLODipine (NORVASC) 10 MG tablet Take 1 tablet (10 mg total) by mouth daily. 03/07/20  Yes Tai Syfert, Ines Bloomer, MD  atorvastatin (LIPITOR) 40 MG tablet Take 40 mg by mouth daily.   Yes [provider]  HYDROcodone-acetaminophen (NORCO) 5-325 MG tablet Take 1 tablet by mouth every 4 (four) hours as needed for moderate pain. 03/09/20  Yes Ceasar Mons, MD  losartan (COZAAR) 50 MG tablet Take 1 tablet (50 mg total) by mouth daily. 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[facility])    Hemorrhagic stroke ([place]) 2009   History of kidney stones    Hx of adenomatous colonic polyps 08/05/2017   Hypertension    Illiterate    pt  cannot read/write english, daughter chien h signs consents   Left spastic hemiparesis ([place])    uses cane   Microcytic anemia     Past Surgical History:  Procedure Laterality Date   CYSTOSCOPY W/ URETERAL STENT PLACEMENT Right 01/03/2020   Procedure: CYSTOSCOPY WITH RETROGRADE PYELOGRAM/URETERAL STENT PLACEMENT;  Surgeon: Ceasar Mons, MD;  Location: [facility];  Service: Urology;  Laterality: Right;   CYSTOSCOPY/RETROGRADE/URETEROSCOPY/STONE EXTRACTION WITH BASKET Right 05/04/2020   Procedure: CYSTOSCOPY/RETROGRADE/URETEROSCOPY/ HOLMIUM LASER LITHOTRIPSY/STONE EXTRACTION WITH BASKET/ STENT EXCHANGE;  Surgeon: Ceasar Mons, MD;  Location: [facility];  Service: Urology;  Laterality: Right;   CYSTOSCOPY/URETEROSCOPY/HOLMIUM LASER/STENT PLACEMENT Right 03/09/2020   Procedure: CYSTOSCOPY/RETROGRADE/URETEROSCOPY/HOLMIUM LASER/STENT REPLACEMENT;  Surgeon: Ceasar Mons, MD;  Location[facility];  Service: Urology;  Laterality: Right;   CYSTOSCOPY/URETEROSCOPY/HOLMIUM LASER/STENT PLACEMENT Right 03/23/2020   Procedure: CYSTOSCOPY/STENT REMOVAL, URETEROSCOPY/HOLMIUM LASER/STENT PLACEMENT;  Surgeon: Ceasar Mons, MD;  Location: [facility];  Service: Urology;  Laterality: Right;   HOLMIUM LASER APPLICATION Right 7/54/4920   Procedure: HOLMIUM LASER APPLICATION;  Surgeon: Ceasar Mons, MD;  Location: [facility];  Service: Urology;  Laterality: Right;    Social History   Socioeconomic History   Marital status: Married    Spouse name: Not on file   Number of children: 4   Years of education: Not on file   Highest education level: Not on file  Occupational History   Not on  file  Tobacco Use   Smoking status: Never   Smokeless tobacco: Never  Vaping Use   Vaping Use: Never used  Substance and Sexual Activity   Alcohol use: Never   Drug use: Never   Sexual activity: Not on  file  Other Topics Concern   Not on file  Social History Narrative   ** Merged History Encounter **       Social Determinants of Health   Financial Resource Strain: Not on file  Food Insecurity: Not on file  Transportation Needs: Not on file  Physical Activity: Not on file  Stress: Not on file  Social Connections: Not on file  Intimate Partner Violence: Not on file    Family History  Problem Relation Age of Onset   Hypertension Neg Hx    Hyperlipidemia Neg Hx    Colon cancer Neg Hx    Colon polyps Neg Hx    Esophageal cancer Neg Hx    Rectal cancer Neg Hx    Stomach cancer Neg Hx    Breast cancer Neg Hx    BRCA 1/2 Neg Hx      Review of Systems  Constitutional: Negative.  Negative for chills and fever.  HENT: Negative.  Negative for congestion and sore throat.   Respiratory: Negative.  Negative for cough and shortness of breath.   Cardiovascular: Negative.  Negative for chest pain and palpitations.  Gastrointestinal:  Negative for abdominal pain, diarrhea, nausea and vomiting.  Genitourinary: Negative.  Negative for dysuria and hematuria.  Skin: Negative.  Negative for rash.  Neurological:  Negative for dizziness and headaches.  All other systems reviewed and are negative. BP Readings from Last 3 Encounters:  05/04/20 (!) 155/95  03/23/20 127/85  03/09/20 135/82   Vitals:   09/05/20 0819  BP: 122/82  Pulse: 80  Temp: 98.7 F (37.1 C)     Physical Exam Vitals reviewed.  Constitutional:      Appearance: Normal appearance.  HENT:     Head: Normocephalic.  Eyes:     Extraocular Movements: Extraocular movements intact.     Conjunctiva/sclera: Conjunctivae normal.     Pupils: Pupils are equal, round, and reactive to light.  Cardiovascular:     Rate and Rhythm: Normal rate and regular rhythm.     Pulses: Normal pulses.     Heart sounds: Normal heart sounds.  Pulmonary:     Effort: Pulmonary effort is normal.     Breath sounds: Normal breath sounds.   Musculoskeletal:     Cervical back: Normal range of motion and neck supple. No tenderness.  Lymphadenopathy:     Cervical: No cervical adenopathy.  Skin:    General: Skin is warm and dry.  Neurological:     Mental Status: She is alert and oriented to person, place, and time. Mental status is at baseline.     Comments: Left spastic hemiparesis  Psychiatric:        Mood and Affect: Mood normal.        Behavior: Behavior normal.     ASSESSMENT & PLAN: History of stroke Importance of secondary prevention of stroke discussed. Blood pressure control and treatment of dyslipidemia discussed.  Essential hypertension Well-controlled hypertension. Continue amlodipine 10 mg daily and losartan 50 mg daily. Dietary approach to stop hypertension discussed. Follow-up in 6 months.  History of kidney stones Asymptomatic.  Doing well.  Dyslipidemia Diet and nutrition discussed.  Continue atorvastatin 40 mg daily.  Wynne was seen today for follow-up.  Diagnoses  and all orders for this visit:  Essential hypertension -     Comprehensive metabolic panel  History of stroke  Dyslipidemia -     Comprehensive metabolic panel -     Lipid panel  History of kidney stones  Left spastic hemiparesis (HCC)  Patient Instructions  Hypertension, Adult High blood pressure (hypertension) is when the force of blood pumping through the arteries is too strong. The arteries are the blood vessels that carry blood from the heart throughout the body. Hypertension forces the heart to work harder to pump blood and may cause arteries to become narrow or stiff. Untreated or uncontrolled hypertension can cause a heart attack, heart failure, a stroke, kidney disease, and otherproblems. A blood pressure reading consists of a higher number over a lower number. Ideally, your blood pressure should be below 120/80. The first ("top") number is called the systolic pressure. It is a measure of the pressure in your  arteries as your heart beats. The second ("bottom") number is called the diastolic pressure. It is a measure of the pressure in your arteries as theheart relaxes. What are the causes? The exact cause of this condition is not known. There are some conditions thatresult in or are related to high blood pressure. What increases the risk? Some risk factors for high blood pressure are under your control. The following factors may make you more likely to develop this condition: Smoking. Having type 2 diabetes mellitus, high cholesterol, or both. Not getting enough exercise or physical activity. Being overweight. Having too much fat, sugar, calories, or salt (sodium) in your diet. Drinking too much alcohol. Some risk factors for high blood pressure may be difficult or impossible to change. Some of these factors include: Having chronic kidney disease. Having a family history of high blood pressure. Age. Risk increases with age. Race. You may be at higher risk if you are African American. Gender. Men are at higher risk than women before age 31. After age 1, women are at higher risk than men. Having obstructive sleep apnea. Stress. What are the signs or symptoms? High blood pressure may not cause symptoms. Very high blood pressure (hypertensive crisis) may cause: Headache. Anxiety. Shortness of breath. Nosebleed. Nausea and vomiting. Vision changes. Severe chest pain. Seizures. How is this diagnosed? This condition is diagnosed by measuring your blood pressure while you are seated, with your arm resting on a flat surface, your legs uncrossed, and your feet flat on the floor. The cuff of the blood pressure monitor will be placed directly against the skin of your upper arm at the level of your heart. It should be measured at least twice using the same arm. Certain conditions cancause a difference in blood pressure between your right and left arms. Certain factors can cause blood pressure readings  to be lower or higher than normal for a short period of time: When your blood pressure is higher when you are in a health care provider's office than when you are at home, this is called white coat hypertension. Most people with this condition do not need medicines. When your blood pressure is higher at home than when you are in a health care provider's office, this is called masked hypertension. Most people with this condition may need medicines to control blood pressure. If you have a high blood pressure reading during one visit or you have normal blood pressure with other risk factors, you may be asked to: Return on a different day to have your blood pressure checked  again. Monitor your blood pressure at home for 1 week or longer. If you are diagnosed with hypertension, you may have other blood or imaging tests to help your health care provider understand your overall risk for otherconditions. How is this treated? This condition is treated by making healthy lifestyle changes, such as eating healthy foods, exercising more, and reducing your alcohol intake. Your health care provider may prescribe medicine if lifestyle changes are not enough to get your blood pressure under control, and if: Your systolic blood pressure is above 130. Your diastolic blood pressure is above 80. Your personal target blood pressure may vary depending on your medicalconditions, your age, and other factors. Follow these instructions at home: Eating and drinking  Eat a diet that is high in fiber and potassium, and low in sodium, added sugar, and fat. An example eating plan is called the DASH (Dietary Approaches to Stop Hypertension) diet. To eat this way: Eat plenty of fresh fruits and vegetables. Try to fill one half of your plate at each meal with fruits and vegetables. Eat whole grains, such as whole-wheat pasta, brown rice, or whole-grain bread. Fill about one fourth of your plate with whole grains. Eat or drink  low-fat dairy products, such as skim milk or low-fat yogurt. Avoid fatty cuts of meat, processed or cured meats, and poultry with skin. Fill about one fourth of your plate with lean proteins, such as fish, chicken without skin, beans, eggs, or tofu. Avoid pre-made and processed foods. These tend to be higher in sodium, added sugar, and fat. Reduce your daily sodium intake. Most people with hypertension should eat less than 1,500 mg of sodium a day. Do not drink alcohol if: Your health care provider tells you not to drink. You are pregnant, may be pregnant, or are planning to become pregnant. If you drink alcohol: Limit how much you use to: 0-1 drink a day for women. 0-2 drinks a day for men. Be aware of how much alcohol is in your drink. In the [place] one drink equals one 12 oz bottle of beer (355 mL), one 5 oz glass of wine (148 mL), or one 1 oz glass of hard liquor (44 mL).  Lifestyle  Work with your health care provider to maintain a healthy body weight or to lose weight. Ask what an ideal weight is for you. Get at least 30 minutes of exercise most days of the week. Activities may include walking, swimming, or biking. Include exercise to strengthen your muscles (resistance exercise), such as Pilates or lifting weights, as part of your weekly exercise routine. Try to do these types of exercises for 30 minutes at least 3 days a week. Do not use any products that contain nicotine or tobacco, such as cigarettes, e-cigarettes, and chewing tobacco. If you need help quitting, ask your health care provider. Monitor your blood pressure at home as told by your health care provider. Keep all follow-up visits as told by your health care provider. This is important.  Medicines Take over-the-counter and prescription medicines only as told by your health care provider. Follow directions carefully. Blood pressure medicines must be taken as prescribed. Do not skip doses of blood pressure medicine. Doing  this puts you at risk for problems and can make the medicine less effective. Ask your health care provider about side effects or reactions to medicines that you should watch for. Contact a health care provider if you: Think you are having a reaction to a medicine you are taking. Have  headaches that keep coming back (recurring). Feel dizzy. Have swelling in your ankles. Have trouble with your vision. Get help right away if you: Develop a severe headache or confusion. Have unusual weakness or numbness. Feel faint. Have severe pain in your chest or abdomen. Vomit repeatedly. Have trouble breathing. Summary Hypertension is when the force of blood pumping through your arteries is too strong. If this condition is not controlled, it may put you at risk for serious complications. Your personal target blood pressure may vary depending on your medical conditions, your age, and other factors. For most people, a normal blood pressure is less than 120/80. Hypertension is treated with lifestyle changes, medicines, or a combination of both. Lifestyle changes include losing weight, eating a healthy, low-sodium diet, exercising more, and limiting alcohol. This information is not intended to replace advice given to you by your health care provider. Make sure you discuss any questions you have with your healthcare provider. Document Revised: 10/02/2017 Document Reviewed: 10/02/2017 Elsevier Patient Education  2022 [place], [place] [facility]

## 2020-09-05 NOTE — Assessment & Plan Note (Signed)
Diet and nutrition discussed. Continue atorvastatin 40 mg daily. 

## 2020-09-05 NOTE — Assessment & Plan Note (Signed)
Well-controlled hypertension. Continue amlodipine 10 mg daily and losartan 50 mg daily. Dietary approach to stop hypertension discussed. Follow-up in 6 months.

## 2020-10-11 ENCOUNTER — Other Ambulatory Visit: Payer: Self-pay | Admitting: Emergency Medicine

## 2020-10-11 DIAGNOSIS — I10 Essential (primary) hypertension: Secondary | ICD-10-CM

## 2021-03-08 ENCOUNTER — Ambulatory Visit: Payer: Medicaid Other | Admitting: Emergency Medicine

## 2021-03-14 ENCOUNTER — Ambulatory Visit (INDEPENDENT_AMBULATORY_CARE_PROVIDER_SITE_OTHER): Payer: Medicaid Other | Admitting: Emergency Medicine

## 2021-03-14 ENCOUNTER — Other Ambulatory Visit: Payer: Self-pay

## 2021-03-14 ENCOUNTER — Encounter: Payer: Self-pay | Admitting: Emergency Medicine

## 2021-03-14 VITALS — BP 130/70 | HR 86 | Temp 98.4°F | Ht 59.0 in | Wt 127.0 lb

## 2021-03-14 DIAGNOSIS — Z8673 Personal history of transient ischemic attack (TIA), and cerebral infarction without residual deficits: Secondary | ICD-10-CM | POA: Diagnosis not present

## 2021-03-14 DIAGNOSIS — E785 Hyperlipidemia, unspecified: Secondary | ICD-10-CM

## 2021-03-14 DIAGNOSIS — G8114 Spastic hemiplegia affecting left nondominant side: Secondary | ICD-10-CM

## 2021-03-14 DIAGNOSIS — R7303 Prediabetes: Secondary | ICD-10-CM

## 2021-03-14 DIAGNOSIS — I10 Essential (primary) hypertension: Secondary | ICD-10-CM | POA: Diagnosis not present

## 2021-03-14 LAB — LIPID PANEL
Cholesterol: 176 mg/dL (ref 0–200)
HDL: 75.1 mg/dL (ref >39.00)
LDL Cholesterol: 83 mg/dL (ref 0–99)
NonHDL: 100.54
Total CHOL/HDL Ratio: 2
Triglycerides: 90 mg/dL (ref 0.0–149.0)
VLDL: 18 mg/dL (ref 0.0–40.0)

## 2021-03-14 LAB — COMPREHENSIVE METABOLIC PANEL
ALT: 11 U/L (ref 0–35)
AST: 19 U/L (ref 0–37)
Albumin: 4 g/dL (ref 3.5–5.2)
Alkaline Phosphatase: 43 U/L (ref 39–117)
BUN: 13 mg/dL (ref 6–23)
CO2: 32 mEq/L (ref 19–32)
Calcium: 8.9 mg/dL (ref 8.4–10.5)
Chloride: 106 mEq/L (ref 96–112)
Creatinine, Ser: 0.99 mg/dL (ref 0.40–1.20)
GFR: 60.86 mL/min (ref >60.00)
Glucose, Bld: 86 mg/dL (ref 70–99)
Potassium: 3.8 mEq/L (ref 3.5–5.1)
Sodium: 141 mEq/L (ref 135–145)
Total Bilirubin: 0.7 mg/dL (ref 0.2–1.2)
Total Protein: 7.6 g/dL (ref 6.0–8.3)

## 2021-03-14 LAB — HEMOGLOBIN A1C: Hgb A1c MFr Bld: 6 % (ref 4.6–6.5)

## 2021-03-14 NOTE — Assessment & Plan Note (Signed)
Diet and nutrition discussed.  Stable. Lab Results  Component Value Date   HGBA1C 6.0 03/14/2021

## 2021-03-14 NOTE — Assessment & Plan Note (Signed)
Secondary prevention discussed.

## 2021-03-14 NOTE — Assessment & Plan Note (Signed)
Stable.  Normal lipid profile done today. Lab Results  Component Value Date   CHOL 176 03/14/2021   HDL 75.10 03/14/2021   LDLCALC 83 03/14/2021   TRIG 90.0 03/14/2021   CHOLHDL 2 03/14/2021  Continue atorvastatin 40 mg daily.

## 2021-03-14 NOTE — Assessment & Plan Note (Signed)
Well-controlled hypertension with normal blood pressure readings at home. Continue losartan 50 and amlodipine 10 mg daily.

## 2021-03-14 NOTE — Patient Instructions (Signed)

## 2021-03-14 NOTE — Progress Notes (Signed)
Barbara Strickland 61 y.o.   Chief complaint: Hypertension follow-up  HISTORY OF PRESENT ILLNESS: This is a 61 y.o. female with history of hypertension and stroke in the past here with daughter and interpreter for follow-up. Doing well.  Has no complaints or medical concerns. Blood pressure readings at home normal.  HPI   Prior to Admission medications   Medication Sig Start Date End Date Taking? Authorizing Provider  amLODipine (NORVASC) 10 MG tablet Take 1 tablet (10 mg total) by mouth daily. 03/07/20  Yes Sagardia, Ines Bloomer, MD  atorvastatin (LIPITOR) 40 MG tablet TAKE 1 TABLET(40 MG) BY MOUTH DAILY 10/11/20  Yes Sagardia, Ines Bloomer, MD  HYDROcodone-acetaminophen (NORCO) 5-325 MG tablet Take 1 tablet by mouth every 4 (four) hours as needed for moderate pain. 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/2015   Essential hypertension 06/21/2015    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[facility])    Hemorrhagic stroke ([place]) 2009   History of kidney stones    Hx of adenomatous colonic polyps 08/05/2017   Hypertension    Illiterate    pt cannot read/write english, daughter chien h signs consents   Left spastic hemiparesis ([place])    uses cane   Microcytic anemia     Past Surgical History:  Procedure Laterality Date   CYSTOSCOPY W/ URETERAL STENT PLACEMENT  Right 01/03/2020   Procedure: CYSTOSCOPY WITH RETROGRADE PYELOGRAM/URETERAL STENT PLACEMENT;  Surgeon: Ceasar Mons, MD;  Location: [facility];  Service: Urology;  Laterality: Right;   CYSTOSCOPY/RETROGRADE/URETEROSCOPY/STONE EXTRACTION WITH BASKET Right 05/04/2020   Procedure: CYSTOSCOPY/RETROGRADE/URETEROSCOPY/ HOLMIUM LASER LITHOTRIPSY/STONE EXTRACTION WITH BASKET/ STENT EXCHANGE;  Surgeon: Ceasar Mons, MD;  Location[facility];  Service: Urology;  Laterality: Right;   CYSTOSCOPY/URETEROSCOPY/HOLMIUM LASER/STENT PLACEMENT Right 03/09/2020   Procedure: CYSTOSCOPY/RETROGRADE/URETEROSCOPY/HOLMIUM LASER/STENT REPLACEMENT;  Surgeon: Ceasar Mons, MD;  Location: [facility];  Service: Urology;  Laterality: Right;   CYSTOSCOPY/URETEROSCOPY/HOLMIUM LASER/STENT PLACEMENT Right 03/23/2020   Procedure: CYSTOSCOPY/STENT REMOVAL, URETEROSCOPY/HOLMIUM LASER/STENT PLACEMENT;  Surgeon: Ceasar Mons, MD;  Location: [facility];  Service: Urology;  Laterality: Right;   HOLMIUM LASER APPLICATION Right 09/07/2120   Procedure: HOLMIUM LASER APPLICATION;  Surgeon: Ceasar Mons, MD;  Location: [facility];  Service: Urology;  Laterality: Right;    Social History   Socioeconomic History   Marital status: Married    Spouse name: Not on file   Number of children: 4   Years of education: Not on file   Highest education level: Not on file  Occupational History   Not on file  Tobacco Use   Smoking status: Never   Smokeless tobacco: Never  Vaping Use   Vaping Use: Never used  Substance and Sexual Activity   Alcohol use: Never   Drug use: Never   Sexual activity: Not on file  Other Topics  Concern   Not on file  Social History Narrative   ** Merged History Encounter **       Social Determinants of Health   Financial Resource Strain: Not on file  Food Insecurity: Not on  file  Transportation Needs: Not on file  Physical Activity: Not on file  Stress: Not on file  Social Connections: Not on file  Intimate Partner Violence: Not on file    Family History  Problem Relation Age of Onset   Hypertension Neg Hx    Hyperlipidemia Neg Hx    Colon cancer Neg Hx    Colon polyps Neg Hx    Esophageal cancer Neg Hx    Rectal cancer Neg Hx    Stomach cancer Neg Hx    Breast cancer Neg Hx    BRCA 1/2 Neg Hx      Review of Systems  Constitutional: Negative.  Negative for chills and fever.  HENT: Negative.  Negative for congestion and sore throat.   Respiratory: Negative.  Negative for cough and shortness of breath.   Cardiovascular: Negative.  Negative for chest pain and palpitations.  Gastrointestinal: Negative.  Negative for abdominal pain, diarrhea, nausea and vomiting.  Genitourinary: Negative.   Skin: Negative.  Negative for rash.  Neurological: Negative.  Negative for dizziness and headaches.  All other systems reviewed and are negative.   Physical Exam Vitals reviewed.  Constitutional:      Appearance: Normal appearance.  HENT:     Head: Normocephalic.  Eyes:     Extraocular Movements: Extraocular movements intact.     Pupils: Pupils are equal, round, and reactive to light.  Cardiovascular:     Rate and Rhythm: Normal rate and regular rhythm.     Pulses: Normal pulses.     Heart sounds: Normal heart sounds.  Pulmonary:     Effort: Pulmonary effort is normal.     Breath sounds: Normal breath sounds.  Abdominal:     Palpations: Abdomen is soft.     Tenderness: There is no abdominal tenderness.  Musculoskeletal:     Cervical back: No tenderness.  Lymphadenopathy:     Cervical: No cervical adenopathy.  Skin:    General: Skin is warm and dry.     Capillary Refill: Capillary refill takes less than 2 seconds.  Neurological:     Mental Status: She is alert and oriented to person, place, and time. Mental status is at baseline.   Psychiatric:        Mood and Affect: Mood normal.        Behavior: Behavior normal.   Results for orders placed or performed in visit on 03/14/21 (from the past 24 hour(s))  Comprehensive metabolic panel     Status: None   Collection Time: 03/14/21  9:21 AM  Result Value Ref Range   Sodium 141 135 - 145 mEq/L   Potassium 3.8 3.5 - 5.1 mEq/L   Chloride 106 96 - 112 mEq/L   CO2 32 19 - 32 mEq/L   Glucose, Bld 86 70 - 99 mg/dL   BUN 13 6 - 23 mg/dL   Creatinine, Ser 0.99 0.40 - 1.20 mg/dL   Total Bilirubin 0.7 0.2 - 1.2 mg/dL   Alkaline Phosphatase 43 39 - 117 U/L   AST 19 0 - 37 U/L   ALT 11 0 - 35 U/L   Total Protein 7.6 6.0 - 8.3 g/dL   Albumin 4.0 3.5 - 5.2 g/dL   GFR 60.86 >60.00 mL/min  Calcium 8.9 8.4 - 10.5 mg/dL  Lipid panel     Status: None   Collection Time: 03/14/21  9:21 AM  Result Value Ref Range   Cholesterol 176 0 - 200 mg/dL   Triglycerides 90.0 0.0 - 149.0 mg/dL   HDL 75.10 >39.00 mg/dL   VLDL 18.0 0.0 - 40.0 mg/dL   LDL Cholesterol 83 0 - 99 mg/dL   Total CHOL/HDL Ratio 2    NonHDL 100.54   Hemoglobin A1c     Status: None   Collection Time: 03/14/21  9:21 AM  Result Value Ref Range   Hgb A1c MFr Bld 6.0 4.6 - 6.5 %      ASSESSMENT & PLAN: A total of 49 minutes was spent with the patient and counseling/coordination of care regarding preparing for this visit, review of most recent office visit notes, review of chronic medical problems and their management, review of all medications, review of most recent blood work results done today, education on nutrition, secondary prevention of stroke, cardiovascular risks associated with uncontrolled hypertension, prognosis, documentation, and need for follow-up.  Problem List Items Addressed This Visit       Cardiovascular and Mediastinum   Essential hypertension - Primary    Well-controlled hypertension with normal blood pressure readings at home. Continue losartan 50 and amlodipine 10 mg daily.       Relevant Orders   Comprehensive metabolic panel (Completed)   Lipid panel (Completed)   Hemoglobin A1c (Completed)     Nervous and Auditory   Left spastic hemiparesis (HCC)     Other   Dyslipidemia    Stable.  Normal lipid profile done today. Lab Results  Component Value Date   CHOL 176 03/14/2021   HDL 75.10 03/14/2021   LDLCALC 83 03/14/2021   TRIG 90.0 03/14/2021   CHOLHDL 2 03/14/2021  Continue atorvastatin 40 mg daily.       Relevant Orders   Lipid panel (Completed)   History of stroke    Secondary prevention discussed.      Prediabetes    Diet and nutrition discussed.  Stable. Lab Results  Component Value Date   HGBA1C 6.0 03/14/2021         Patient Instructions  Hypertension, Adult High blood pressure (hypertension) is when the force of blood pumping through the arteries is too strong. The arteries are the blood vessels that carry blood from the heart throughout the body. Hypertension forces the heart to work harder to pump blood and may cause arteries to become narrow or stiff. Untreated or uncontrolled hypertension can cause a heart attack, heart failure, a stroke, kidney disease, and other problems. A blood pressure reading consists of a higher number over a lower number. Ideally, your blood pressure should be below 120/80. The first ("top") number is called the systolic pressure. It is a measure of the pressure in your arteries as your heart beats. The second ("bottom") number is called the diastolic pressure. It is a measure of the pressure in your arteries as the heart relaxes. What are the causes? The exact cause of this condition is not known. There are some conditions that result in or are related to high blood pressure. What increases the risk? Some risk factors for high blood pressure are under your control. The following factors may make you more likely to develop this condition: Smoking. Having type 2 diabetes mellitus, high cholesterol, or  both. Not getting enough exercise or physical activity. Being overweight. Having too much fat, sugar, calories, or  salt (sodium) in your diet. Drinking too much alcohol. Some risk factors for high blood pressure may be difficult or impossible to change. Some of these factors include: Having chronic kidney disease. Having a family history of high blood pressure. Age. Risk increases with age. Race. You may be at higher risk if you are African American. Gender. Men are at higher risk than women before age 77. After age 23, women are at higher risk than men. Having obstructive sleep apnea. Stress. What are the signs or symptoms? High blood pressure may not cause symptoms. Very high blood pressure (hypertensive crisis) may cause: Headache. Anxiety. Shortness of breath. Nosebleed. Nausea and vomiting. Vision changes. Severe chest pain. Seizures. How is this diagnosed? This condition is diagnosed by measuring your blood pressure while you are seated, with your arm resting on a flat surface, your legs uncrossed, and your feet flat on the floor. The cuff of the blood pressure monitor will be placed directly against the skin of your upper arm at the level of your heart. It should be measured at least twice using the same arm. Certain conditions can cause a difference in blood pressure between your right and left arms. Certain factors can cause blood pressure readings to be lower or higher than normal for a short period of time: When your blood pressure is higher when you are in a health care provider's office than when you are at home, this is called white coat hypertension. Most people with this condition do not need medicines. When your blood pressure is higher at home than when you are in a health care provider's office, this is called masked hypertension. Most people with this condition may need medicines to control blood pressure. If you have a high blood pressure reading during one visit or  you have normal blood pressure with other risk factors, you may be asked to: Return on a different day to have your blood pressure checked again. Monitor your blood pressure at home for 1 week or longer. If you are diagnosed with hypertension, you may have other blood or imaging tests to help your health care provider understand your overall risk for other conditions. How is this treated? This condition is treated by making healthy lifestyle changes, such as eating healthy foods, exercising more, and reducing your alcohol intake. Your health care provider may prescribe medicine if lifestyle changes are not enough to get your blood pressure under control, and if: Your systolic blood pressure is above 130. Your diastolic blood pressure is above 80. Your personal target blood pressure may vary depending on your medical conditions, your age, and other factors. Follow these instructions at home: Eating and drinking  Eat a diet that is high in fiber and potassium, and low in sodium, added sugar, and fat. An example eating plan is called the DASH (Dietary Approaches to Stop Hypertension) diet. To eat this way: Eat plenty of fresh fruits and vegetables. Try to fill one half of your plate at each meal with fruits and vegetables. Eat whole grains, such as whole-wheat pasta, brown rice, or whole-grain bread. Fill about one fourth of your plate with whole grains. Eat or drink low-fat dairy products, such as skim milk or low-fat yogurt. Avoid fatty cuts of meat, processed or cured meats, and poultry with skin. Fill about one fourth of your plate with lean proteins, such as fish, chicken without skin, beans, eggs, or tofu. Avoid pre-made and processed foods. These tend to be higher in sodium, added sugar, and  fat. Reduce your daily sodium intake. Most people with hypertension should eat less than 1,500 mg of sodium a day. Do not drink alcohol if: Your health care provider tells you not to drink. You are  pregnant, may be pregnant, or are planning to become pregnant. If you drink alcohol: Limit how much you use to: 0-1 drink a day for women. 0-2 drinks a day for men. Be aware of how much alcohol is in your drink. In the [place] one drink equals one 12 oz bottle of beer (355 mL), one 5 oz glass of wine (148 mL), or one 1 oz glass of hard liquor (44 mL). Lifestyle  Work with your health care provider to maintain a healthy body weight or to lose weight. Ask what an ideal weight is for you. Get at least 30 minutes of exercise most days of the week. Activities may include walking, swimming, or biking. Include exercise to strengthen your muscles (resistance exercise), such as Pilates or lifting weights, as part of your weekly exercise routine. Try to do these types of exercises for 30 minutes at least 3 days a week. Do not use any products that contain nicotine or tobacco, such as cigarettes, e-cigarettes, and chewing tobacco. If you need help quitting, ask your health care provider. Monitor your blood pressure at home as told by your health care provider. Keep all follow-up visits as told by your health care provider. This is important. Medicines Take over-the-counter and prescription medicines only as told by your health care provider. Follow directions carefully. Blood pressure medicines must be taken as prescribed. Do not skip doses of blood pressure medicine. Doing this puts you at risk for problems and can make the medicine less effective. Ask your health care provider about side effects or reactions to medicines that you should watch for. Contact a health care provider if you: Think you are having a reaction to a medicine you are taking. Have headaches that keep coming back (recurring). Feel dizzy. Have swelling in your ankles. Have trouble with your vision. Get help right away if you: Develop a severe headache or confusion. Have unusual weakness or numbness. Feel faint. Have severe pain  in your chest or abdomen. Vomit repeatedly. Have trouble breathing. Summary Hypertension is when the force of blood pumping through your arteries is too strong. If this condition is not controlled, it may put you at risk for serious complications. Your personal target blood pressure may vary depending on your medical conditions, your age, and other factors. For most people, a normal blood pressure is less than 120/80. Hypertension is treated with lifestyle changes, medicines, or a combination of both. Lifestyle changes include losing weight, eating a healthy, low-sodium diet, exercising more, and limiting alcohol. This information is not intended to replace advice given to you by your health care provider. Make sure you discuss any questions you have with your health care provider. Document Revised: 10/02/2017 Document Reviewed: 10/02/2017 Elsevier Patient Education  2022 [place], [place] [facility]

## 2021-08-14 ENCOUNTER — Encounter (HOSPITAL_COMMUNITY): Payer: Self-pay

## 2021-08-14 ENCOUNTER — Ambulatory Visit (HOSPITAL_COMMUNITY)
Admission: EM | Admit: 2021-08-14 | Discharge: 2021-08-14 | Disposition: A | Discharge status: Home / Self Care | Payer: Medicaid Other | Attending: Physician Assistant | Admitting: Physician Assistant

## 2021-08-14 DIAGNOSIS — I10 Essential (primary) hypertension: Secondary | ICD-10-CM | POA: Diagnosis present

## 2021-08-14 DIAGNOSIS — J029 Acute pharyngitis, unspecified: Secondary | ICD-10-CM | POA: Insufficient documentation

## 2021-08-14 DIAGNOSIS — R0989 Other specified symptoms and signs involving the circulatory and respiratory systems: Secondary | ICD-10-CM | POA: Diagnosis not present

## 2021-08-14 LAB — POCT RAPID STREP A, ED / UC: Streptococcus, Group A Screen (Direct): NEGATIVE

## 2021-08-14 MED ORDER — AMLODIPINE BESYLATE 10 MG PO TABS: 10.0000 mg | ORAL_TABLET | Freq: Every day | ORAL | 0 refills | Status: DC

## 2021-08-14 MED ORDER — LOSARTAN POTASSIUM 50 MG PO TABS: ORAL_TABLET | ORAL | 0 refills | Status: DC

## 2021-08-14 NOTE — Discharge Instructions (Signed)
Your strep was negative.  We will send this off for culture and if you need to start an antibiotic we will contact you.  I was not able to see anything on exam.  I do recommend that you follow-up with ENT for further evaluation; call to schedule appointment as soon as possible.  If you have difficulty swallowing, fever, change in your voice you should be seen immediately.  Your blood pressure is very elevated.  I refilled both of your blood pressure medications.  Please monitor this at home.  Avoid decongestants, caffeine, sodium, NSAIDs (aspirin, ibuprofen/Advil, naproxen/Aleve).  Follow-up with your primary care provider within a few days.  If you are unable to see them please return here.  If you have any headache, chest pain, shortness of breath, weakness, dizziness, visual change you need to go to the emergency room immediately.

## 2021-08-14 NOTE — ED Triage Notes (Signed)
C/o of tooth brush brussels in her throat x 2 days. She does not have any feeling on his left side, but feels something is in her throat.

## 2021-08-14 NOTE — ED Provider Notes (Signed)
[name]: [id number] Arrival date & time: 08/14/21  0807      History   Chief Complaint Chief Complaint  Patient presents with   Sore Throat    HPI Barbara Strickland is a 61 y.o. female.   Patient presents today accompanied by her daughter who provide the majority of history and translation.  Reports that approximately 3 days ago she was brushing her teeth when she felt like a bristle got stuck on her left tonsil.  She has chronic numbness and discomfort on the side of her body related to a previous stroke.  She has tried gargling with warm salt water as well as rinsing her mouth without improvement of symptoms.  She is able to eat and drink without difficulty and denies any dysphagia or odynophagia.  She denies any fever, chest pain, shortness of breath.  She has not seen an ENT.  She is concerned that she may be developing an infection as discomfort has increased since symptom onset.  Patient's blood pressure was noted to be very elevated today.  Daughter reports that she did take her blood pressure medication but only has one available.  She is not sure which of the 2 she is prescribed (amlodipine or losartan) she has available but has only been taking 1.  She was seen by her primary care and February at which point metabolic panel obtained showed normal kidney function and electrolytes.  She denies any chest pain, shortness of breath, headache, dizziness, vision change.  She is not monitoring her blood pressure at home.    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   History of kidney stones    Hx of adenomatous colonic polyps 08/05/2017   Hypertension    Illiterate    pt cannot read/write english, daughter chien h signs consents   Left spastic hemiparesis ([place])    uses cane   Microcytic anemia     Patient Active Problem List   Diagnosis Date Noted   Prediabetes 03/14/2021    History of stroke 09/05/2020   History of kidney stones 09/05/2020   Ureterolithiasis    Kidney stone on right side 01/03/2020   Essential hypertension 01/03/2020   Hydronephrosis 01/03/2020   Prolonged QT interval 01/03/2020   Dyslipidemia 11/03/2019   Hx of adenomatous colonic polyps 08/05/2017   Left spastic hemiparesis ([place]) 05/15/2017   Hemorrhagic stroke ([place]) 06/21/2015   Essential hypertension 06/21/2015    Past Surgical History:  Procedure Laterality Date   CYSTOSCOPY W/ URETERAL STENT PLACEMENT Right 01/03/2020   Procedure: CYSTOSCOPY WITH RETROGRADE PYELOGRAM/URETERAL STENT PLACEMENT;  Surgeon: Ceasar Mons, MD;  Location: [facility];  Service: Urology;  Laterality: Right;   CYSTOSCOPY/RETROGRADE/URETEROSCOPY/STONE EXTRACTION WITH BASKET Right 05/04/2020   Procedure: CYSTOSCOPY/RETROGRADE/URETEROSCOPY/ HOLMIUM LASER LITHOTRIPSY/STONE EXTRACTION WITH BASKET/ STENT EXCHANGE;  Surgeon: Ceasar Mons, MD;  Location: [facility];  Service: Urology;  Laterality: Right;   CYSTOSCOPY/URETEROSCOPY/HOLMIUM LASER/STENT PLACEMENT Right 03/09/2020   Procedure: CYSTOSCOPY/RETROGRADE/URETEROSCOPY/HOLMIUM LASER/STENT REPLACEMENT;  Surgeon: Ceasar Mons, MD;  Location: [facility];  Service: Urology;  Laterality: Right;   CYSTOSCOPY/URETEROSCOPY/HOLMIUM LASER/STENT PLACEMENT Right 03/23/2020   Procedure: CYSTOSCOPY/STENT REMOVAL, URETEROSCOPY/HOLMIUM LASER/STENT PLACEMENT;  Surgeon: Ceasar Mons, MD;  Location: [facility];  Service: Urology;  Laterality: Right;   HOLMIUM LASER APPLICATION Right 3/41/9379   Procedure: HOLMIUM LASER APPLICATION;  Surgeon: Ceasar Mons, MD;  Location: [facility];  Service: Urology;  Laterality: Right;    OB History   No obstetric history on file.      Home Medications    Prior to Admission medications   Medication Sig Start Date End  Date Taking? Authorizing Provider  amLODipine (NORVASC) 10 MG tablet Take 1 tablet (10 mg total) by mouth daily. 08/14/21   Raspet, Derry Skill, PA-C  atorvastatin (LIPITOR) 40 MG tablet TAKE 1 TABLET(40 MG) BY MOUTH DAILY 10/11/20   Horald Pollen, MD  HYDROcodone-acetaminophen [facility]) 5-325 MG tablet Take 1 tablet by mouth every 4 (four) hours as needed for moderate pain. 03/09/20   Ceasar Mons, MD  losartan (COZAAR) 50 MG tablet TAKE 1 TABLET(50 MG) BY MOUTH DAILY 08/14/21   Raspet, Derry Skill, PA-C    Family History Family History  Problem Relation Age of Onset   Hypertension Neg Hx    Hyperlipidemia Neg Hx    Colon cancer Neg Hx    Colon polyps Neg Hx    Esophageal cancer Neg Hx    Rectal cancer Neg Hx    Stomach cancer Neg Hx    Breast cancer Neg Hx    BRCA 1/2 Neg Hx     Social History Social History   Tobacco Use   Smoking status: Never   Smokeless tobacco: Never  Vaping Use   Vaping Use: Never used  Substance Use Topics   Alcohol use: Never   Drug use: Never     Allergies   Patient has no known allergies.   Review of Systems Review of Systems  Constitutional:  Positive for activity change. Negative for appetite change, fatigue and fever.  HENT:  Positive for sore throat. Negative for congestion, sinus pressure, sneezing, trouble swallowing and voice change.   Eyes:  Negative for visual disturbance.  Respiratory:  Negative for cough and shortness of breath.   Cardiovascular:  Negative for chest pain and palpitations.  Gastrointestinal:  Negative for abdominal pain, diarrhea, nausea and vomiting.  Neurological:  Negative for dizziness, syncope, speech difficulty, weakness, light-headedness, numbness and headaches.     Physical Exam Triage Vital Signs ED Triage Vitals [08/14/21 0831]  Enc Vitals Group     BP (!) 190/109     Pulse Rate (!) 59     Resp 16     Temp 98.1 F (36.7 C)     Temp Source Oral     SpO2 97 %     Weight      Height       Head Circumference      Peak Flow      Pain Score      Pain Loc      Pain Edu?      Excl. in [place]?    No data found.  Updated Vital Signs BP (!) 190/109 (BP Location: Left Arm) Comment: 171/114  Pulse (!) 59   Temp 98.1 F (36.7 C) (Oral)   Resp 16   SpO2 100%   Visual Acuity Right Eye Distance:   Left Eye Distance:   Bilateral Distance:    Right Eye Near:   Left Eye Near:    Bilateral Near:     Physical Exam Vitals reviewed.  Constitutional:      General: She is awake. She is not in acute distress.    Appearance: Normal appearance. She is well-developed. She is not ill-appearing.     Comments: Very pleasant female appears stated age in no  acute distress sitting comfortably in exam room  HENT:     Head: Normocephalic and atraumatic.     Right Ear: Tympanic membrane, ear canal and external ear normal. Tympanic membrane is not erythematous or bulging.     Left Ear: Tympanic membrane, ear canal and external ear normal. Tympanic membrane is not erythematous or bulging.     Nose:     Right Sinus: No maxillary sinus tenderness or frontal sinus tenderness.     Left Sinus: No maxillary sinus tenderness or frontal sinus tenderness.     Mouth/Throat:     Pharynx: Uvula midline. Posterior oropharyngeal erythema present. No oropharyngeal exudate.     Tonsils: No tonsillar exudate or tonsillar abscesses. 1+ on the right. 1+ on the left.     Comments: Erythema noted posterior oropharynx without exudate.  No foreign body noted in posterior oropharynx. Cardiovascular:     Rate and Rhythm: Normal rate and regular rhythm.     Heart sounds: Normal heart sounds, S1 normal and S2 normal. No murmur heard. Pulmonary:     Effort: Pulmonary effort is normal.     Breath sounds: Normal breath sounds. No wheezing, rhonchi or rales.     Comments: Clear to auscultation bilaterally Psychiatric:        Behavior: Behavior is cooperative.      UC Treatments / Results  Labs (all labs ordered are  listed, but only abnormal results are displayed) Labs Reviewed  CULTURE, GROUP A STREP [facility])  POCT RAPID STREP A, ED / UC    EKG   Radiology No results found.  Procedures Procedures (including critical care time)  Medications Ordered in UC Medications - No data to display  Initial Impression / Assessment and Plan / UC Course  I have reviewed the triage vital signs and the nursing notes.  Pertinent labs & imaging results that were available during my care of the patient were reviewed by me and considered in my medical decision making (see chart for details).     Unable to visualize any foreign body on exam.  No indication for plain films as toothbrush bristle is unlikely to be radiopaque and this would not change management.  Denies any shortness of breath, dysphagia, odynophagia and she is eating and drinking normally.  Recommended outpatient follow-up with ENT as soon as possible; she is to call to schedule an appointment.  Encouraged her to gargle with warm salt water regularly to help with symptoms.  If she develops any worsening symptoms including fever, increased pain, dysphagia, swelling of her throat, shortness of breath she needs to be seen immediately to which patient and daughter expressed understanding.  Blood pressure is very elevated today.  Patient denies any signs/symptoms of endorgan damage.  Discussed the importance of adequate blood pressure control particularly given history.  Daughter reports that she has been without one of her blood pressure medications but cannot member which 1.  Refills of both medicines were sent to pharmacy as previously prescribed.  She is to avoid NSAIDs, decongestants, caffeine, sodium.  Recommended follow-up with her primary care provider within a few days if she cannot see them for blood pressure recheck she is to return here.  Discussed that if she develops any chest pain, shortness of breath, headache, vision change, dizziness, weakness in  setting of high blood pressure she needs to go to the emergency room to which patient and daughter expressed understanding.  Patient discussed with Dr. Windy Carina who agreed with treatment plan.  Final Clinical  Impressions(s) / UC Diagnoses   Final diagnoses:  Foreign body sensation in throat  Sore throat  Elevated blood pressure reading in office with diagnosis of hypertension     Discharge Instructions      Your strep was negative.  We will send this off for culture and if you need to start an antibiotic we will contact you.  I was not able to see anything on exam.  I do recommend that you follow-up with ENT for further evaluation; call to schedule appointment as soon as possible.  If you have difficulty swallowing, fever, change in your voice you should be seen immediately.  Your blood pressure is very elevated.  I refilled both of your blood pressure medications.  Please monitor this at home.  Avoid decongestants, caffeine, sodium, NSAIDs (aspirin, ibuprofen/Advil, naproxen/Aleve).  Follow-up with your primary care provider within a few days.  If you are unable to see them please return here.  If you have any headache, chest pain, shortness of breath, weakness, dizziness, visual change you need to go to the emergency room immediately.     ED Prescriptions     Medication Sig Dispense Auth. Provider   amLODipine (NORVASC) 10 MG tablet Take 1 tablet (10 mg total) by mouth daily. 90 tablet Raspet, Erin K, PA-C   losartan (COZAAR) 50 MG tablet TAKE 1 TABLET(50 MG) BY MOUTH DAILY 90 tablet Raspet, Erin K, PA-C      PDMP not reviewed this encounter.   Terrilee Croak, PA-C 08/14/21 8616

## 2021-08-16 LAB — CULTURE, GROUP A STREP (THRC)

## 2021-08-18 ENCOUNTER — Encounter: Payer: Self-pay | Admitting: *Deleted

## 2021-08-18 ENCOUNTER — Ambulatory Visit (INDEPENDENT_AMBULATORY_CARE_PROVIDER_SITE_OTHER): Payer: Medicaid Other | Admitting: Physician Assistant

## 2021-08-18 VITALS — BP 132/89 | HR 67 | Ht 59.0 in | Wt 131.0 lb

## 2021-08-18 DIAGNOSIS — R070 Pain in throat: Secondary | ICD-10-CM | POA: Diagnosis not present

## 2021-08-18 DIAGNOSIS — Z86010 Personal history of colonic polyps: Secondary | ICD-10-CM | POA: Diagnosis not present

## 2021-08-18 NOTE — Addendum Note (Signed)
Addended by: Annabell Howells on: 08/18/2021 11:11 AM   Modules accepted: Orders

## 2021-08-18 NOTE — Progress Notes (Signed)
Chief Complaint: "Tooth bristle stuck in my throat"  HPI:    Barbara Strickland is a 61 year old [place] female, known to Dr. Carlean Purl, who was referred to me by Horald Pollen, * for a complaint of "tooth bristle stuck in my throat".    07/23/2017 colonoscopy with 1 15 mm polyp in the sigmoid colon, diminutive polyp in the descending colon otherwise normal.  Pathology showed adenomas and repeat recommended in 3 years.    08/14/2021 patient seen in the urgent care and described that she was brushing her teeth 3 days prior and felt a bristle get stuck in her left tonsil.  Discussed chronic numbness and discomfort on that side of her body related to her previous stroke.  She was able to eat but had continued discomfort.  They noted posterior oropharyngeal erythema.  No foreign body noted.  They recommended follow-up with ENT.    Today, patient is seen with her daughter who assists in translation.  Discusses that she was brushing her teeth about a week ago now and felt like one of the bristles came off and got stuck on the left side of her throat.  Ever since then has had some pain in this area.  She was seen in the urgent care as above and they were told to call the ENT and make an appointment and her daughter called here.  No fever or chills and patient is eating okay.    They are aware the patient is due for repeat colonoscopy.    Denies any other GI complaints or concerns, no fever, chills, abdominal pain, weight loss or symptoms that awaken her from sleep.  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[facility])    Hemorrhagic stroke ([place]) 2009   History of kidney stones    Hx of adenomatous colonic polyps 08/05/2017   Hypertension    Illiterate    pt cannot read/write english, daughter chien h signs consents   Left spastic hemiparesis ([facility])    uses cane   Microcytic anemia    Tubular adenoma of colon     Past Surgical History:  Procedure  Laterality Date   CYSTOSCOPY W/ URETERAL STENT PLACEMENT Right 01/03/2020   Procedure: CYSTOSCOPY WITH RETROGRADE PYELOGRAM/URETERAL STENT PLACEMENT;  Surgeon: Ceasar Mons, MD;  [facility];  Service: Urology;  Laterality: Right;   CYSTOSCOPY/RETROGRADE/URETEROSCOPY/STONE EXTRACTION WITH BASKET Right 05/04/2020   Procedure: CYSTOSCOPY/RETROGRADE/URETEROSCOPY/ HOLMIUM LASER LITHOTRIPSY/STONE EXTRACTION WITH BASKET/ STENT EXCHANGE;  Surgeon: Ceasar Mons, MD;  [facility];  Service: Urology;  Laterality: Right;   CYSTOSCOPY/URETEROSCOPY/HOLMIUM LASER/STENT PLACEMENT Right 03/09/2020   Procedure: CYSTOSCOPY/RETROGRADE/URETEROSCOPY/HOLMIUM LASER/STENT REPLACEMENT;  Surgeon: Ceasar Mons, MD;  Location: [facility];  Service: Urology;  Laterality: Right;   CYSTOSCOPY/URETEROSCOPY/HOLMIUM LASER/STENT PLACEMENT Right 03/23/2020   Procedure: CYSTOSCOPY/STENT REMOVAL, URETEROSCOPY/HOLMIUM LASER/STENT PLACEMENT;  Surgeon: Ceasar Mons, MD;  Location: [facility];  Service: Urology;  Laterality: Right;   HOLMIUM LASER APPLICATION Right 9/62/9528   Procedure: HOLMIUM LASER APPLICATION;  Surgeon: Ceasar Mons, MD;  Location: [facility];  Service: Urology;  Laterality: Right;    Current Outpatient Medications  Medication Sig Dispense Refill   amLODipine (NORVASC) 10 MG tablet Take 1 tablet (10 mg total) by mouth daily. 90 tablet 0   atorvastatin (LIPITOR) 40 MG tablet TAKE 1 TABLET(40 MG) BY MOUTH DAILY 90 tablet 3   HYDROcodone-acetaminophen (NORCO) 5-325 MG tablet Take 1 tablet  by mouth every 4 (four) hours as needed for moderate pain. 20 tablet 0   losartan (COZAAR) 50 MG tablet TAKE 1 TABLET(50 MG) BY MOUTH DAILY 90 tablet 0   No current facility-administered medications for this visit.    Allergies as of 08/18/2021   (No Known Allergies)    Family History   Problem Relation Age of Onset   Hypertension Neg Hx    Hyperlipidemia Neg Hx    Colon cancer Neg Hx    Colon polyps Neg Hx    Esophageal cancer Neg Hx    Rectal cancer Neg Hx    Stomach cancer Neg Hx    Breast cancer Neg Hx    BRCA 1/2 Neg Hx     Social History   Socioeconomic History   Marital status: Married    Spouse name: Not on file   Number of children: 4   Years of education: Not on file   Highest education level: Not on file  Occupational History   Not on file  Tobacco Use   Smoking status: Never   Smokeless tobacco: Never  Vaping Use   Vaping Use: Never used  Substance and Sexual Activity   Alcohol use: Never   Drug use: Never   Sexual activity: Not on file  Other Topics Concern   Not on file  Social History Narrative   ** Merged History Encounter **       Social Determinants of Health   Financial Resource Strain: Not on file  Food Insecurity: Not on file  Transportation Needs: Not on file  Physical Activity: Not on file  Stress: Not on file  Social Connections: Not on file  Intimate Partner Violence: Not on file    Review of Systems:    Constitutional: No weight loss, fever or chills Skin: No rash Cardiovascular: No chest pain Respiratory: No SOB Gastrointestinal: See HPI and otherwise negative Genitourinary: No dysuria  Neurological: No headache, dizziness or syncope Musculoskeletal: No new muscle or joint pain Hematologic: No bleeding  Psychiatric: No history of depression or anxiety   Physical Exam:  Vital signs: BP 132/89   Pulse 67   Ht '4\' 11"'  (1.499 m)   Wt 131 lb (59.4 kg)   BMI 26.46 kg/m    Constitutional:   Pleasant [place] female appears to be in NAD, Well developed, Well nourished, alert and cooperative Head:  Normocephalic and atraumatic. Eyes:   PEERL, EOMI. No icterus. Conjunctiva pink. Ears:  Normal auditory acuity. Neck:  Supple Throat: Oral cavity without inflammation or swelling.  Could not visualize tonsils  as patient was unable to move her tongue to the correct position and we did not have a tongue depressor, does have some tenderness to the outside of her tonsils on the left side Respiratory: Respirations even and unlabored. Lungs clear to auscultation bilaterally.   No wheezes, crackles, or rhonchi.  Cardiovascular: Normal S1, S2. No MRG. Regular rate and rhythm. No peripheral edema, cyanosis or pallor.  Gastrointestinal:  Soft, nondistended, nontender. No rebound or guarding. Normal bowel sounds. No appreciable masses or hepatomegaly. Rectal:  Not performed.  Msk:  Symmetrical without gross deformities. Without edema, no deformity or joint abnormality.  Neurologic:  Alert and  oriented x4;  grossly normal neurologically.  Skin:   Dry and intact without significant lesions or rashes. Psychiatric: Demonstrates good judgement and reason without abnormal affect or behaviors.  RELEVANT LABS AND IMAGING: CBC    Component Value Date/Time   WBC 8.5 01/05/2020 0529  RBC 4.99 01/05/2020 0529   HGB 12.2 05/04/2020 0630   HGB 11.6 10/03/2017 0944   HCT 36.0 05/04/2020 0630   HCT 39.2 10/03/2017 0944   PLT 125 (L) 01/05/2020 0529   PLT 187 10/03/2017 0944   MCV 68.3 (L) 01/05/2020 0529   MCV 67 (L) 10/03/2017 0944   MCH 20.6 (L) 01/05/2020 0529   MCHC 30.2 01/05/2020 0529   RDW 16.1 (H) 01/05/2020 0529   RDW 17.0 (H) 10/03/2017 0944   LYMPHSABS 2.2 01/05/2020 0529   LYMPHSABS 2.2 08/01/2016 0857   MONOABS 0.6 01/05/2020 0529   EOSABS 0.0 01/05/2020 0529   EOSABS 0.1 08/01/2016 0857   BASOSABS 0.0 01/05/2020 0529   BASOSABS 0.0 08/01/2016 0857    CMP     Component Value Date/Time   NA 141 03/14/2021 0921   NA 143 03/07/2020 1140   K 3.8 03/14/2021 0921   CL 106 03/14/2021 0921   CO2 32 03/14/2021 0921   GLUCOSE 86 03/14/2021 0921   BUN 13 03/14/2021 0921   BUN 18 03/07/2020 1140   CREATININE 0.99 03/14/2021 0921   CREATININE 0.83 06/21/2015 0905   CALCIUM 8.9 03/14/2021 0921    PROT 7.6 03/14/2021 0921   PROT 7.6 03/07/2020 1140   ALBUMIN 4.0 03/14/2021 0921   ALBUMIN 4.6 03/07/2020 1140   AST 19 03/14/2021 0921   ALT 11 03/14/2021 0921   ALKPHOS 43 03/14/2021 0921   BILITOT 0.7 03/14/2021 0921   BILITOT 0.9 03/07/2020 1140   GFRNONAA 55 (L) 03/07/2020 1140   GFRNONAA >60 01/05/2020 0529   GFRNONAA 79 06/21/2015 0905   GFRAA 64 03/07/2020 1140   GFRAA >89 06/21/2015 0905   Assessment: 1.  Left-sided throat pain: Patient feels she got a toothbrush bristle stuck in her throat, cannot visualize her tonsils on exam today 2.  History of adenomatous polyps: Last colonoscopy in 2019 with repeat recommended in 3 years, patient is overdue  Plan: 1.  Sent urgent referral to ENT for possible foreign body and left-sided throat pain.  Staff also called to various ENT offices to see if we can get her in sooner.  They are working on an appointment for her.  Patient was also given information in regards to what number to call for an appointment. 2.  Patient scheduled for surveillance colonoscopy in the [place] with Dr. Carlean Purl.  Did provide the patient a detailed list of risks for the procedure and she agrees to proceed. Patient is appropriate for endoscopic procedure(s) in the ambulatory ([place]) setting.  3.  Patient to follow in clinic with [place] per recommendations after time of colonoscopy.  Ellouise Newer, PA-C [facility] 08/18/2021, 10:20 AM  Cc: Horald Pollen, *

## 2021-08-18 NOTE — Patient Instructions (Addendum)
You have been scheduled for an appointment at [facility] at 4:15 pm. Please contact them at [phone] should you have questions. Their office is located at [address], [place], [place] [address]  You have been scheduled for a colonoscopy. Please follow written instructions given to you at your visit today.  Please pick up your prep supplies at the pharmacy within the next 1-3 days. If you use inhalers (even only as needed), please bring them with you on the day of your procedure.   ________________________________________________________  If you are age 50 or older, your body mass index should be between 23-30. Your Body mass index is 26.46 kg/m. If this is out of the aforementioned range listed, please consider follow up with your Primary Care Provider.  If you are age 92 or younger, your body mass index should be between 19-25. Your Body mass index is 26.46 kg/m. If this is out of the aformentioned range listed, please consider follow up with your Primary Care Provider.   ________________________________________________________  The [facility] providers would like to encourage you to use [facility] to communicate with providers for non-urgent requests or questions.  Due to long hold times on the telephone, sending your provider a message by [facility] may be a faster and more efficient way to get a response.  Please allow 48 business hours for a response.  Please remember that this is for non-urgent requests.  _______________________________________________________

## 2021-09-05 ENCOUNTER — Encounter: Payer: Self-pay | Admitting: Internal Medicine

## 2021-09-05 ENCOUNTER — Other Ambulatory Visit: Payer: Self-pay | Admitting: Internal Medicine

## 2021-09-05 ENCOUNTER — Ambulatory Visit (AMBULATORY_SURGERY_CENTER): Payer: Medicaid Other | Admitting: Internal Medicine

## 2021-09-05 VITALS — BP 149/69 | HR 58 | Temp 97.8°F | Resp 18 | Ht 59.0 in | Wt 131.0 lb

## 2021-09-05 DIAGNOSIS — D123 Benign neoplasm of transverse colon: Secondary | ICD-10-CM

## 2021-09-05 DIAGNOSIS — Z86010 Personal history of colonic polyps: Secondary | ICD-10-CM

## 2021-09-05 DIAGNOSIS — Z09 Encounter for follow-up examination after completed treatment for conditions other than malignant neoplasm: Secondary | ICD-10-CM | POA: Diagnosis not present

## 2021-09-05 DIAGNOSIS — D125 Benign neoplasm of sigmoid colon: Secondary | ICD-10-CM | POA: Diagnosis not present

## 2021-09-05 MED ORDER — SODIUM CHLORIDE 0.9 % IV SOLN: 500.0000 mL | Freq: Once | INTRAVENOUS | Status: DC

## 2021-09-05 NOTE — Progress Notes (Signed)
[facility] History and Physical   Primary Care Physician:  Horald Pollen, MD   Reason for Procedure:   Hx colon polyps  Plan:    colonoscopy     HPI: Barbara Strickland is a 61 y.o. 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[place])    uses cane   Microcytic anemia    Tubular adenoma of colon     Past Surgical History:  Procedure Laterality Date   CYSTOSCOPY W/ URETERAL STENT PLACEMENT Right 01/03/2020   Procedure: CYSTOSCOPY WITH RETROGRADE PYELOGRAM/URETERAL STENT PLACEMENT;  Surgeon: Ceasar Mons, MD;  Location: [facility];  Service: Urology;  Laterality: Right;   CYSTOSCOPY/RETROGRADE/URETEROSCOPY/STONE EXTRACTION WITH BASKET Right 05/04/2020   Procedure: CYSTOSCOPY/RETROGRADE/URETEROSCOPY/ HOLMIUM LASER LITHOTRIPSY/STONE EXTRACTION WITH BASKET/ STENT EXCHANGE;  Surgeon: Ceasar Mons, MD;  Location: [facility];  Service: Urology;  Laterality: Right;   CYSTOSCOPY/URETEROSCOPY/HOLMIUM LASER/STENT PLACEMENT Right 03/09/2020   Procedure: CYSTOSCOPY/RETROGRADE/URETEROSCOPY/HOLMIUM LASER/STENT REPLACEMENT;  Surgeon: Ceasar Mons, MD;  Location: [facility];  Service: Urology;  Laterality: Right;   CYSTOSCOPY/URETEROSCOPY/HOLMIUM LASER/STENT PLACEMENT Right 03/23/2020   Procedure: CYSTOSCOPY/STENT REMOVAL, URETEROSCOPY/HOLMIUM LASER/STENT PLACEMENT;  Surgeon: Ceasar Mons, MD;  Location: [facility];  Service: Urology;  Laterality: Right;   HOLMIUM  LASER APPLICATION Right 3/49/1791   Procedure: HOLMIUM LASER APPLICATION;  Surgeon: Ceasar Mons, MD;  Location: [facility];  Service: Urology;  Laterality: Right;    Prior to Admission medications   Medication Sig Start Date End Date Taking? Authorizing Provider  amLODipine (NORVASC) 10 MG tablet Take 1 tablet (10 mg total) by mouth daily. 08/14/21  Yes Raspet, Erin K, PA-C  atorvastatin (LIPITOR) 40 MG tablet TAKE 1 TABLET(40 MG) BY MOUTH DAILY 10/11/20  Yes Sagardia, Ines Bloomer, MD  losartan (COZAAR) 50 MG tablet TAKE 1 TABLET(50 MG) BY MOUTH DAILY 08/14/21  Yes Raspet, Erin K, PA-C  HYDROcodone-acetaminophen (NORCO) 5-325 MG tablet Take 1 tablet by mouth every 4 (four) hours as needed for moderate pain. Patient not taking: Reported on 08/18/2021 03/09/20   Ceasar Mons, MD    Current Outpatient Medications  Medication Sig Dispense Refill   amLODipine (NORVASC) 10 MG tablet Take 1 tablet (10 mg total) by mouth daily. 90 tablet 0   atorvastatin (LIPITOR) 40 MG tablet TAKE 1 TABLET(40 MG) BY MOUTH DAILY 90 tablet 3   losartan (COZAAR) 50 MG tablet TAKE 1 TABLET(50 MG) BY MOUTH DAILY 90 tablet 0   HYDROcodone-acetaminophen (NORCO) 5-325 MG tablet Take 1 tablet by mouth every 4 (four) hours as needed for moderate pain. (Patient not taking: Reported on 08/18/2021) 20 tablet 0   Current Facility-Administered Medications  Medication Dose Route Frequency Provider Last Rate Last Admin   0.9 %  sodium chloride infusion  500 mL Intravenous Once Gatha Mayer, MD        Allergies as of 09/05/2021   (No Known Allergies)    Family History  Problem Relation Age of Onset  Hypertension Neg Hx    Hyperlipidemia Neg Hx    Colon cancer Neg Hx    Colon polyps Neg Hx    Esophageal cancer Neg Hx    Rectal cancer Neg Hx    Stomach cancer Neg Hx    Breast cancer Neg Hx    BRCA 1/2 Neg Hx    Diabetes Neg Hx     Social History   Socioeconomic History    Marital status: Married    Spouse name: Not on file   Number of children: 4   Years of education: Not on file   Highest education level: Not on file  Occupational History   Not on file  Tobacco Use   Smoking status: Never   Smokeless tobacco: Never  Vaping Use   Vaping Use: Never used  Substance and Sexual Activity   Alcohol use: Never   Drug use: Never   Sexual activity: Not on file  Other Topics Concern   Not on file  Social History Narrative   ** Merged History Encounter **       Social Determinants of Health   Financial Resource Strain: Not on file  Food Insecurity: Not on file  Transportation Needs: Not on file  Physical Activity: Not on file  Stress: Not on file  Social Connections: Not on file  Intimate Partner Violence: Not on file    Review of Systems:  All other review of systems negative except as mentioned in the HPI.  Physical Exam: Vital signs BP 112/72   Pulse 69   Temp 97.8 F (36.6 C)   Ht '4\' 11"'  (1.499 m)   Wt 131 lb (59.4 kg)   SpO2 99%   BMI 26.46 kg/m   General:   Alert,  Well-developed, well-nourished, pleasant and cooperative in NAD Lungs:  Clear throughout to auscultation.   Heart:  Regular rate and rhythm; no murmurs, clicks, rubs,  or gallops. Abdomen:  Soft, nontender and nondistended. Normal bowel sounds.   Neuro/Psych:  Alert and cooperative. Normal mood and affect. A and O x 3   '@Sherita Decoste'  Simonne Maffucci, MD, [facility] [phone] (pager) 09/05/2021 9:07 AM@

## 2021-09-05 NOTE — Op Note (Signed)
[facility] Patient Name: Barbara Strickland Procedure Date: 09/05/2021 9:05 AM MRN: [id number] Endoscopist: Gatha Mayer , MD Age: 61 Referring MD:  Date of Birth: 11-14-1960 Gender: Female Account #: [id number] Procedure:                Colonoscopy Indications:              Surveillance: Personal history of adenomatous                            polyps on last colonoscopy > 3 years ago, Last                            colonoscopy: 2019 Medicines:                Monitored Anesthesia Care Procedure:                Pre-Anesthesia Assessment:                           - Prior to the procedure, a History and Physical                            was performed, and patient medications and                            allergies were reviewed. The patient's tolerance of                            previous anesthesia was also reviewed. The risks                            and benefits of the procedure and the sedation                            options and risks were discussed with the patient.                            All questions were answered, and informed consent                            was obtained. Prior Anticoagulants: The patient has                            taken no previous anticoagulant or antiplatelet                            agents. ASA Grade Assessment: II - A patient with                            mild systemic disease. After reviewing the risks                            and benefits, the patient was deemed in  satisfactory condition to undergo the procedure.                           After obtaining informed consent, the colonoscope                            was passed under direct vision. Throughout the                            procedure, the patient's blood pressure, pulse, and                            oxygen saturations were monitored continuously. The                            Olympus Scope [id number] was introduced through the                             anus and advanced to the the cecum, identified by                            appendiceal orifice and ileocecal valve. The                            colonoscopy was performed without difficulty. The                            patient tolerated the procedure well. The quality                            of the bowel preparation was good. The bowel                            preparation used was Miralax via split dose                            instruction. The ileocecal valve, appendiceal                            orifice, and rectum were photographed. Scope In: 9:17:03 AM Scope Out: 9:36:05 AM Scope Withdrawal Time: 0 hours 16 minutes 0 seconds  Total Procedure Duration: 0 hours 19 minutes 2 seconds  Findings:                 The perianal and digital rectal examinations were                            normal.                           Two sessile polyps were found in the sigmoid colon                            and transverse colon. The polyps were diminutive in  size. These polyps were removed with a cold snare.                            Resection and retrieval were complete. Verification                            of patient identification for the specimen was                            done. Estimated blood loss was minimal.                           The exam was otherwise without abnormality on                            direct and retroflexion views. Complications:            No immediate complications. Estimated Blood Loss:     Estimated blood loss was minimal. Impression:               - Two diminutive polyps in the sigmoid colon and in                            the transverse colon, removed with a cold snare.                            Resected and retrieved.                           - The examination was otherwise normal on direct                            and retroflexion views.                           - Personal history of  colonic polyps. 2 adenomas                            2019 max 15 mm Recommendation:           - Patient has a contact number available for                            emergencies. The signs and symptoms of potential                            delayed complications were discussed with the                            patient. Return to normal activities tomorrow.                            Written discharge instructions were provided to the  patient.                           - Resume previous diet.                           - Continue present medications.                           - Await pathology results.                           - Repeat colonoscopy is recommended for                            surveillance. The colonoscopy date will be                            determined after pathology results from today's                            exam become available for review. Gatha Mayer, MD 09/05/2021 9:47:48 AM This report has been signed electronically.

## 2021-09-05 NOTE — Patient Instructions (Addendum)
I found and removed 2 tiny polyps. I will let you know pathology results and when to have another routine colonoscopy by mail and/or My Chart.  I appreciate the opportunity to care for you. Gatha Mayer, MD, [facility]  Information on polyps given to you today.  Await pathology results.  Resume previous diet and medications.  YOU HAD AN ENDOSCOPIC PROCEDURE TODAY AT [facility]:   Refer to the procedure report that was given to you for any specific questions about what was found during the examination.  If the procedure report does not answer your questions, please call your gastroenterologist to clarify.  If you requested that your care partner not be given the details of your procedure findings, then the procedure report has been included in a sealed envelope for you to review at your convenience later.  YOU SHOULD EXPECT: Some feelings of bloating in the abdomen. Passage of more gas than usual.  Walking can help get rid of the air that was put into your GI tract during the procedure and reduce the bloating. If you had a lower endoscopy (such as a colonoscopy or flexible sigmoidoscopy) you may notice spotting of blood in your stool or on the toilet paper. If you underwent a bowel prep for your procedure, you may not have a normal bowel movement for a few days.  Please Note:  You might notice some irritation and congestion in your nose or some drainage.  This is from the oxygen used during your procedure.  There is no need for concern and it should clear up in a day or so.  SYMPTOMS TO REPORT IMMEDIATELY:  Following lower endoscopy (colonoscopy or flexible sigmoidoscopy):  Excessive amounts of blood in the stool  Significant tenderness or worsening of abdominal pains  Swelling of the abdomen that is new, acute  Fever of 100F or higher   For urgent or emergent issues, a gastroenterologist can be reached at any hour by calling [phone]. Do not use MyChart messaging for  urgent concerns.    DIET:  We do recommend a small meal at first, but then you may proceed to your regular diet.  Drink plenty of fluids but you should avoid alcoholic beverages for 24 hours.  ACTIVITY:  You should plan to take it easy for the rest of today and you should NOT DRIVE or use heavy machinery until tomorrow (because of the sedation medicines used during the test).    FOLLOW UP: Our staff will call the number listed on your records the next business day following your procedure.  We will call around 7:15- 8:00 am to check on you and address any questions or concerns that you may have regarding the information given to you following your procedure. If we do not reach you, we will leave a message.  If you develop any symptoms (ie: fever, flu-like symptoms, shortness of breath, cough etc.) before then, please call [phone].  If you test positive for Covid 19 in the 2 weeks post procedure, please call and report this information to [place].    If any biopsies were taken you will be contacted by phone or by letter within the next 1-3 weeks.  Please call us at [phone] if you have not heard about the biopsies in 3 weeks.    SIGNATURES/CONFIDENTIALITY: You and/or your care partner have signed paperwork which will be entered into your electronic medical record.  These signatures attest to the fact that that the information above on  your After Visit Summary has been reviewed and is understood.  Full responsibility of the confidentiality of this discharge information lies with you and/or your care-partner.

## 2021-09-05 NOTE — Progress Notes (Signed)
Called to room to assist during endoscopic procedure.  Patient ID and intended procedure confirmed with present staff. Received instructions for my participation in the procedure from the performing physician.  

## 2021-09-05 NOTE — Progress Notes (Signed)
Sedate, gd SR, tolerated procedure well, VSS, report to RN 

## 2021-09-06 ENCOUNTER — Telehealth: Payer: Self-pay | Admitting: *Deleted

## 2021-09-06 NOTE — Telephone Encounter (Signed)
  Follow up Call-     09/05/2021    8:29 AM  Call back number  Post procedure Call Back phone  # [phone]  Permission to leave phone message Yes     Patient questions:  Do you have a fever, pain , or abdominal swelling? No. Pain Score  0 *  Have you tolerated food without any problems? Yes.    Have you been able to return to your normal activities? Yes.    Do you have any questions about your discharge instructions: Diet   No. Medications  No. Follow up visit  No.  Do you have questions or concerns about your Care? No.  Actions: * If pain score is 4 or above: No action needed, pain <4.

## 2021-09-11 ENCOUNTER — Ambulatory Visit (INDEPENDENT_AMBULATORY_CARE_PROVIDER_SITE_OTHER): Payer: Medicaid Other | Admitting: Emergency Medicine

## 2021-09-11 ENCOUNTER — Encounter: Payer: Self-pay | Admitting: Internal Medicine

## 2021-09-11 ENCOUNTER — Encounter: Payer: Self-pay | Admitting: Emergency Medicine

## 2021-09-11 VITALS — BP 120/76 | HR 75 | Temp 98.5°F | Ht 59.0 in | Wt 132.5 lb

## 2021-09-11 DIAGNOSIS — R7303 Prediabetes: Secondary | ICD-10-CM

## 2021-09-11 DIAGNOSIS — Z23 Encounter for immunization: Secondary | ICD-10-CM | POA: Diagnosis not present

## 2021-09-11 DIAGNOSIS — I10 Essential (primary) hypertension: Secondary | ICD-10-CM | POA: Diagnosis not present

## 2021-09-11 DIAGNOSIS — E785 Hyperlipidemia, unspecified: Secondary | ICD-10-CM | POA: Diagnosis not present

## 2021-09-11 DIAGNOSIS — Z8673 Personal history of transient ischemic attack (TIA), and cerebral infarction without residual deficits: Secondary | ICD-10-CM | POA: Diagnosis not present

## 2021-09-11 NOTE — Assessment & Plan Note (Signed)
Well-controlled hypertension. Continue amlodipine 10 mg and losartan 50 mg daily. Diet and nutrition discussed. Follow-up in 6 months.

## 2021-09-11 NOTE — Assessment & Plan Note (Signed)
Stable.  Diet and nutrition discussed. Continue atorvastatin 40 mg daily. 

## 2021-09-11 NOTE — Assessment & Plan Note (Signed)
Diet and nutrition discussed. Advised to decrease amount of daily carbohydrate intake and daily calories.

## 2021-09-11 NOTE — Progress Notes (Signed)
Barbara Strickland 61 y.o.   Chief Complaint  Patient presents with   Follow-up    55mth f/u appt no concerns    HISTORY OF PRESENT ILLNESS: This is a 61y.o. female here for 653-monthollow-up of hypertension and dyslipidemia. 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  No falls. Here with daughter-in-law and interpreter, helping with history. Recent colonoscopy report done this year reviewed.  It showed 2 benign polyps. Has no complaints or medical concerns today. Overall doing well.  HPI   Prior to Admission medications   Medication Sig Start Date End Date Taking? Authorizing Provider  amLODipine (NORVASC) 10 MG tablet Take 1 tablet (10 mg total) by mouth daily. 08/14/21  Yes Raspet, Erin K, PA-C  atorvastatin (LIPITOR) 40 MG tablet TAKE 1 TABLET(40 MG) BY MOUTH DAILY 10/11/20  Yes Wessie Shanks, MiInes BloomerMD  losartan (COZAAR) 50 MG tablet TAKE 1 TABLET(50 MG) BY MOUTH DAILY 08/14/21  Yes Raspet, Erin K, PA-C  HYDROcodone-acetaminophen (NORCO) 5-325 MG tablet Take 1 tablet by mouth every 4 (four) hours as needed for moderate pain. Patient not taking: Reported on 08/18/2021 03/09/20   WiCeasar MonsMD    No Known Allergies  Patient Active Problem List   Diagnosis Date Noted   Prediabetes 03/14/2021   History of stroke 09/05/2020   History of kidney stones 09/05/2020   Ureterolithiasis    Kidney stone on right side 01/03/2020   Essential hypertension 01/03/2020   Hydronephrosis 01/03/2020   Prolonged QT interval 01/03/2020   Dyslipidemia 11/03/2019   Hx of adenomatous colonic polyps 08/05/2017   Left sp[place]iparesis ([facility]/11/[place]   [place]agic [place] ([place]16[place]   Essential hypertension 06/21/2015    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   Hemorrhagic stroke ([facility]   History of kidney stones    Hx of adenomatous colonic polyps 08/05/2017    Hypertension    Illiterate    pt cannot read/write english, daughter chien h signs consents   Left spastic hemiparesis ([place]   uses cane   Microcytic anemia    Tubular adenoma of colon     Past Surgical History:  Procedure Laterality Date   CYSTOSCOPY W/ URETERAL STENT PLACEMENT Right 01/03/2020   Procedure: CYSTOSCOPY WITH RETROGRADE PYELOGRAM/URETERAL STENT PLACEMENT;  Surgeon: WiCeasar MonsMD;  Location[facility];  Service: Urology;  Laterality: Right;   CYSTOSCOPY/RETROGRADE/URETEROSCOPY/STONE EXTRACTION WITH BASKET Right 05/04/2020   Procedure: CYSTOSCOPY/RETROGRADE/URETEROSCOPY/ HOLMIUM LASER LITHOTRIPSY/STONE EXTRACTION WITH BASKET/ STENT EXCHANGE;  Surgeon: WiCeasar MonsMD;  [facility];  Laterality: Right;   CYSTOSCOPY/URETEROSCOPY/HOLMIUM LASER/STENT PLACEMENT Right 03/09/2020   Procedure: CYSTOSCOPY/RETROGRADE/URETEROSCOPY/HOLMIUM LASER/STENT REPLACEMENT;  Surgeon: WiCeasar MonsMD;  [facility] Service: [facility];  Laterality: Right;   CYSTOSCOPY/URETEROSCOPY/HOLMIUM LASER/STENT PLACEMENT Right 03/23/2020   Procedure: CYSTOSCOPY/STENT REMOVAL, URETEROSCOPY/HOLMIUM LASER/STENT PLACEMENT;  Surgeon: WiCeasar MonsMD;  [facility];  Laterality: Right;   HOLMIUM LASER APPLICATION Right 3/0/15/6153 Procedure: HOLMIUM LASER APPLICATION;  Surgeon: WiCeasar MonsMD;  [facility];  Laterality: Right;    Social History   Socioeconomic History   Marital status: Married    Spouse name: Not on file   Number of children: 4   Years of education: Not on file   Highest education level: Not  on file  Occupational History   Not on file  Tobacco Use   Smoking status: Never   Smokeless tobacco: Never  Vaping Use   Vaping Use: Never used  Substance and Sexual Activity    Alcohol use: Never   Drug use: Never   Sexual activity: Not on file  Other Topics Concern   Not on file  Social History Narrative   ** Merged History Encounter **       Social Determinants of Health   Financial Resource Strain: Not on file  Food Insecurity: Not on file  Transportation Needs: Not on file  Physical Activity: Not on file  Stress: Not on file  Social Connections: Not on file  Intimate Partner Violence: Not on file    Family History  Problem Relation Age of Onset   Hypertension Neg Hx    Hyperlipidemia Neg Hx    Colon cancer Neg Hx    Colon polyps Neg Hx    Esophageal cancer Neg Hx    Rectal cancer Neg Hx    Stomach cancer Neg Hx    Breast cancer Neg Hx    BRCA 1/2 Neg Hx    Diabetes Neg Hx      Review of Systems  Constitutional: Negative.  Negative for chills and fever.  HENT: Negative.  Negative for congestion and sore throat.   Respiratory: Negative.  Negative for cough and shortness of breath.   Cardiovascular: Negative.  Negative for chest pain and palpitations.  Gastrointestinal: Negative.  Negative for abdominal pain, diarrhea, nausea and vomiting.  Genitourinary: Negative.  Negative for dysuria and hematuria.  Skin: Negative.  Negative for rash.  Neurological: Negative.  Negative for dizziness and headaches.  All other systems reviewed and are negative.   Today's Vitals   09/11/21 0844  BP: 120/76  Pulse: 75  Temp: 98.5 F (36.9 C)  TempSrc: Oral  SpO2: 97%  Weight: 132 lb 8 oz (60.1 kg)  Height: '4\' 11"'  (1.499 m)   Body mass index is 26.76 kg/m. Wt Readings from Last 3 Encounters:  09/11/21 132 lb 8 oz (60.1 kg)  09/05/21 131 lb (59.4 kg)  08/18/21 131 lb (59.4 kg)    Physical Exam Vitals reviewed.  Constitutional:      Appearance: Normal appearance.  HENT:     Head: Normocephalic.     Mouth/Throat:     Mouth: Mucous membranes are moist.     Pharynx: Oropharynx is clear.  Eyes:     Extraocular Movements: Extraocular  movements intact.     Conjunctiva/sclera: Conjunctivae normal.     Pupils: Pupils are equal, round, and reactive to light.  Cardiovascular:     Rate and Rhythm: Normal rate and regular rhythm.     Pulses: Normal pulses.     Heart sounds: Normal heart sounds.  Pulmonary:     Effort: Pulmonary effort is normal.     Breath sounds: Normal breath sounds.  Musculoskeletal:     Cervical back: No tenderness.  Lymphadenopathy:     Cervical: No cervical adenopathy.  Skin:    General: Skin is warm and dry.     Capillary Refill: Capillary refill takes less than 2 seconds.  Neurological:     Mental Status: She is alert and oriented to person, place, and time. Mental status is at baseline.     Comments: Residual left-sided spastic hemiparesis  Psychiatric:        Mood and Affect: Mood normal.  Behavior: Behavior normal.      ASSESSMENT & PLAN: A total of 46 minutes was spent with the patient and counseling/coordination of care regarding preparing for this visit, review of most recent office visit notes, review of multiple chronic medical problems and their management, review of all medications, secondary prevention of stroke, education on nutrition, review of most recent blood work results, prognosis, documentation, and need for follow-up.  Problem List Items Addressed This Visit       Cardiovascular and Mediastinum   Essential hypertension - Primary    Well-controlled hypertension. Continue amlodipine 10 mg and losartan 50 mg daily. Diet and nutrition discussed. Follow-up in 6 months.        Other   Dyslipidemia    Stable.  Diet and nutrition discussed. Continue atorvastatin 40 mg daily.      History of stroke    History of hemorrhagic stroke.  Not on anticoagulation. Secondary prevention of stroke discussed.      Prediabetes    Diet and nutrition discussed. Advised to decrease amount of daily carbohydrate intake and daily calories.      Other Visit Diagnoses      Need for vaccination       Relevant Orders   Zoster Recombinant (Shingrix )      Patient Instructions  Stroke Prevention Some medical conditions and lifestyle choices can lead to a higher risk for a stroke. You can help to prevent a stroke by eating healthy foods and exercising. It also helps to not smoke and to manage any health problems you may have. How can this condition affect me? A stroke is an emergency. It should be treated right away. A stroke can lead to brain damage or threaten your life. There is a better chance of surviving and getting better after a stroke if you get medical help right away. What can increase my risk? The following medical conditions may increase your risk of a stroke: Diseases of the heart and blood vessels (cardiovascular disease). High blood pressure (hypertension). Diabetes. High cholesterol. Sickle cell disease. Problems with blood clotting. Being very overweight. Sleeping problems (obstructivesleep apnea). Other risk factors include: Being older than age 77. A history of blood clots, stroke, or mini-stroke (TIA). Race, ethnic background, or a family history of stroke. Smoking or using tobacco products. Taking birth control pills, especially if you smoke. Heavy alcohol and drug use. Not being active. What actions can I take to prevent this? Manage your health conditions High cholesterol. Eat a healthy diet. If this is not enough to manage your cholesterol, you may need to take medicines. Take medicines as told by your doctor. High blood pressure. Try to keep your blood pressure below 130/80. If your blood pressure cannot be managed through a healthy diet and regular exercise, you may need to take medicines. Take medicines as told by your doctor. Ask your doctor if you should check your blood pressure at home. Have your blood pressure checked every year. Diabetes. Eat a healthy diet and get regular exercise. If your blood sugar (glucose)  cannot be managed through diet and exercise, you may need to take medicines. Take medicines as told by your doctor. Talk to your doctor about getting checked for sleeping problems. Signs of a problem can include: Snoring a lot. Feeling very tired. Make sure that you manage any other conditions you have. Nutrition  Follow instructions from your doctor about what to eat or drink. You may be told to: Eat and drink fewer calories each  day. Limit how much salt (sodium) you use to 1,500 milligrams (mg) each day. Use only healthy fats for cooking, such as olive oil, canola oil, and sunflower oil. Eat healthy foods. To do this: Choose foods that are high in fiber. These include whole grains, and fresh fruits and vegetables. Eat at least 5 servings of fruits and vegetables a day. Try to fill one-half of your plate with fruits and vegetables at each meal. Choose low-fat (lean) proteins. These include low-fat cuts of meat, chicken without skin, fish, tofu, beans, and nuts. Eat low-fat dairy products. Avoid foods that: Are high in salt. Have saturated fat. Have trans fat. Have cholesterol. Are processed or pre-made. Count how many carbohydrates you eat and drink each day. Lifestyle If you drink alcohol: Limit how much you have to: 0-1 drink a day for women who are not pregnant. 0-2 drinks a day for men. Know how much alcohol is in your drink. In the [place] one drink equals one 12 oz bottle of beer (374m), one 5 oz glass of wine (1429m, or one 1 oz glass of hard liquor (4488m Do not smoke or use any products that have nicotine or tobacco. If you need help quitting, ask your doctor. Avoid secondhand smoke. Do not use drugs. Activity  Try to stay at a healthy weight. Get at least 30 minutes of exercise on most days, such as: Fast walking. Biking. Swimming. Medicines Take over-the-counter and prescription medicines only as told by your doctor. Avoid taking birth control pills. Talk to  your doctor about the risks of taking birth control pills if: You are over 35 65ars old. You smoke. You get very bad headaches. You have had a blood clot. Where to find more information American Stroke Association: www.strokeassociation.org Get help right away if: You or a loved one has any signs of a stroke. "BE FAST" is an easy way to remember the warning signs: B - Balance. Dizziness, sudden trouble walking, or loss of balance. E - Eyes. Trouble seeing or a change in how you see. F - Face. Sudden weakness or loss of feeling of the face. The face or eyelid may droop on one side. A - Arms. Weakness or loss of feeling in an arm. This happens all of a sudden and most often on one side of the body. S - Speech. Sudden trouble speaking, slurred speech, or trouble understanding what people say. T - Time. Time to call emergency services. Write down what time symptoms started. You or a loved one has other signs of a stroke, such as: A sudden, very bad headache with no known cause. Feeling like you may vomit (nausea). Vomiting. A seizure. These symptoms may be an emergency. Get help right away. Call your local emergency services (911 in the [place] Do not wait to see if the symptoms will go away. Do not drive yourself to the hospital. Summary You can help to prevent a stroke by eating healthy, exercising, and not smoking. It also helps to manage any health problems you have. Do not smoke or use any products that contain nicotine or tobacco. Get help right away if you or a loved one has any signs of a stroke. This information is not intended to replace advice given to you by your health care provider. Make sure you discuss any questions you have with your health care provider. Document Revised: 08/24/2019 Document Reviewed: 08/24/2019 Elsevier Patient Education  [address] [facility]

## 2021-09-11 NOTE — Patient Instructions (Signed)
Stroke Prevention Some medical conditions and lifestyle choices can lead to a higher risk for a stroke. You can help to prevent a stroke by eating healthy foods and exercising. It also helps to not smoke and to manage any health problems you may have. How can this condition affect me? A stroke is an emergency. It should be treated right away. A stroke can lead to brain damage or threaten your life. There is a better chance of surviving and getting better after a stroke if you get medical help right away. What can increase my risk? The following medical conditions may increase your risk of a stroke: Diseases of the heart and blood vessels (cardiovascular disease). High blood pressure (hypertension). Diabetes. High cholesterol. Sickle cell disease. Problems with blood clotting. Being very overweight. Sleeping problems (obstructivesleep apnea). Other risk factors include: Being older than age 60. A history of blood clots, stroke, or mini-stroke (TIA). Race, ethnic background, or a family history of stroke. Smoking or using tobacco products. Taking birth control pills, especially if you smoke. Heavy alcohol and drug use. Not being active. What actions can I take to prevent this? Manage your health conditions High cholesterol. Eat a healthy diet. If this is not enough to manage your cholesterol, you may need to take medicines. Take medicines as told by your doctor. High blood pressure. Try to keep your blood pressure below 130/80. If your blood pressure cannot be managed through a healthy diet and regular exercise, you may need to take medicines. Take medicines as told by your doctor. Ask your doctor if you should check your blood pressure at home. Have your blood pressure checked every year. Diabetes. Eat a healthy diet and get regular exercise. If your blood sugar (glucose) cannot be managed through diet and exercise, you may need to take medicines. Take medicines as told by your  doctor. Talk to your doctor about getting checked for sleeping problems. Signs of a problem can include: Snoring a lot. Feeling very tired. Make sure that you manage any other conditions you have. Nutrition  Follow instructions from your doctor about what to eat or drink. You may be told to: Eat and drink fewer calories each day. Limit how much salt (sodium) you use to 1,500 milligrams (mg) each day. Use only healthy fats for cooking, such as olive oil, canola oil, and sunflower oil. Eat healthy foods. To do this: Choose foods that are high in fiber. These include whole grains, and fresh fruits and vegetables. Eat at least 5 servings of fruits and vegetables a day. Try to fill one-half of your plate with fruits and vegetables at each meal. Choose low-fat (lean) proteins. These include low-fat cuts of meat, chicken without skin, fish, tofu, beans, and nuts. Eat low-fat dairy products. Avoid foods that: Are high in salt. Have saturated fat. Have trans fat. Have cholesterol. Are processed or pre-made. Count how many carbohydrates you eat and drink each day. Lifestyle If you drink alcohol: Limit how much you have to: 0-1 drink a day for women who are not pregnant. 0-2 drinks a day for men. Know how much alcohol is in your drink. In the [place] one drink equals one 12 oz bottle of beer (355mL), one 5 oz glass of wine (148mL), or one 1 oz glass of hard liquor (44mL). Do not smoke or use any products that have nicotine or tobacco. If you need help quitting, ask your doctor. Avoid secondhand smoke. Do not use drugs. Activity  Try to stay at a   healthy weight. Get at least 30 minutes of exercise on most days, such as: Fast walking. Biking. Swimming. Medicines Take over-the-counter and prescription medicines only as told by your doctor. Avoid taking birth control pills. Talk to your doctor about the risks of taking birth control pills if: You are over 35 years old. You smoke. You get  very bad headaches. You have had a blood clot. Where to find more information American Stroke Association: www.strokeassociation.org Get help right away if: You or a loved one has any signs of a stroke. "BE FAST" is an easy way to remember the warning signs: B - Balance. Dizziness, sudden trouble walking, or loss of balance. E - Eyes. Trouble seeing or a change in how you see. F - Face. Sudden weakness or loss of feeling of the face. The face or eyelid may droop on one side. A - Arms. Weakness or loss of feeling in an arm. This happens all of a sudden and most often on one side of the body. S - Speech. Sudden trouble speaking, slurred speech, or trouble understanding what people say. T - Time. Time to call emergency services. Write down what time symptoms started. You or a loved one has other signs of a stroke, such as: A sudden, very bad headache with no known cause. Feeling like you may vomit (nausea). Vomiting. A seizure. These symptoms may be an emergency. Get help right away. Call your local emergency services (911 in the [place] Do not wait to see if the symptoms will go away. Do not drive yourself to the hospital. Summary You can help to prevent a stroke by eating healthy, exercising, and not smoking. It also helps to manage any health problems you have. Do not smoke or use any products that contain nicotine or tobacco. Get help right away if you or a loved one has any signs of a stroke. This information is not intended to replace advice given to you by your health care provider. Make sure you discuss any questions you have with your health care provider. Document Revised: 08/24/2019 Document Reviewed: 08/24/2019 Elsevier Patient Education  2023 Elsevier Inc[employer]  

## 2021-09-11 NOTE — Assessment & Plan Note (Signed)
History of hemorrhagic stroke.  Not on anticoagulation. Secondary prevention of stroke discussed.

## 2021-09-21 ENCOUNTER — Other Ambulatory Visit: Payer: Self-pay | Admitting: Physician Assistant

## 2021-09-21 DIAGNOSIS — R0989 Other specified symptoms and signs involving the circulatory and respiratory systems: Secondary | ICD-10-CM

## 2021-09-30 ENCOUNTER — Other Ambulatory Visit: Payer: Self-pay | Admitting: Emergency Medicine

## 2021-12-11 ENCOUNTER — Ambulatory Visit (INDEPENDENT_AMBULATORY_CARE_PROVIDER_SITE_OTHER): Payer: Medicaid Other

## 2021-12-11 DIAGNOSIS — Z23 Encounter for immunization: Secondary | ICD-10-CM | POA: Diagnosis not present

## 2021-12-11 NOTE — Progress Notes (Signed)
Pt responded well. Administered in left arm.

## 2022-03-12 ENCOUNTER — Encounter: Payer: Self-pay | Admitting: Emergency Medicine

## 2022-03-12 ENCOUNTER — Ambulatory Visit (INDEPENDENT_AMBULATORY_CARE_PROVIDER_SITE_OTHER): Payer: Medicaid Other | Admitting: Emergency Medicine

## 2022-03-12 VITALS — BP 162/108 | HR 68 | Temp 98.4°F | Ht 59.0 in | Wt 135.4 lb

## 2022-03-12 DIAGNOSIS — Z23 Encounter for immunization: Secondary | ICD-10-CM | POA: Diagnosis not present

## 2022-03-12 DIAGNOSIS — R7303 Prediabetes: Secondary | ICD-10-CM

## 2022-03-12 DIAGNOSIS — G8114 Spastic hemiplegia affecting left nondominant side: Secondary | ICD-10-CM | POA: Diagnosis not present

## 2022-03-12 DIAGNOSIS — Z8673 Personal history of transient ischemic attack (TIA), and cerebral infarction without residual deficits: Secondary | ICD-10-CM

## 2022-03-12 DIAGNOSIS — I10 Essential (primary) hypertension: Secondary | ICD-10-CM | POA: Diagnosis not present

## 2022-03-12 DIAGNOSIS — Z1231 Encounter for screening mammogram for malignant neoplasm of breast: Secondary | ICD-10-CM

## 2022-03-12 DIAGNOSIS — E785 Hyperlipidemia, unspecified: Secondary | ICD-10-CM

## 2022-03-12 LAB — COMPREHENSIVE METABOLIC PANEL
ALT: 20 U/L (ref 0–35)
AST: 22 U/L (ref 0–37)
Albumin: 4.1 g/dL (ref 3.5–5.2)
Alkaline Phosphatase: 48 U/L (ref 39–117)
BUN: 20 mg/dL (ref 6–23)
CO2: 28 mEq/L (ref 19–32)
Calcium: 9.1 mg/dL (ref 8.4–10.5)
Chloride: 105 mEq/L (ref 96–112)
Creatinine, Ser: 1.05 mg/dL (ref 0.40–1.20)
GFR: 56.31 mL/min — ABNORMAL LOW (ref >60.00)
Glucose, Bld: 85 mg/dL (ref 70–99)
Potassium: 3.8 mEq/L (ref 3.5–5.1)
Sodium: 141 mEq/L (ref 135–145)
Total Bilirubin: 0.7 mg/dL (ref 0.2–1.2)
Total Protein: 7.6 g/dL (ref 6.0–8.3)

## 2022-03-12 LAB — LIPID PANEL
Cholesterol: 177 mg/dL (ref 0–200)
HDL: 86.9 mg/dL (ref >39.00)
LDL Cholesterol: 78 mg/dL (ref 0–99)
NonHDL: 89.94
Total CHOL/HDL Ratio: 2
Triglycerides: 61 mg/dL (ref 0.0–149.0)
VLDL: 12.2 mg/dL (ref 0.0–40.0)

## 2022-03-12 MED ORDER — AMLODIPINE BESYLATE 10 MG PO TABS: 10.0000 mg | ORAL_TABLET | Freq: Every day | ORAL | 3 refills | Status: DC

## 2022-03-12 MED ORDER — LOSARTAN POTASSIUM 50 MG PO TABS: ORAL_TABLET | ORAL | 0 refills | Status: DC

## 2022-03-12 NOTE — Assessment & Plan Note (Signed)
Last hemoglobin A1c is 6.0.  Repeated today. Diet and nutrition discussed. Advised to decrease amount of daily carbohydrate intake and daily calories and increase amount of plant-based protein in her diet.

## 2022-03-12 NOTE — Assessment & Plan Note (Signed)
Secondary prevention measures discussed. Blood work done today. Continue atorvastatin 40 mg daily. Importance of blood pressure control discussed. Needs neurology follow-up.  Referral placed today.

## 2022-03-12 NOTE — Assessment & Plan Note (Signed)
Residual deficits from hemorrhagic stroke in 2008.

## 2022-03-12 NOTE — Assessment & Plan Note (Signed)
Diet and nutrition discussed. Lipid profile done today. Continue atorvastatin 40 mg daily.

## 2022-03-12 NOTE — Patient Instructions (Signed)
Hypertension, Adult High blood pressure (hypertension) is when the force of blood pumping through the arteries is too strong. The arteries are the blood vessels that carry blood from the heart throughout the body. Hypertension forces the heart to work harder to pump blood and may cause arteries to become narrow or stiff. Untreated or uncontrolled hypertension can lead to a heart attack, heart failure, a stroke, kidney disease, and other problems. A blood pressure reading consists of a higher number over a lower number. Ideally, your blood pressure should be below 120/80. The first ("top") number is called the systolic pressure. It is a measure of the pressure in your arteries as your heart beats. The second ("bottom") number is called the diastolic pressure. It is a measure of the pressure in your arteries as the heart relaxes. What are the causes? The exact cause of this condition is not known. There are some conditions that result in high blood pressure. What increases the risk? Certain factors may make you more likely to develop high blood pressure. Some of these risk factors are under your control, including: Smoking. Not getting enough exercise or physical activity. Being overweight. Having too much fat, sugar, calories, or salt (sodium) in your diet. Drinking too much alcohol. Other risk factors include: Having a personal history of heart disease, diabetes, high cholesterol, or kidney disease. Stress. Having a family history of high blood pressure and high cholesterol. Having obstructive sleep apnea. Age. The risk increases with age. What are the signs or symptoms? High blood pressure may not cause symptoms. Very high blood pressure (hypertensive crisis) may cause: Headache. Fast or irregular heartbeats (palpitations). Shortness of breath. Nosebleed. Nausea and vomiting. Vision changes. Severe chest pain, dizziness, and seizures. How is this diagnosed? This condition is diagnosed by  measuring your blood pressure while you are seated, with your arm resting on a flat surface, your legs uncrossed, and your feet flat on the floor. The cuff of the blood pressure monitor will be placed directly against the skin of your upper arm at the level of your heart. Blood pressure should be measured at least twice using the same arm. Certain conditions can cause a difference in blood pressure between your right and left arms. If you have a high blood pressure reading during one visit or you have normal blood pressure with other risk factors, you may be asked to: Return on a different day to have your blood pressure checked again. Monitor your blood pressure at home for 1 week or longer. If you are diagnosed with hypertension, you may have other blood or imaging tests to help your health care provider understand your overall risk for other conditions. How is this treated? This condition is treated by making healthy lifestyle changes, such as eating healthy foods, exercising more, and reducing your alcohol intake. You may be referred for counseling on a healthy diet and physical activity. Your health care provider may prescribe medicine if lifestyle changes are not enough to get your blood pressure under control and if: Your systolic blood pressure is above 130. Your diastolic blood pressure is above 80. Your personal target blood pressure may vary depending on your medical conditions, your age, and other factors. Follow these instructions at home: Eating and drinking  Eat a diet that is high in fiber and potassium, and low in sodium, added sugar, and fat. An example of this eating plan is called the DASH diet. DASH stands for Dietary Approaches to Stop Hypertension. To eat this way: Eat   plenty of fresh fruits and vegetables. Try to fill one half of your plate at each meal with fruits and vegetables. Eat whole grains, such as whole-wheat pasta, brown rice, or whole-grain bread. Fill about one  fourth of your plate with whole grains. Eat or drink low-fat dairy products, such as skim milk or low-fat yogurt. Avoid fatty cuts of meat, processed or cured meats, and poultry with skin. Fill about one fourth of your plate with lean proteins, such as fish, chicken without skin, beans, eggs, or tofu. Avoid pre-made and processed foods. These tend to be higher in sodium, added sugar, and fat. Reduce your daily sodium intake. Many people with hypertension should eat less than 1,500 mg of sodium a day. Do not drink alcohol if: Your health care provider tells you not to drink. You are pregnant, may be pregnant, or are planning to become pregnant. If you drink alcohol: Limit how much you have to: 0-1 drink a day for women. 0-2 drinks a day for men. Know how much alcohol is in your drink. In the [place] one drink equals one 12 oz bottle of beer (355 mL), one 5 oz glass of wine (148 mL), or one 1 oz glass of hard liquor (44 mL). Lifestyle  Work with your health care provider to maintain a healthy body weight or to lose weight. Ask what an ideal weight is for you. Get at least 30 minutes of exercise that causes your heart to beat faster (aerobic exercise) most days of the week. Activities may include walking, swimming, or biking. Include exercise to strengthen your muscles (resistance exercise), such as Pilates or lifting weights, as part of your weekly exercise routine. Try to do these types of exercises for 30 minutes at least 3 days a week. Do not use any products that contain nicotine or tobacco. These products include cigarettes, chewing tobacco, and vaping devices, such as e-cigarettes. If you need help quitting, ask your health care provider. Monitor your blood pressure at home as told by your health care provider. Keep all follow-up visits. This is important. Medicines Take over-the-counter and prescription medicines only as told by your health care provider. Follow directions carefully. Blood  pressure medicines must be taken as prescribed. Do not skip doses of blood pressure medicine. Doing this puts you at risk for problems and can make the medicine less effective. Ask your health care provider about side effects or reactions to medicines that you should watch for. Contact a health care provider if you: Think you are having a reaction to a medicine you are taking. Have headaches that keep coming back (recurring). Feel dizzy. Have swelling in your ankles. Have trouble with your vision. Get help right away if you: Develop a severe headache or confusion. Have unusual weakness or numbness. Feel faint. Have severe pain in your chest or abdomen. Vomit repeatedly. Have trouble breathing. These symptoms may be an emergency. Get help right away. Call 911. Do not wait to see if the symptoms will go away. Do not drive yourself to the hospital. Summary Hypertension is when the force of blood pumping through your arteries is too strong. If this condition is not controlled, it may put you at risk for serious complications. Your personal target blood pressure may vary depending on your medical conditions, your age, and other factors. For most people, a normal blood pressure is less than 120/80. Hypertension is treated with lifestyle changes, medicines, or a combination of both. Lifestyle changes include losing weight, eating a healthy,   low-sodium diet, exercising more, and limiting alcohol. This information is not intended to replace advice given to you by your health care provider. Make sure you discuss any questions you have with your health care provider. Document Revised: 11/29/2020 Document Reviewed: 11/29/2020 Elsevier Patient Education  2023 [employer] Inc[employer]  

## 2022-03-12 NOTE — Assessment & Plan Note (Signed)
Elevated blood pressure reading in the office today.  Ran out of amlodipine 3 weeks ago. Continue losartan 50 mg daily and restart amlodipine 10 mg daily. Cardiovascular risks associated with uncontrolled hypertension discussed.

## 2022-03-12 NOTE — Progress Notes (Signed)
Barbara Strickland 62 y.o.   Chief Complaint  Patient presents with   Follow-up    2mth f/u appt, still having weakness in her left leg     HISTORY OF PRESENT ILLNESS: This is a 62y.o. female here for 633-monthollow-up of chronic medical conditions. Here with daughter and also interpreter with her. History of stroke in 2008. History of hypertension.  On losartan and amlodipine.  Ran out of amlodipine 3 weeks ago. No recent neurology follow-up. No other complaints or medical concerns today. BP Readings from Last 3 Encounters:  03/12/22 (!) 160/100  09/11/21 120/76  09/05/21 (!) 149/69   Lab Results  Component Value Date   CHOL 176 03/14/2021   HDL 75.10 03/14/2021   LDLCALC 83 03/14/2021   TRIG 90.0 03/14/2021   CHOLHDL 2 03/14/2021     HPI   Prior to Admission medications   Medication Sig Start Date End Date Taking? Authorizing Provider  amLODipine (NORVASC) 10 MG tablet Take 1 tablet (10 mg total) by mouth daily. 08/14/21   Raspet, ErDerry SkillPA-C  atorvastatin (LIPITOR) 40 MG tablet TAKE 1 TABLET(40 MG) BY MOUTH DAILY 10/01/21   SaHorald PollenMD  losartan (COZAAR) 50 MG tablet TAKE 1 TABLET(50 MG) BY MOUTH DAILY 08/14/21   Raspet, ErDerry SkillPA-C    No Known Allergies  Patient Active Problem List   Diagnosis Date Noted   Prediabetes 03/14/2021   History of stroke 09/05/2020   History of kidney stones 09/05/2020   Ureterolithiasis    Kidney stone on right side 01/03/2020   Essential hypertension 01/03/2020   Hydronephrosis 01/03/2020   Prolonged QT interval 01/03/2020   Dyslipidemia 11/03/2019   Hx of adenomatous colonic polyps 08/05/2017   [place] sp[place]iparesis ([facility]11[facility]   [place]agic [place]   Essential hypertension 06/21/2015    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   Hemorrhagic stroke ([facility]   History of kidney stones    Hx of adenomatous colonic polyps  08/05/2017   Hypertension    Illiterate    pt cannot read/write english, daughter chien h signs consents   Left spastic hemiparesis ([facility]   uses cane   Microcytic anemia    Tubular adenoma of colon     Past Surgical History:  Procedure Laterality Date   CYSTOSCOPY W/ URETERAL STENT PLACEMENT Right 01/03/2020   Procedure: CYSTOSCOPY WITH RETROGRADE PYELOGRAM/URETERAL STENT PLACEMENT;  Surgeon: WiCeasar MonsMD;  Location: [facility];  Service: Urology;  Laterality: Right;   CYSTOSCOPY/RETROGRADE/URETEROSCOPY/STONE EXTRACTION WITH BASKET Right 05/04/2020   Procedure: CYSTOSCOPY/RETROGRADE/URETEROSCOPY/ HOLMIUM LASER LITHOTRIPSY/STONE EXTRACTION WITH BASKET/ STENT EXCHANGE;  Surgeon: WiCeasar MonsMD;  [facility];  Laterality: Right;   CYSTOSCOPY/URETEROSCOPY/HOLMIUM LASER/STENT PLACEMENT Right 03/09/2020   Procedure: CYSTOSCOPY/RETROGRADE/URETEROSCOPY/HOLMIUM LASER/STENT REPLACEMENT;  Surgeon: WiCeasar MonsMD;  [facility];  Laterality: Right;   CYSTOSCOPY/URETEROSCOPY/HOLMIUM LASER/STENT PLACEMENT Right 03/23/2020   Procedure: CYSTOSCOPY/STENT REMOVAL, URETEROSCOPY/HOLMIUM LASER/STENT PLACEMENT;  Surgeon: WiCeasar MonsMD;  [facility];  Laterality: Right;   HOLMIUM LASER APPLICATION Right 04/08/79/8299 Procedure: HOLMIUM LASER APPLICATION;  Surgeon: WiCeasar MonsMD;  [facility];  Laterality: Right;    Social History   Socioeconomic History   Marital status: Married    Spouse name: Not on file  Number of children: 4   Years of education: Not on file   Highest education level: Not on file  Occupational History   Not on file  Tobacco Use   Smoking status: Never   Smokeless tobacco: Never  Vaping Use   Vaping Use: Never used  Substance and Sexual  Activity   Alcohol use: Never   Drug use: Never   Sexual activity: Not on file  Other Topics Concern   Not on file  Social History Narrative   ** Merged History Encounter **       Social Determinants of Health   Financial Resource Strain: Not on file  Food Insecurity: Not on file  Transportation Needs: Not on file  Physical Activity: Not on file  Stress: Not on file  Social Connections: Not on file  Intimate Partner Violence: Not on file    Family History  Problem Relation Age of Onset   Hypertension Neg Hx    Hyperlipidemia Neg Hx    Colon cancer Neg Hx    Colon polyps Neg Hx    Esophageal cancer Neg Hx    Rectal cancer Neg Hx    Stomach cancer Neg Hx    Breast cancer Neg Hx    BRCA 1/2 Neg Hx    Diabetes Neg Hx      Review of Systems  Constitutional: Negative.  Negative for chills and fever.  HENT: Negative.  Negative for congestion and sore throat.   Respiratory: Negative.  Negative for cough and shortness of breath.   Cardiovascular: Negative.  Negative for chest pain and palpitations.  Gastrointestinal:  Negative for abdominal pain, diarrhea, nausea and vomiting.  Genitourinary: Negative.   Skin: Negative.  Negative for rash.  Neurological: Negative.  Negative for dizziness and headaches.  All other systems reviewed and are negative.  Today's Vitals   03/12/22 0816  BP: (!) 160/100  Pulse: 68  Temp: 98.4 F (36.9 C)  TempSrc: Oral  SpO2: 95%  Weight: 135 lb 6 oz (61.4 kg)  Height: '4\' 11"'$  (1.499 m)   Body mass index is 27.34 kg/m.   Physical Exam Vitals reviewed.  Constitutional:      Appearance: Normal appearance.  HENT:     Head: Normocephalic.     Mouth/Throat:     Mouth: Mucous membranes are moist.     Pharynx: Oropharynx is clear.  Eyes:     Extraocular Movements: Extraocular movements intact.     Pupils: Pupils are equal, round, and reactive to light.  Cardiovascular:     Rate and Rhythm: Normal rate and regular rhythm.     Pulses:  Normal pulses.     Heart sounds: Normal heart sounds.  Pulmonary:     Effort: Pulmonary effort is normal.     Breath sounds: Normal breath sounds.  Musculoskeletal:     Cervical back: No tenderness.  Lymphadenopathy:     Cervical: No cervical adenopathy.  Skin:    General: Skin is warm and dry.  Neurological:     Mental Status: She is alert and oriented to person, place, and time. Mental status is at baseline.     Comments: Left-sided spastic hemiparesis  Psychiatric:        Mood and Affect: Mood normal.        Behavior: Behavior normal.      ASSESSMENT & PLAN: A total of 44 minutes was spent with the patient and counseling/coordination of care regarding preparing for this visit, review of most recent office  visit notes, review of most recent blood work results, review of health maintenance items, review of multiple chronic medical conditions under management, review of all medications, cardiovascular risks associated with uncontrolled hypertension, education on nutrition, prognosis, documentation, and need for follow-up in 3 months  Problem List Items Addressed This Visit       Cardiovascular and Mediastinum   Essential hypertension - Primary    Elevated blood pressure reading in the office today.  Ran out of amlodipine 3 weeks ago. Continue losartan 50 mg daily and restart amlodipine 10 mg daily. Cardiovascular risks associated with uncontrolled hypertension discussed.      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      Relevant Orders   Ambulatory referral to Neurology     Other   Dyslipidemia    Diet and nutrition discussed. Lipid profile done today. Continue atorvastatin 40 mg daily.      History of stroke    Secondary prevention  measures discussed. Blood work done today. Continue atorvastatin 40 mg daily. Importance of blood pressure control discussed. Needs neurology follow-up.  Referral placed today.      Relevant Orders   Ambulatory referral to Neurology   Prediabetes    Last hemoglobin A1c is 6.0.  Repeated today. Diet and nutrition discussed. Advised to decrease amount of daily carbohydrate intake and daily calories and increase amount of plant-based protein in her diet.      Other Visit Diagnoses     Need for vaccination       Relevant Orders   Flu Vaccine QUAD 6+ mos PF IM (Fluarix Quad PF)   Encounter for screening mammogram for malignant neoplasm of breast       Relevant Orders   MM Digital Screening      Patient Instructions  Hypertension, Adult High blood pressure (hypertension) is when the force of blood pumping through the arteries is too strong. The arteries are the blood vessels that carry blood from the heart throughout the body. Hypertension forces the heart to work harder to pump blood and may cause arteries to become narrow or stiff. Untreated or uncontrolled hypertension can lead to a heart attack, heart failure, a stroke, kidney disease, and other problems. A blood pressure reading consists of a higher number over a lower number. Ideally, your blood pressure should be below 120/80. The first ("top") number is called the systolic pressure. It is a measure of the pressure in your arteries as your heart beats. The second ("bottom") number is called the diastolic pressure. It is a measure of the pressure in your arteries as the heart relaxes. What are the causes? The exact cause of this condition is not known. There are some conditions that result in high blood pressure. What increases the risk? Certain factors may make you more likely to develop high blood pressure. Some of these risk factors are under your control, including: Smoking. Not getting enough exercise or physical  activity. Being overweight. Having too much fat, sugar, calories, or salt (sodium) in your diet. Drinking too much alcohol. Other risk factors include: Having a personal history of heart disease, diabetes, high cholesterol, or kidney disease. Stress. Having a family history of high blood pressure and high cholesterol. Having obstructive  sleep apnea. Age. The risk increases with age. What are the signs or symptoms? High blood pressure may not cause symptoms. Very high blood pressure (hypertensive crisis) may cause: Headache. Fast or irregular heartbeats (palpitations). Shortness of breath. Nosebleed. Nausea and vomiting. Vision changes. Severe chest pain, dizziness, and seizures. How is this diagnosed? This condition is diagnosed by measuring your blood pressure while you are seated, with your arm resting on a flat surface, your legs uncrossed, and your feet flat on the floor. The cuff of the blood pressure monitor will be placed directly against the skin of your upper arm at the level of your heart. Blood pressure should be measured at least twice using the same arm. Certain conditions can cause a difference in blood pressure between your right and left arms. If you have a high blood pressure reading during one visit or you have normal blood pressure with other risk factors, you may be asked to: Return on a different day to have your blood pressure checked again. Monitor your blood pressure at home for 1 week or longer. If you are diagnosed with hypertension, you may have other blood or imaging tests to help your health care provider understand your overall risk for other conditions. How is this treated? This condition is treated by making healthy lifestyle changes, such as eating healthy foods, exercising more, and reducing your alcohol intake. You may be referred for counseling on a healthy diet and physical activity. Your health care provider may prescribe medicine if lifestyle changes  are not enough to get your blood pressure under control and if: Your systolic blood pressure is above 130. Your diastolic blood pressure is above 80. Your personal target blood pressure may vary depending on your medical conditions, your age, and other factors. Follow these instructions at home: Eating and drinking  Eat a diet that is high in fiber and potassium, and low in sodium, added sugar, and fat. An example of this eating plan is called the DASH diet. DASH stands for Dietary Approaches to Stop Hypertension. To eat this way: Eat plenty of fresh fruits and vegetables. Try to fill one half of your plate at each meal with fruits and vegetables. Eat whole grains, such as whole-wheat pasta, brown rice, or whole-grain bread. Fill about one fourth of your plate with whole grains. Eat or drink low-fat dairy products, such as skim milk or low-fat yogurt. Avoid fatty cuts of meat, processed or cured meats, and poultry with skin. Fill about one fourth of your plate with lean proteins, such as fish, chicken without skin, beans, eggs, or tofu. Avoid pre-made and processed foods. These tend to be higher in sodium, added sugar, and fat. Reduce your daily sodium intake. Many people with hypertension should eat less than 1,500 mg of sodium a day. Do not drink alcohol if: Your health care provider tells you not to drink. You are pregnant, may be pregnant, or are planning to become pregnant. If you drink alcohol: Limit how much you have to: 0-1 drink a day for women. 0-2 drinks a day for men. Know how much alcohol is in your drink. In the [place] one drink equals one 12 oz bottle of beer (355 mL), one 5 oz glass of wine (148 mL), or one 1 oz glass of hard liquor (44 mL). Lifestyle  Work with your health care provider to maintain a healthy body weight or to lose weight. Ask what an ideal weight is for you. Get at least 30 minutes of exercise  that causes your heart to beat faster (aerobic exercise) most  days of the week. Activities may include walking, swimming, or biking. Include exercise to strengthen your muscles (resistance exercise), such as Pilates or lifting weights, as part of your weekly exercise routine. Try to do these types of exercises for 30 minutes at least 3 days a week. Do not use any products that contain nicotine or tobacco. These products include cigarettes, chewing tobacco, and vaping devices, such as e-cigarettes. If you need help quitting, ask your health care provider. Monitor your blood pressure at home as told by your health care provider. Keep all follow-up visits. This is important. Medicines Take over-the-counter and prescription medicines only as told by your health care provider. Follow directions carefully. Blood pressure medicines must be taken as prescribed. Do not skip doses of blood pressure medicine. Doing this puts you at risk for problems and can make the medicine less effective. Ask your health care provider about side effects or reactions to medicines that you should watch for. Contact a health care provider if you: Think you are having a reaction to a medicine you are taking. Have headaches that keep coming back (recurring). Feel dizzy. Have swelling in your ankles. Have trouble with your vision. Get help right away if you: Develop a severe headache or confusion. Have unusual weakness or numbness. Feel faint. Have severe pain in your chest or abdomen. Vomit repeatedly. Have trouble breathing. These symptoms may be an emergency. Get help right away. Call 911. Do not wait to see if the symptoms will go away. Do not drive yourself to the hospital. Summary Hypertension is when the force of blood pumping through your arteries is too strong. If this condition is not controlled, it may put you at risk for serious complications. Your personal target blood pressure may vary depending on your medical conditions, your age, and other factors. For most people,  a normal blood pressure is less than 120/80. Hypertension is treated with lifestyle changes, medicines, or a combination of both. Lifestyle changes include losing weight, eating a healthy, low-sodium diet, exercising more, and limiting alcohol. This information is not intended to replace advice given to you by your health care provider. Make sure you discuss any questions you have with your health care provider. Document Revised: 11/29/2020 Document Reviewed: 11/29/2020 Elsevier Patient Education  [place], [place] [facility]

## 2022-03-27 ENCOUNTER — Encounter: Payer: Self-pay | Admitting: Neurology

## 2022-05-16 ENCOUNTER — Ambulatory Visit
Admission: RE | Admit: 2022-05-16 | Discharge: 2022-05-16 | Disposition: A | Discharge status: Home / Self Care | Payer: Medicaid Other | Source: Ambulatory Visit | Attending: Emergency Medicine | Admitting: Emergency Medicine

## 2022-05-16 DIAGNOSIS — Z1231 Encounter for screening mammogram for malignant neoplasm of breast: Secondary | ICD-10-CM

## 2022-06-12 ENCOUNTER — Encounter: Payer: Self-pay | Admitting: Emergency Medicine

## 2022-06-12 ENCOUNTER — Ambulatory Visit (INDEPENDENT_AMBULATORY_CARE_PROVIDER_SITE_OTHER): Payer: Medicaid Other

## 2022-06-12 ENCOUNTER — Ambulatory Visit (INDEPENDENT_AMBULATORY_CARE_PROVIDER_SITE_OTHER): Payer: Medicaid Other | Admitting: Emergency Medicine

## 2022-06-12 VITALS — BP 122/72 | HR 68 | Temp 98.0°F | Ht 59.0 in | Wt 132.4 lb

## 2022-06-12 DIAGNOSIS — E785 Hyperlipidemia, unspecified: Secondary | ICD-10-CM

## 2022-06-12 DIAGNOSIS — R7303 Prediabetes: Secondary | ICD-10-CM | POA: Diagnosis not present

## 2022-06-12 DIAGNOSIS — Z8673 Personal history of transient ischemic attack (TIA), and cerebral infarction without residual deficits: Secondary | ICD-10-CM

## 2022-06-12 DIAGNOSIS — M25562 Pain in left knee: Secondary | ICD-10-CM

## 2022-06-12 DIAGNOSIS — G8929 Other chronic pain: Secondary | ICD-10-CM | POA: Insufficient documentation

## 2022-06-12 DIAGNOSIS — I10 Essential (primary) hypertension: Secondary | ICD-10-CM

## 2022-06-12 DIAGNOSIS — G8114 Spastic hemiplegia affecting left nondominant side: Secondary | ICD-10-CM | POA: Diagnosis not present

## 2022-06-12 MED ORDER — MELOXICAM 7.5 MG PO TABS
7.5000 mg | ORAL_TABLET | Freq: Every day | ORAL | 0 refills | Status: DC
Start: 2022-06-12 — End: 2022-06-19

## 2022-06-12 NOTE — Assessment & Plan Note (Signed)
Stable.  Diet and nutrition discussed. 

## 2022-06-12 NOTE — Assessment & Plan Note (Signed)
Secondary prevention of stroke discussed

## 2022-06-12 NOTE — Assessment & Plan Note (Signed)
Well-controlled hypertension Continue amlodipine 10 mg and losartan 50 mg daily Cardiovascular risk associated with hypertension discussed Dietary approaches to stop hypertension discussed

## 2022-06-12 NOTE — Assessment & Plan Note (Signed)
Residual defect from stroke

## 2022-06-12 NOTE — Progress Notes (Signed)
Barbara Strickland 62 y.o.   Chief Complaint  Patient presents with   Medical Management of Chronic Issues    f/u appt, left knee pain, stiffness     HISTORY OF PRESENT ILLNESS: This is a 62 y.o. female here for 42-month follow-up of uncontrolled hypertension Back on her antihypertensive medications with normal blood pressure readings at home Also complaining of chronic left knee pain for over a year No other complaints or medical concerns today.  Accompanied by daughter and translator  HPI   Prior to Admission medications   Medication Sig Start Date End Date Taking? Authorizing Provider  amLODipine (NORVASC) 10 MG tablet Take 1 tablet (10 mg total) by mouth daily. 03/12/22  Yes Georgina Quint, MD  atorvastatin (LIPITOR) 40 MG tablet TAKE 1 TABLET(40 MG) BY MOUTH DAILY 10/01/21  Yes Georgina Quint, MD  losartan (COZAAR) 50 MG tablet TAKE 1 TABLET(50 MG) BY MOUTH DAILY 03/12/22  Yes Georgina Quint, MD    No Known Allergies  Patient Active Problem List   Diagnosis Date Noted   Prediabetes 03/14/2021   History of stroke 09/05/2020   History of kidney stones 09/05/2020   Ureterolithiasis    Kidney stone on right side 01/03/2020   Essential hypertension 01/03/2020   Hydronephrosis 01/03/2020   Prolonged QT interval 01/03/2020   Dyslipidemia 11/03/2019   Hx of adenomatous colonic polyps 08/05/2017   Left spastic hemiparesis (HCC) 05/15/2017   Hemorrhagic stroke (HCC) 06/21/2015   Essential hypertension 06/21/2015    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[name]    Hemorrhagic stroke (HCC) 2009   History of kidney stones    Hx of adenomatous colonic polyps 08/05/2017   Hypertension    Illiterate    pt cannot read/write english, daughter chien h signs consents   Left spastic hemiparesis (HCC)    uses cane   Microcytic anemia    Tubular adenoma of colon     Past Surgical History:  Procedure Laterality  Date   CYSTOSCOPY W/ URETERAL STENT PLACEMENT Right 01/03/2020   Procedure: CYSTOSCOPY WITH RETROGRADE PYELOGRAM/URETERAL STENT PLACEMENT;  Surgeon: Rene Paci, MD;  Location: [facility];  Service: Urology;  Laterality: Right;   CYSTOSCOPY/RETROGRADE/URETEROSCOPY/STONE EXTRACTION WITH BASKET Right 05/04/2020   Procedure: CYSTOSCOPY/RETROGRADE/URETEROSCOPY/ HOLMIUM LASER LITHOTRIPSY/STONE EXTRACTION WITH BASKET/ STENT EXCHANGE;  Surgeon: Rene Paci, MD;  Location: [facility];  Service: Urology;  Laterality: Right;   CYSTOSCOPY/URETEROSCOPY/HOLMIUM LASER/STENT PLACEMENT Right 03/09/2020   Procedure: CYSTOSCOPY/RETROGRADE/URETEROSCOPY/HOLMIUM LASER/STENT REPLACEMENT;  Surgeon: Rene Paci, MD;  Location: [facility];  Service: Urology;  Laterality: Right;   CYSTOSCOPY/URETEROSCOPY/HOLMIUM LASER/STENT PLACEMENT Right 03/23/2020   Procedure: CYSTOSCOPY/STENT REMOVAL, URETEROSCOPY/HOLMIUM LASER/STENT PLACEMENT;  Surgeon: Rene Paci, MD;  Location: [facility];  Service: Urology;  Laterality: Right;   HOLMIUM LASER APPLICATION Right 05/04/2020   Procedure: HOLMIUM LASER APPLICATION;  Surgeon: Rene Paci, MD;  Location: [facility];  Service: Urology;  Laterality: Right;    Social History   Socioeconomic History   Marital status: Married    Spouse name: Not on file   Number of children: 4   Years of education: Not on file   Highest education level: Not on file  Occupational History   Not on file  Tobacco Use   Smoking status: Never   Smokeless tobacco: Never  Vaping Use   Vaping Use: Never used  Substance and Sexual Activity  Alcohol use: Never   Drug use: Never   Sexual activity: Not on file  Other Topics Concern   Not on file  Social History Narrative   ** Merged History Encounter **       Social Determinants of Health   Financial Resource Strain: Not on  file  Food Insecurity: Not on file  Transportation Needs: Not on file  Physical Activity: Not on file  Stress: Not on file  Social Connections: Not on file  Intimate Partner Violence: Not on file    Family History  Problem Relation Age of Onset   Hypertension Neg Hx    Hyperlipidemia Neg Hx    Colon cancer Neg Hx    Colon polyps Neg Hx    Esophageal cancer Neg Hx    Rectal cancer Neg Hx    Stomach cancer Neg Hx    Breast cancer Neg Hx    BRCA 1/2 Neg Hx    Diabetes Neg Hx      Review of Systems  Constitutional: Negative.  Negative for chills and fever.  HENT: Negative.  Negative for congestion and sore throat.   Respiratory: Negative.  Negative for cough and shortness of breath.   Cardiovascular: Negative.  Negative for palpitations.  Gastrointestinal:  Negative for abdominal pain, diarrhea, nausea and vomiting.  Musculoskeletal:  Positive for joint pain (Left knee pain).  Skin: Negative.  Negative for rash.  Neurological: Negative.  Negative for dizziness and headaches.  All other systems reviewed and are negative.   Vitals:   06/12/22 0810  BP: 122/72  Pulse: 68  Temp: 98 F (36.7 C)  SpO2: 97%    Physical Exam Vitals reviewed.  Constitutional:      Appearance: Normal appearance.  HENT:     Head: Normocephalic.     Mouth/Throat:     Mouth: Mucous membranes are moist.     Pharynx: Oropharynx is clear.  Eyes:     Extraocular Movements: Extraocular movements intact.     Conjunctiva/sclera: Conjunctivae normal.     Pupils: Pupils are equal, round, and reactive to light.  Cardiovascular:     Rate and Rhythm: Normal rate and regular rhythm.     Pulses: Normal pulses.     Heart sounds: Normal heart sounds.  Pulmonary:     Effort: Pulmonary effort is normal.     Breath sounds: Normal breath sounds.  Musculoskeletal:     Cervical back: No tenderness.     Comments: Left knee: Mild swelling.  No erythema.  Full range of motion but complains of pain.  No  localized tenderness.  No signs of DVT.  Stable in flexion and extension.  Lymphadenopathy:     Cervical: No cervical adenopathy.  Skin:    General: Skin is warm and dry.  Neurological:     Mental Status: She is alert and oriented to person, place, and time. Mental status is at baseline.  Psychiatric:        Mood and Affect: Mood normal.        Behavior: Behavior normal.   DG Knee Complete 4 Views Left  Result Date: 06/12/2022 CLINICAL DATA:  Chronic left knee pain EXAM: LEFT KNEE - COMPLETE 4+ VIEW COMPARISON:  None Available. FINDINGS: Mild degenerative arthropathy with some degree joint space loss of the medial and lateral compartments with bony spurring. No acute osseous finding, fracture, malalignment or large joint effusion. No focal soft tissue abnormality. Patella is located. IMPRESSION: Mild degenerative changes as above. No acute finding  by plain radiography. Electronically Signed   By: Judie Petit.  Shick M.D.   On: 06/12/2022 09:16     ASSESSMENT & PLAN: A total of 42 minutes was spent with the patient and counseling/coordination of care regarding preparing for this visit, review of most recent office visit notes, review of multiple chronic medical conditions under management, differential diagnosis of knee pain and need for orthopedic evaluation, review of today's x-ray report, pain management, education on nutrition, prognosis, documentation and need for follow-up  Problem List Items Addressed This Visit       Cardiovascular and Mediastinum   Essential hypertension - Primary    Well-controlled hypertension Continue amlodipine 10 mg and losartan 50 mg daily Cardiovascular risk associated with hypertension discussed Dietary approaches to stop hypertension discussed        Nervous and Auditory   Left spastic hemiparesis (HCC)    Residual defect from stroke        Other   Dyslipidemia    Stable chronic condition Normal lipid profile 3 months ago Continue atorvastatin 40 mg  daily Diet and nutrition discussed      History of stroke    Secondary prevention of stroke discussed      Prediabetes    Stable.  Diet and nutrition discussed.      Chronic pain of left knee    Active and affecting quality of life X-ray done today.  Report reviewed. Needs orthopedic evaluation Referral placed today Recommend meloxicam 7.5 mg daily as needed      Relevant Medications   meloxicam (MOBIC) 7.5 MG tablet   Other Relevant Orders   DG Knee Complete 4 Views Left   Ambulatory referral to Sports Medicine   Patient Instructions  Hypertension, Adult High blood pressure (hypertension) is when the force of blood pumping through the arteries is too strong. The arteries are the blood vessels that carry blood from the heart throughout the body. Hypertension forces the heart to work harder to pump blood and may cause arteries to become narrow or stiff. Untreated or uncontrolled hypertension can lead to a heart attack, heart failure, a stroke, kidney disease, and other problems. A blood pressure reading consists of a higher number over a lower number. Ideally, your blood pressure should be below 120/80. The first ("top") number is called the systolic pressure. It is a measure of the pressure in your arteries as your heart beats. The second ("bottom") number is called the diastolic pressure. It is a measure of the pressure in your arteries as the heart relaxes. What are the causes? The exact cause of this condition is not known. There are some conditions that result in high blood pressure. What increases the risk? Certain factors may make you more likely to develop high blood pressure. Some of these risk factors are under your control, including: Smoking. Not getting enough exercise or physical activity. Being overweight. Having too much fat, sugar, calories, or salt (sodium) in your diet. Drinking too much alcohol. Other risk factors include: Having a personal history of heart  disease, diabetes, high cholesterol, or kidney disease. Stress. Having a family history of high blood pressure and high cholesterol. Having obstructive sleep apnea. Age. The risk increases with age. What are the signs or symptoms? High blood pressure may not cause symptoms. Very high blood pressure (hypertensive crisis) may cause: Headache. Fast or irregular heartbeats (palpitations). Shortness of breath. Nosebleed. Nausea and vomiting. Vision changes. Severe chest pain, dizziness, and seizures. How is this diagnosed? This condition is diagnosed  by measuring your blood pressure while you are seated, with your arm resting on a flat surface, your legs uncrossed, and your feet flat on the floor. The cuff of the blood pressure monitor will be placed directly against the skin of your upper arm at the level of your heart. Blood pressure should be measured at least twice using the same arm. Certain conditions can cause a difference in blood pressure between your right and left arms. If you have a high blood pressure reading during one visit or you have normal blood pressure with other risk factors, you may be asked to: Return on a different day to have your blood pressure checked again. Monitor your blood pressure at home for 1 week or longer. If you are diagnosed with hypertension, you may have other blood or imaging tests to help your health care provider understand your overall risk for other conditions. How is this treated? This condition is treated by making healthy lifestyle changes, such as eating healthy foods, exercising more, and reducing your alcohol intake. You may be referred for counseling on a healthy diet and physical activity. Your health care provider may prescribe medicine if lifestyle changes are not enough to get your blood pressure under control and if: Your systolic blood pressure is above 130. Your diastolic blood pressure is above 80. Your personal target blood pressure may  vary depending on your medical conditions, your age, and other factors. Follow these instructions at home: Eating and drinking  Eat a diet that is high in fiber and potassium, and low in sodium, added sugar, and fat. An example of this eating plan is called the DASH diet. DASH stands for Dietary Approaches to Stop Hypertension. To eat this way: Eat plenty of fresh fruits and vegetables. Try to fill one half of your plate at each meal with fruits and vegetables. Eat whole grains, such as whole-wheat pasta, brown rice, or whole-grain bread. Fill about one fourth of your plate with whole grains. Eat or drink low-fat dairy products, such as skim milk or low-fat yogurt. Avoid fatty cuts of meat, processed or cured meats, and poultry with skin. Fill about one fourth of your plate with lean proteins, such as fish, chicken without skin, beans, eggs, or tofu. Avoid pre-made and processed foods. These tend to be higher in sodium, added sugar, and fat. Reduce your daily sodium intake. Many people with hypertension should eat less than 1,500 mg of sodium a day. Do not drink alcohol if: Your health care provider tells you not to drink. You are pregnant, may be pregnant, or are planning to become pregnant. If you drink alcohol: Limit how much you have to: 0-1 drink a day for women. 0-2 drinks a day for men. Know how much alcohol is in your drink. In the [place] one drink equals one 12 oz bottle of beer (355 mL), one 5 oz glass of wine (148 mL), or one 1 oz glass of hard liquor (44 mL). Lifestyle  Work with your health care provider to maintain a healthy body weight or to lose weight. Ask what an ideal weight is for you. Get at least 30 minutes of exercise that causes your heart to beat faster (aerobic exercise) most days of the week. Activities may include walking, swimming, or biking. Include exercise to strengthen your muscles (resistance exercise), such as Pilates or lifting weights, as part of your  weekly exercise routine. Try to do these types of exercises for 30 minutes at least 3 days a  week. Do not use any products that contain nicotine or tobacco. These products include cigarettes, chewing tobacco, and vaping devices, such as e-cigarettes. If you need help quitting, ask your health care provider. Monitor your blood pressure at home as told by your health care provider. Keep all follow-up visits. This is important. Medicines Take over-the-counter and prescription medicines only as told by your health care provider. Follow directions carefully. Blood pressure medicines must be taken as prescribed. Do not skip doses of blood pressure medicine. Doing this puts you at risk for problems and can make the medicine less effective. Ask your health care provider about side effects or reactions to medicines that you should watch for. Contact a health care provider if you: Think you are having a reaction to a medicine you are taking. Have headaches that keep coming back (recurring). Feel dizzy. Have swelling in your ankles. Have trouble with your vision. Get help right away if you: Develop a severe headache or confusion. Have unusual weakness or numbness. Feel faint. Have severe pain in your chest or abdomen. Vomit repeatedly. Have trouble breathing. These symptoms may be an emergency. Get help right away. Call 911. Do not wait to see if the symptoms will go away. Do not drive yourself to the hospital. Summary Hypertension is when the force of blood pumping through your arteries is too strong. If this condition is not controlled, it may put you at risk for serious complications. Your personal target blood pressure may vary depending on your medical conditions, your age, and other factors. For most people, a normal blood pressure is less than 120/80. Hypertension is treated with lifestyle changes, medicines, or a combination of both. Lifestyle changes include losing weight, eating a healthy,  low-sodium diet, exercising more, and limiting alcohol. This information is not intended to replace advice given to you by your health care provider. Make sure you discuss any questions you have with your health care provider. Document Revised: 11/29/2020 Document Reviewed: 11/29/2020 Elsevier Patient Education  2023 Elsevier Inc.    Edwina Barth, MD [facility]

## 2022-06-12 NOTE — Patient Instructions (Signed)
Hypertension, Adult High blood pressure (hypertension) is when the force of blood pumping through the arteries is too strong. The arteries are the blood vessels that carry blood from the heart throughout the body. Hypertension forces the heart to work harder to pump blood and may cause arteries to become narrow or stiff. Untreated or uncontrolled hypertension can lead to a heart attack, heart failure, a stroke, kidney disease, and other problems. A blood pressure reading consists of a higher number over a lower number. Ideally, your blood pressure should be below 120/80. The first ("top") number is called the systolic pressure. It is a measure of the pressure in your arteries as your heart beats. The second ("bottom") number is called the diastolic pressure. It is a measure of the pressure in your arteries as the heart relaxes. What are the causes? The exact cause of this condition is not known. There are some conditions that result in high blood pressure. What increases the risk? Certain factors may make you more likely to develop high blood pressure. Some of these risk factors are under your control, including: Smoking. Not getting enough exercise or physical activity. Being overweight. Having too much fat, sugar, calories, or salt (sodium) in your diet. Drinking too much alcohol. Other risk factors include: Having a personal history of heart disease, diabetes, high cholesterol, or kidney disease. Stress. Having a family history of high blood pressure and high cholesterol. Having obstructive sleep apnea. Age. The risk increases with age. What are the signs or symptoms? High blood pressure may not cause symptoms. Very high blood pressure (hypertensive crisis) may cause: Headache. Fast or irregular heartbeats (palpitations). Shortness of breath. Nosebleed. Nausea and vomiting. Vision changes. Severe chest pain, dizziness, and seizures. How is this diagnosed? This condition is diagnosed by  measuring your blood pressure while you are seated, with your arm resting on a flat surface, your legs uncrossed, and your feet flat on the floor. The cuff of the blood pressure monitor will be placed directly against the skin of your upper arm at the level of your heart. Blood pressure should be measured at least twice using the same arm. Certain conditions can cause a difference in blood pressure between your right and left arms. If you have a high blood pressure reading during one visit or you have normal blood pressure with other risk factors, you may be asked to: Return on a different day to have your blood pressure checked again. Monitor your blood pressure at home for 1 week or longer. If you are diagnosed with hypertension, you may have other blood or imaging tests to help your health care provider understand your overall risk for other conditions. How is this treated? This condition is treated by making healthy lifestyle changes, such as eating healthy foods, exercising more, and reducing your alcohol intake. You may be referred for counseling on a healthy diet and physical activity. Your health care provider may prescribe medicine if lifestyle changes are not enough to get your blood pressure under control and if: Your systolic blood pressure is above 130. Your diastolic blood pressure is above 80. Your personal target blood pressure may vary depending on your medical conditions, your age, and other factors. Follow these instructions at home: Eating and drinking  Eat a diet that is high in fiber and potassium, and low in sodium, added sugar, and fat. An example of this eating plan is called the DASH diet. DASH stands for Dietary Approaches to Stop Hypertension. To eat this way: Eat   plenty of fresh fruits and vegetables. Try to fill one half of your plate at each meal with fruits and vegetables. Eat whole grains, such as whole-wheat pasta, brown rice, or whole-grain bread. Fill about one  fourth of your plate with whole grains. Eat or drink low-fat dairy products, such as skim milk or low-fat yogurt. Avoid fatty cuts of meat, processed or cured meats, and poultry with skin. Fill about one fourth of your plate with lean proteins, such as fish, chicken without skin, beans, eggs, or tofu. Avoid pre-made and processed foods. These tend to be higher in sodium, added sugar, and fat. Reduce your daily sodium intake. Many people with hypertension should eat less than 1,500 mg of sodium a day. Do not drink alcohol if: Your health care provider tells you not to drink. You are pregnant, may be pregnant, or are planning to become pregnant. If you drink alcohol: Limit how much you have to: 0-1 drink a day for women. 0-2 drinks a day for men. Know how much alcohol is in your drink. In the [place] one drink equals one 12 oz bottle of beer (355 mL), one 5 oz glass of wine (148 mL), or one 1 oz glass of hard liquor (44 mL). Lifestyle  Work with your health care provider to maintain a healthy body weight or to lose weight. Ask what an ideal weight is for you. Get at least 30 minutes of exercise that causes your heart to beat faster (aerobic exercise) most days of the week. Activities may include walking, swimming, or biking. Include exercise to strengthen your muscles (resistance exercise), such as Pilates or lifting weights, as part of your weekly exercise routine. Try to do these types of exercises for 30 minutes at least 3 days a week. Do not use any products that contain nicotine or tobacco. These products include cigarettes, chewing tobacco, and vaping devices, such as e-cigarettes. If you need help quitting, ask your health care provider. Monitor your blood pressure at home as told by your health care provider. Keep all follow-up visits. This is important. Medicines Take over-the-counter and prescription medicines only as told by your health care provider. Follow directions carefully. Blood  pressure medicines must be taken as prescribed. Do not skip doses of blood pressure medicine. Doing this puts you at risk for problems and can make the medicine less effective. Ask your health care provider about side effects or reactions to medicines that you should watch for. Contact a health care provider if you: Think you are having a reaction to a medicine you are taking. Have headaches that keep coming back (recurring). Feel dizzy. Have swelling in your ankles. Have trouble with your vision. Get help right away if you: Develop a severe headache or confusion. Have unusual weakness or numbness. Feel faint. Have severe pain in your chest or abdomen. Vomit repeatedly. Have trouble breathing. These symptoms may be an emergency. Get help right away. Call 911. Do not wait to see if the symptoms will go away. Do not drive yourself to the hospital. Summary Hypertension is when the force of blood pumping through your arteries is too strong. If this condition is not controlled, it may put you at risk for serious complications. Your personal target blood pressure may vary depending on your medical conditions, your age, and other factors. For most people, a normal blood pressure is less than 120/80. Hypertension is treated with lifestyle changes, medicines, or a combination of both. Lifestyle changes include losing weight, eating a healthy,   low-sodium diet, exercising more, and limiting alcohol. This information is not intended to replace advice given to you by your health care provider. Make sure you discuss any questions you have with your health care provider. Document Revised: 11/29/2020 Document Reviewed: 11/29/2020 Elsevier Patient Education  2023 [employer] Inc[employer]  

## 2022-06-12 NOTE — Assessment & Plan Note (Addendum)
Active and affecting quality of life X-ray done today.  Report reviewed. Needs orthopedic evaluation Referral placed today Recommend meloxicam 7.5 mg daily as needed

## 2022-06-12 NOTE — Assessment & Plan Note (Signed)
Stable chronic condition Normal lipid profile 3 months ago Continue atorvastatin 40 mg daily Diet and nutrition discussed

## 2022-06-18 NOTE — Progress Notes (Unsigned)
   Rubin Payor, PhD, LAT, ATC acting as a scribe for Clementeen Graham, MD.  Subjective:    CC: L knee pain  HPI: Pt is a 62 y/o female c/o L knee pain ongoing for over a year. Pt locates pain to ***  L Knee swelling: Mechanical symptoms: Aggravates: Treatments tried:  Dx imaging: 06/12/22 L knee XR  Pertinent review of Systems: ***  Relevant historical information: ***   Objective:   There were no vitals filed for this visit. General: Well Developed, well nourished, and in no acute distress.   MSK: ***  Lab and Radiology Results No results found for this or any previous visit (from the past 72 hour(s)). No results found.    Impression and Recommendations:    Assessment and Plan: 62 y.o. female with ***.  PDMP not reviewed this encounter. No orders of the defined types were placed in this encounter.  No orders of the defined types were placed in this encounter.   Discussed warning signs or symptoms. Please see discharge instructions. Patient expresses understanding.   ***

## 2022-06-19 ENCOUNTER — Ambulatory Visit (INDEPENDENT_AMBULATORY_CARE_PROVIDER_SITE_OTHER): Payer: Medicaid Other | Admitting: Family Medicine

## 2022-06-19 ENCOUNTER — Encounter: Payer: Self-pay | Admitting: Family Medicine

## 2022-06-19 ENCOUNTER — Other Ambulatory Visit: Payer: Self-pay | Admitting: Emergency Medicine

## 2022-06-19 ENCOUNTER — Other Ambulatory Visit: Payer: Self-pay

## 2022-06-19 VITALS — BP 104/72 | HR 74 | Ht 59.0 in | Wt 134.6 lb

## 2022-06-19 DIAGNOSIS — R269 Unspecified abnormalities of gait and mobility: Secondary | ICD-10-CM

## 2022-06-19 DIAGNOSIS — G8929 Other chronic pain: Secondary | ICD-10-CM

## 2022-06-19 DIAGNOSIS — M25562 Pain in left knee: Secondary | ICD-10-CM | POA: Diagnosis not present

## 2022-06-19 DIAGNOSIS — G8114 Spastic hemiplegia affecting left nondominant side: Secondary | ICD-10-CM | POA: Diagnosis not present

## 2022-06-19 DIAGNOSIS — M24542 Contracture, left hand: Secondary | ICD-10-CM | POA: Diagnosis not present

## 2022-06-19 NOTE — Patient Instructions (Addendum)
Thank you for coming in today.   You received an injection today. Seek immediate medical attention if the joint becomes red, extremely painful, or is oozing fluid.   I've referred you to Physical Therapy.  Let us know if you don't hear from them in one week.   Please use Voltaren gel (Generic Diclofenac Gel) up to 4x daily for pain as needed.  This is available over-the-counter as both the name brand Voltaren gel and the generic diclofenac gel.   Check back in 6 weeks

## 2022-06-27 ENCOUNTER — Ambulatory Visit: Payer: Medicaid Other | Attending: Family Medicine | Admitting: Physical Therapy

## 2022-06-27 ENCOUNTER — Telehealth: Payer: Self-pay | Admitting: Physical Therapy

## 2022-06-27 DIAGNOSIS — M24542 Contracture, left hand: Secondary | ICD-10-CM | POA: Insufficient documentation

## 2022-06-27 DIAGNOSIS — R2689 Other abnormalities of gait and mobility: Secondary | ICD-10-CM | POA: Diagnosis present

## 2022-06-27 DIAGNOSIS — M6281 Muscle weakness (generalized): Secondary | ICD-10-CM | POA: Diagnosis present

## 2022-06-27 DIAGNOSIS — I69354 Hemiplegia and hemiparesis following cerebral infarction affecting left non-dominant side: Secondary | ICD-10-CM

## 2022-06-27 DIAGNOSIS — R2681 Unsteadiness on feet: Secondary | ICD-10-CM | POA: Insufficient documentation

## 2022-06-27 DIAGNOSIS — R269 Unspecified abnormalities of gait and mobility: Secondary | ICD-10-CM | POA: Insufficient documentation

## 2022-06-27 DIAGNOSIS — G8114 Spastic hemiplegia affecting left nondominant side: Secondary | ICD-10-CM | POA: Diagnosis not present

## 2022-06-27 NOTE — Telephone Encounter (Signed)
Dr. Denyse Amass,   Barbara Strickland was evaluated by PT today and would benefit from an OT evaluation for L hand contracture.    If you agree, please place an order in [facility]que in [facility] or fax the order to [id number].  Thank you, Josephine Igo, PT, DPT [facility] [address] [place], [place]  [address] Phone:  [phone] Fax:  [phone]

## 2022-06-27 NOTE — Therapy (Signed)
OUTPATIENT PHYSICAL THERAPY NEURO EVALUATION   Patient Name: Barbara Strickland MRN: [id number] DOB:Mar 21, 1960, 62 y.o., female Today's Date: 06/27/2022   PCP: Georgina Quint, MD REFERRING PROVIDER: Rodolph Bong, MD  END OF SESSION:  PT End of Session - 06/27/22 0939     Visit Number 1    Number of Visits 7   Plus eval   Date for PT Re-Evaluation 08/15/22    Authorization Type [place] Medicaid    PT Start Time [id number]   Pt using restroom   PT Stop Time 1005    PT Time Calculation (min) 28 min    Activity Tolerance Patient tolerated treatment well    Behavior During Therapy [facility] for tasks assessed/performed             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[facility])    Hemorrhagic stroke (HCC) 2009   History of kidney stones    Hx of adenomatous colonic polyps 08/05/2017   Hypertension    Illiterate    pt cannot read/write english, daughter chien h signs consents   Left spastic hemiparesis (HCC)    uses cane   Microcytic anemia    Tubular adenoma of colon    Past Surgical History:  Procedure Laterality Date   CYSTOSCOPY W/ URETERAL STENT PLACEMENT Right 01/03/2020   Procedure: CYSTOSCOPY WITH RETROGRADE PYELOGRAM/URETERAL STENT PLACEMENT;  Surgeon: Rene Paci, MD;  Location: [facility];  Service: Urology;  Laterality: Right;   CYSTOSCOPY/RETROGRADE/URETEROSCOPY/STONE EXTRACTION WITH BASKET Right 05/04/2020   Procedure: CYSTOSCOPY/RETROGRADE/URETEROSCOPY/ HOLMIUM LASER LITHOTRIPSY/STONE EXTRACTION WITH BASKET/ STENT EXCHANGE;  Surgeon: Rene Paci, MD;  Location: [facility];  Service: Urology;  Laterality: Right;   CYSTOSCOPY/URETEROSCOPY/HOLMIUM LASER/STENT PLACEMENT Right 03/09/2020   Procedure: CYSTOSCOPY/RETROGRADE/URETEROSCOPY/HOLMIUM LASER/STENT REPLACEMENT;  Surgeon: Rene Paci, MD;  Location: [facility];  Service: Urology;  Laterality: Right;    CYSTOSCOPY/URETEROSCOPY/HOLMIUM LASER/STENT PLACEMENT Right 03/23/2020   Procedure: CYSTOSCOPY/STENT REMOVAL, URETEROSCOPY/HOLMIUM LASER/STENT PLACEMENT;  Surgeon: Rene Paci, MD;  Location: [facility];  Service: Urology;  Laterality: Right;   HOLMIUM LASER APPLICATION Right 05/04/2020   Procedure: HOLMIUM LASER APPLICATION;  Surgeon: Rene Paci, MD;  Location: [facility];  Service: Urology;  Laterality: Right;   Patient Active Problem List   Diagnosis Date Noted   Chronic pain of left knee 06/12/2022   Prediabetes 03/14/2021   History of stroke 09/05/2020   History of kidney stones 09/05/2020   Essential hypertension 01/03/2020   Prolonged QT interval 01/03/2020   Dyslipidemia 11/03/2019   Hx of adenomatous colonic polyps 08/05/2017   Left spastic hemiparesis (HCC) 05/15/2017   Essential hypertension 06/21/2015    ONSET DATE: 06/19/2022 (referral)   REFERRING DIAG: R26.9 (ICD-10-CM) - Abnormality of gait M24.542 (ICD-10-CM) - Contracture of hand joint, left G81.14 (ICD-10-CM) - Left spastic hemiparesis (HCC)  THERAPY DIAG:  Hemiplegia and hemiparesis following cerebral infarction affecting left non-dominant side (HCC)  Other abnormalities of gait and mobility  Muscle weakness (generalized)  Unsteadiness on feet  Rationale for Evaluation and Treatment: Rehabilitation  SUBJECTIVE:  SUBJECTIVE STATEMENT: Pt presents w/daughter who interprets for pt as no interpreter available. Daughter reports pt had a stroke about 15 years ago, has been using a cane for community ambulation ever since. Denies recent falls, did have quite a few when stroke first happened. Has history of left knee and ankle pain, received injection to knee last week which  has helped.    Pt accompanied by: interpreter: Daughter   PERTINENT HISTORY: Hemorrhagic stroke, uncontrolled HTN  PAIN:  Are you having pain? Yes: NPRS scale: 10/10 Pain location: L ankle Pain description: Achy Aggravating factors: Walking too much Relieving factors: sitting down   PRECAUTIONS: Fall  WEIGHT BEARING RESTRICTIONS: No  FALLS: Has patient fallen in last 6 months? No  LIVING ENVIRONMENT: Lives with: lives with their daughter Lives in: House/apartment Stairs: Yes: External: 3 steps; on right going up, on left going up, and can reach both Has following equipment at home: Single point cane and Grab bars  PLOF: Requires assistive device for independence and Needs assistance with homemaking  PATIENT GOALS: "To make my left body feel better"   OBJECTIVE:   DIAGNOSTIC FINDINGS: CT of head from 2009:  IMPRESSION:   1. Slight interval increase in large acute hemorrhage in the right basal ganglia.  2. Slight interval increase in right to left midline shift measuring 3 mm compared to less than 1 on prior.  3. No evidence of hydrocephalus and unchanged compression of the right lateral ventricle.   COGNITION: Overall cognitive status: Difficulty to assess due to: Communication impairment and language barrier   SENSATION: Pt reports numbness/tingling in her entire LLE  and L hand    MUSCLE TONE: LLE: Hypertonic and flaccid L hand    POSTURE: rounded shoulders and forward head   LOWER EXTREMITY MMT:  Tested in seated position (difficult to assess due to language barrier)   MMT Right Eval Left Eval  Hip flexion 4- 1  Hip extension    Hip abduction 4 2-  Hip adduction 4 2-  Hip internal rotation    Hip external rotation    Knee flexion 3+ 3  Knee extension 4+ 3-  Ankle dorsiflexion 4- 0  Ankle plantarflexion    Ankle inversion    Ankle eversion    (Blank rows = not tested)  BED MOBILITY:  Independent per pt   TRANSFERS: Assistive device utilized:  None  Sit to stand: CGA Stand to sit: CGA Heavy shift to RLE w/RUE support. Very little weight on LLE and LLE maintained in extension    GAIT: Gait pattern: step to pattern, decreased step length- Left, decreased stride length, decreased hip/knee flexion- Left, decreased ankle dorsiflexion- Left, circumduction- Left, antalgic, lateral lean- Right, and poor foot clearance- Left Distance walked: Various clinic distances  Assistive device utilized: Single point cane Level of assistance: CGA and Min A Comments: Pt required single instance of min A due to tripping over LLE.   FUNCTIONAL TESTS:   [facility] PT Assessment - 06/27/22 0957       Transfers   Five time sit to stand comments  25.5s   RUE support, heavy shift to R side     Ambulation/Gait   Gait velocity 32.8' over 37.62s= 0.87 ft/s w/SPC   min A due to single anterior LOB due to L foot catching              TODAY'S TREATMENT:       Next Session  PATIENT EDUCATION: Education details: POC, eval findings,  information on AFOs and pt in agreement to try next session  Person educated: Patient and Child(ren) Education method: Explanation and Demonstration Education comprehension: verbalized understanding and needs further education  HOME EXERCISE PROGRAM: To be established   GOALS: Goals reviewed with patient? Yes  SHORT TERM GOALS: Target date: 07/25/2022   Pt will be independent with initial HEP for improved strength, balance, transfers and gait.  Baseline: not established on eval  Goal status: INITIAL  2.  Pt will improve gait velocity to at least 1.0 ft/s w/LRAD and SBA for improved gait efficiency   Baseline: 0.87 ft/s w/SPC and CGA-min A  Goal status: INITIAL  3.  Pt will trial various braces in clinic on LLE to determine safest option that promotes independence w/gait  Baseline:   Goal status: INITIAL   LONG TERM GOALS: Target date: 08/08/2022   Pt will be independent with final HEP for improved strength, balance, transfers and gait.  Baseline:  Goal status: INITIAL  2.  Pt will improve gait velocity to at least 1.5 ft/s w/LRAD and SBA for improved gait efficiency and independence   Baseline: 0.87 ft/s w/SPC and CGA-min A Goal status: INITIAL  3.  Pt will improve 5 x STS to less than or equal to 20 seconds w/weight shift to L side to demonstrate improved functional strength and transfer efficiency.   Baseline: 25.5s w/RUE support, no shift to L side  Goal status: INITIAL   ASSESSMENT:  CLINICAL IMPRESSION: Patient is a 62 year old female referred to Neuro OPPT for L spastic hemiparesis. Pt's PMH is significant for: uncontrolled hypertension. The following deficits were present during the exam: decreased strength, impaired balance, decreased ROM, impaired sensation, impaired gait kinematics and decreased activity tolerance. Based on gait speed and 5x STS, pt is an incr risk for falls. Pt would benefit from skilled PT to address these impairments and functional limitations to maximize functional mobility independence.    OBJECTIVE IMPAIRMENTS: Abnormal gait, decreased balance, decreased coordination, decreased knowledge of use of DME, decreased mobility, difficulty walking, decreased ROM, decreased strength, impaired sensation, impaired tone, impaired UE functional use, and pain.   ACTIVITY LIMITATIONS: carrying, lifting, squatting, stairs, reach over head, hygiene/grooming, locomotion level, and caring for others  PARTICIPATION LIMITATIONS: cleaning, laundry, interpersonal relationship, driving, shopping, community activity, and yard work  PERSONAL FACTORS: Age, Education, Fitness, Past/current experiences, and 1 comorbidity: HTN  are also affecting patient's functional outcome.   REHAB POTENTIAL: Fair due to chronicity of CVA  CLINICAL DECISION MAKING:  Evolving/moderate complexity  EVALUATION COMPLEXITY: Moderate  PLAN:  PT FREQUENCY: 1x/week  PT DURATION: 6 weeks  PLANNED INTERVENTIONS: Therapeutic exercises, Therapeutic activity, Neuromuscular re-education, Balance training, Gait training, Patient/Family education, Self Care, Joint mobilization, Stair training, Orthotic/Fit training, DME instructions, Aquatic Therapy, Dry Needling, Electrical stimulation, Manual therapy, and Re-evaluation  PLAN FOR NEXT SESSION: Trial various AFOs on LLE. HEP for sit <>stands and to promote weight shift to L side. Toe taps, LLE NMR    Dierdre Mccalip E Taksh Hjort, PT, DPT 06/27/2022, 10:09 AM

## 2022-06-28 NOTE — Telephone Encounter (Signed)
Referral placed to OT for the hand as well.

## 2022-07-02 ENCOUNTER — Other Ambulatory Visit: Payer: Self-pay | Admitting: Emergency Medicine

## 2022-07-02 DIAGNOSIS — G8929 Other chronic pain: Secondary | ICD-10-CM

## 2022-07-03 ENCOUNTER — Ambulatory Visit: Payer: Medicaid Other | Admitting: Physical Therapy

## 2022-07-05 ENCOUNTER — Ambulatory Visit: Payer: Medicaid Other | Admitting: Physical Therapy

## 2022-07-05 DIAGNOSIS — R2681 Unsteadiness on feet: Secondary | ICD-10-CM

## 2022-07-05 DIAGNOSIS — I69354 Hemiplegia and hemiparesis following cerebral infarction affecting left non-dominant side: Secondary | ICD-10-CM

## 2022-07-05 DIAGNOSIS — R2689 Other abnormalities of gait and mobility: Secondary | ICD-10-CM

## 2022-07-05 NOTE — Therapy (Signed)
OUTPATIENT PHYSICAL THERAPY NEURO TREATMENT   Patient Name: Barbara Strickland MRN: [id number] DOB:1960-11-26, 62 y.o., female Today's Date: 07/05/2022   PCP: Georgina Quint, MD REFERRING PROVIDER: Rodolph Bong, MD  END OF SESSION:  PT End of Session - 07/05/22 0805     Visit Number 2    Number of Visits 7   Plus eval   Date for PT Re-Evaluation 08/15/22    Authorization Type [place] Medicaid    PT Start Time 0802    PT Stop Time 0845    PT Time Calculation (min) 43 min    Equipment Utilized During Treatment Gait belt    Activity Tolerance Patient tolerated treatment well    Behavior During Therapy [facility] for tasks assessed/performed              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[facility])    Hemorrhagic stroke (HCC) 2009   History of kidney stones    Hx of adenomatous colonic polyps 08/05/2017   Hypertension    Illiterate    pt cannot read/write english, daughter chien h signs consents   Left spastic hemiparesis (HCC)    uses cane   Microcytic anemia    Tubular adenoma of colon    Past Surgical History:  Procedure Laterality Date   CYSTOSCOPY W/ URETERAL STENT PLACEMENT Right 01/03/2020   Procedure: CYSTOSCOPY WITH RETROGRADE PYELOGRAM/URETERAL STENT PLACEMENT;  Surgeon: Rene Paci, MD;  Location: [facility];  Service: Urology;  Laterality: Right;   CYSTOSCOPY/RETROGRADE/URETEROSCOPY/STONE EXTRACTION WITH BASKET Right 05/04/2020   Procedure: CYSTOSCOPY/RETROGRADE/URETEROSCOPY/ HOLMIUM LASER LITHOTRIPSY/STONE EXTRACTION WITH BASKET/ STENT EXCHANGE;  Surgeon: Rene Paci, MD;  [facility];  Service: Urology;  Laterality: Right;   CYSTOSCOPY/URETEROSCOPY/HOLMIUM LASER/STENT PLACEMENT Right 03/09/2020   Procedure: CYSTOSCOPY/RETROGRADE/URETEROSCOPY/HOLMIUM LASER/STENT REPLACEMENT;  Surgeon: Rene Paci, MD;  Location: [facility];  Service:  Urology;  Laterality: Right;   CYSTOSCOPY/URETEROSCOPY/HOLMIUM LASER/STENT PLACEMENT Right 03/23/2020   Procedure: CYSTOSCOPY/STENT REMOVAL, URETEROSCOPY/HOLMIUM LASER/STENT PLACEMENT;  Surgeon: Rene Paci, MD;  Location: [facility];  Service: Urology;  Laterality: Right;   HOLMIUM LASER APPLICATION Right 05/04/2020   Procedure: HOLMIUM LASER APPLICATION;  Surgeon: Rene Paci, MD;  [facility];  Service: Urology;  Laterality: Right;   Patient Active Problem List   Diagnosis Date Noted   Chronic pain of left knee 06/12/2022   Prediabetes 03/14/2021   History of stroke 09/05/2020   History of kidney stones 09/05/2020   Essential hypertension 01/03/2020   Prolonged QT interval 01/03/2020   Dyslipidemia 11/03/2019   Hx of adenomatous colonic polyps 08/05/2017   Left spastic hemiparesis (HCC) 05/15/2017   Essential hypertension 06/21/2015    ONSET DATE: 06/19/2022 (referral)   REFERRING DIAG: R26.9 (ICD-10-CM) - Abnormality of gait M24.542 (ICD-10-CM) - Contracture of hand joint, left G81.14 (ICD-10-CM) - Left spastic hemiparesis (HCC)  THERAPY DIAG:  Hemiplegia and hemiparesis following cerebral infarction affecting left non-dominant side (HCC)  Other abnormalities of gait and mobility  Unsteadiness on feet  Rationale for Evaluation and Treatment: Rehabilitation  SUBJECTIVE:  SUBJECTIVE STATEMENT: Pt presents w/daughter who interprets for pt as no interpreter available. Pt reports her knee is feeling better today. Denies falls.   Pt accompanied by: interpreter: Daughter   PERTINENT HISTORY: Hemorrhagic stroke, uncontrolled HTN  PAIN:  Are you having pain? Yes: NPRS scale: 10/10 Pain location: L ankle Pain description:  Achy Aggravating factors: Walking too much Relieving factors: sitting down   PRECAUTIONS: Fall  WEIGHT BEARING RESTRICTIONS: No  FALLS: Has patient fallen in last 6 months? No  LIVING ENVIRONMENT: Lives with: lives with their daughter Lives in: House/apartment Stairs: Yes: External: 3 steps; on right going up, on left going up, and can reach both Has following equipment at home: Single point cane and Grab bars  PLOF: Requires assistive device for independence and Needs assistance with homemaking  PATIENT GOALS: "To make my left body feel better"   OBJECTIVE:   DIAGNOSTIC FINDINGS: CT of head from 2009:  IMPRESSION:   1. Slight interval increase in large acute hemorrhage in the right basal ganglia.  2. Slight interval increase in right to left midline shift measuring 3 mm compared to less than 1 on prior.  3. No evidence of hydrocephalus and unchanged compression of the right lateral ventricle.   COGNITION: Overall cognitive status: Difficulty to assess due to: Communication impairment and language barrier   SENSATION: Pt reports numbness/tingling in her entire LLE  and L hand    MUSCLE TONE: LLE: Hypertonic and flaccid L hand    POSTURE: rounded shoulders and forward head   LOWER EXTREMITY MMT:  Tested in seated position (difficult to assess due to language barrier)   MMT Right Eval Left Eval  Hip flexion 4- 1  Hip extension    Hip abduction 4 2-  Hip adduction 4 2-  Hip internal rotation    Hip external rotation    Knee flexion 3+ 3  Knee extension 4+ 3-  Ankle dorsiflexion 4- 0  Ankle plantarflexion    Ankle inversion    Ankle eversion    (Blank rows = not tested)  BED MOBILITY:  Independent per pt   TODAY'S TREATMENT:       Gait Training  Gait pattern: step to pattern, step through pattern, decreased arm swing- Left, decreased stride length, decreased hip/knee flexion- Left, decreased ankle dorsiflexion- Left, circumduction- Left, genu recurvatum-  Left, lateral lean- Right, decreased trunk rotation, wide BOS, and poor foot clearance- Left Distance walked: >200' (split going around clinic and in // bars) Assistive device utilized: Single point cane and [place] WalkOn AFO on LLE Level of assistance: CGA and HHA provided by daughter  Comments: Pt reports difficulty ambulating w/AFO as it minimizes her ability to circumduct her LLE and she would not walk without holding onto daughter. Had pt ambulate in // bars to use mirror for visual biofeedback on body position and facilitate knee flexion, but pt unable to do so.   Gait pattern: step to pattern, decreased arm swing- Right, decreased arm swing- Left, decreased stride length, decreased hip/knee flexion- Left, decreased ankle dorsiflexion- Left, circumduction- Left, genu recurvatum- Left, lateral hip instability, lateral lean- Right, wide BOS, and poor foot clearance- Left Distance walked: >200' (split going around clinic and in // bars) Assistive device utilized: Single point cane and [place] SpryStep AFO Level of assistance: CGA and HHA provided by daughter Comments: Trialed AFO w/lateral strut to see if pt liked this better. Pt did report increased comfort of this brace vs Ottobock but continued to express increased difficulty walking  due to difficulty w/circumduction in brace. At this time, will not pursue AFO as pt does not like it and it does not improve her gait kinematics.    NMR  In // bars for facilitation of hip/knee flexion on L side, toe taps to 4" step w/RUE support and min A at LLE, x10 reps while using mirror for visual biofeedback on body position. Performed activity w/[place] AFO donned to assist w/ankle control and foot drop. Pt unable to activate hamstrings despite tactile and visual cues and relied heavily on R lateral lean to attempt to perform movement.  On mat table, supine glute bridges w/min A for LLE management, 2x5 reps, for improved posterior chain strength. Pt unable  to clear hips from mat table and shifted weight to R side. Pt very challenged by this Seated heel slides on LLE w/min-mod A for HS facilitation, x12 reps. Pt unable to elicit active knee flexion initially but w/max tactile cues applied to hamstrings by therapist, pt able to obtain 1/5 MMT of hamstrings to perform. Encouraged pt to perform this at home (unable to provide HEP in pt's language). Pt verbalized understanding.     PATIENT EDUCATION: Education details: Performing heel slides at home, plan to use Bioness next session - informed pt to wear shorts Person educated: Patient and Child(ren) Education method: Medical illustrator Education comprehension: verbalized understanding  HOME EXERCISE PROGRAM: Unable to provide in pt's language and daughter unable to help pt perform at home   GOALS: Goals reviewed with patient? Yes  SHORT TERM GOALS: Target date: 07/25/2022   Pt will be independent with initial HEP for improved strength, balance, transfers and gait.  Baseline: not established on eval  Goal status: INITIAL  2.  Pt will improve gait velocity to at least 1.0 ft/s w/LRAD and SBA for improved gait efficiency   Baseline: 0.87 ft/s w/SPC and CGA-min A  Goal status: INITIAL  3.  Pt will trial various braces in clinic on LLE to determine safest option that promotes independence w/gait  Baseline: trialed on 5/30- will not pursue  Goal status: MET   LONG TERM GOALS: Target date: 08/08/2022   Pt will be independent with final HEP for improved strength, balance, transfers and gait.  Baseline:  Goal status: INITIAL  2.  Pt will improve gait velocity to at least 1.5 ft/s w/LRAD and SBA for improved gait efficiency and independence   Baseline: 0.87 ft/s w/SPC and CGA-min A Goal status: INITIAL  3.  Pt will improve 5 x STS to less than or equal to 20 seconds w/weight shift to L side to demonstrate improved functional strength and transfer efficiency.   Baseline:  25.5s w/RUE support, no shift to L side  Goal status: INITIAL   ASSESSMENT:  CLINICAL IMPRESSION: Emphasis of skilled PT session on gait training w/various AFOs and facilitating knee flexion of LLE. Pt does not like AFOs and was unable to walk without holding onto daughter w/both medial and lateral strut AFO due to circumduction restriction and feeling unsteady. At this time, will not pursue AFO as pt's gait kinematics have been present for >14 years. Pt continues to have difficulty eliciting knee flexion on L side but did respond well to application of deep pressure to hamstrings. Plan to trial Bioness next session. Unable to fully provide HEP to pt due to language barrier and lack of assistance at home to perform. Continue POC.    OBJECTIVE IMPAIRMENTS: Abnormal gait, decreased balance, decreased coordination, decreased knowledge of use of  DME, decreased mobility, difficulty walking, decreased ROM, decreased strength, impaired sensation, impaired tone, impaired UE functional use, and pain.   ACTIVITY LIMITATIONS: carrying, lifting, squatting, stairs, reach over head, hygiene/grooming, locomotion level, and caring for others  PARTICIPATION LIMITATIONS: cleaning, laundry, interpersonal relationship, driving, shopping, community activity, and yard work  PERSONAL FACTORS: Age, Education, Fitness, Past/current experiences, and 1 comorbidity: HTN  are also affecting patient's functional outcome.   REHAB POTENTIAL: Fair due to chronicity of CVA  CLINICAL DECISION MAKING: Evolving/moderate complexity  EVALUATION COMPLEXITY: Moderate  PLAN:  PT FREQUENCY: 1x/week  PT DURATION: 6 weeks  PLANNED INTERVENTIONS: Therapeutic exercises, Therapeutic activity, Neuromuscular re-education, Balance training, Gait training, Patient/Family education, Self Care, Joint mobilization, Stair training, Orthotic/Fit training, DME instructions, Aquatic Therapy, Dry Needling, Electrical stimulation, Manual therapy,  and Re-evaluation  PLAN FOR NEXT SESSION: Bioness. HEP for sit <>stands and to promote weight shift to L side. Toe taps, LLE NMR, anything to facilitate L knee flexion    Indio Santilli E Rasheen Schewe, PT, DPT 07/05/2022, 8:46 AM

## 2022-07-10 ENCOUNTER — Other Ambulatory Visit: Payer: Self-pay | Admitting: Emergency Medicine

## 2022-07-10 DIAGNOSIS — G8929 Other chronic pain: Secondary | ICD-10-CM

## 2022-07-11 ENCOUNTER — Ambulatory Visit: Payer: Medicaid Other | Admitting: Physical Therapy

## 2022-07-12 ENCOUNTER — Ambulatory Visit: Payer: Medicaid Other | Attending: Family Medicine | Admitting: Physical Therapy

## 2022-07-12 DIAGNOSIS — R2689 Other abnormalities of gait and mobility: Secondary | ICD-10-CM | POA: Diagnosis present

## 2022-07-12 DIAGNOSIS — M6281 Muscle weakness (generalized): Secondary | ICD-10-CM | POA: Insufficient documentation

## 2022-07-12 DIAGNOSIS — R2681 Unsteadiness on feet: Secondary | ICD-10-CM | POA: Insufficient documentation

## 2022-07-12 DIAGNOSIS — I69354 Hemiplegia and hemiparesis following cerebral infarction affecting left non-dominant side: Secondary | ICD-10-CM | POA: Insufficient documentation

## 2022-07-12 DIAGNOSIS — M24542 Contracture, left hand: Secondary | ICD-10-CM | POA: Insufficient documentation

## 2022-07-12 NOTE — Therapy (Signed)
OUTPATIENT PHYSICAL THERAPY NEURO TREATMENT   Patient Name: Barbara Strickland MRN: [id number] DOB:[birth year], 62 y.o., female Today's Date: 07/12/2022   PCP: Georgina Quint, MD REFERRING PROVIDER: Rodolph Bong, MD  END OF SESSION:  PT End of Session - 07/12/22 0803     Visit Number 3    Number of Visits 7   Plus eval   Date for PT Re-Evaluation 08/15/22    Authorization Type [place] Medicaid    PT Start Time 0801    PT Stop Time 0844    PT Time Calculation (min) 43 min    Equipment Utilized During Treatment Gait belt;Other (comment)   Bioness   Activity Tolerance Patient tolerated treatment well    Behavior During Therapy [facility] for tasks assessed/performed               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[facility])    Hemorrhagic stroke (HCC) 2009   History of kidney stones    Hx of adenomatous colonic polyps 08/05/2017   Hypertension    Illiterate    pt cannot read/write english, daughter chien h signs consents   Left spastic hemiparesis (HCC)    uses cane   Microcytic anemia    Tubular adenoma of colon    Past Surgical History:  Procedure Laterality Date   CYSTOSCOPY W/ URETERAL STENT PLACEMENT Right 01/03/2020   Procedure: CYSTOSCOPY WITH RETROGRADE PYELOGRAM/URETERAL STENT PLACEMENT;  Surgeon: Rene Paci, MD;  Location: [facility];  Service: Urology;  Laterality: Right;   CYSTOSCOPY/RETROGRADE/URETEROSCOPY/STONE EXTRACTION WITH BASKET Right 05/04/2020   Procedure: CYSTOSCOPY/RETROGRADE/URETEROSCOPY/ HOLMIUM LASER LITHOTRIPSY/STONE EXTRACTION WITH BASKET/ STENT EXCHANGE;  Surgeon: Rene Paci, MD;  Location: [facility];  Service: Urology;  Laterality: Right;   CYSTOSCOPY/URETEROSCOPY/HOLMIUM LASER/STENT PLACEMENT Right 03/09/2020   Procedure: CYSTOSCOPY/RETROGRADE/URETEROSCOPY/HOLMIUM LASER/STENT REPLACEMENT;  Surgeon: Rene Paci, MD;  Location: [facility];  Service: Urology;  Laterality: Right;   CYSTOSCOPY/URETEROSCOPY/HOLMIUM LASER/STENT PLACEMENT Right 03/23/2020   Procedure: CYSTOSCOPY/STENT REMOVAL, URETEROSCOPY/HOLMIUM LASER/STENT PLACEMENT;  Surgeon: Rene Paci, MD;  Location: [facility];  Service: Urology;  Laterality: Right;   HOLMIUM LASER APPLICATION Right 05/04/2020   Procedure: HOLMIUM LASER APPLICATION;  Surgeon: Rene Paci, MD;  Location: [facility];  Service: Urology;  Laterality: Right;   Patient Active Problem List   Diagnosis Date Noted   Chronic pain of left knee 06/12/2022   Prediabetes 03/14/2021   History of stroke 09/05/2020   History of kidney stones 09/05/2020   Essential hypertension 01/03/2020   Prolonged QT interval 01/03/2020   Dyslipidemia 11/03/2019   Hx of adenomatous colonic polyps 08/05/2017   Left spastic hemiparesis (HCC) 05/15/2017   Essential hypertension 06/21/2015    ONSET DATE: 06/19/2022 (referral)   REFERRING DIAG: R26.9 (ICD-10-CM) - Abnormality of gait M24.542 (ICD-10-CM) - Contracture of hand joint, left G81.14 (ICD-10-CM) - Left spastic hemiparesis (HCC)  THERAPY DIAG:  Hemiplegia and hemiparesis following cerebral infarction affecting left non-dominant side (HCC)  Other abnormalities of gait and mobility  Muscle weakness (generalized)  Unsteadiness on feet  Rationale for Evaluation and Treatment: Rehabilitation  SUBJECTIVE:  SUBJECTIVE STATEMENT: Pt presents w/daughter and interpreter. Pt denies pain or acute changes, is doing well.   Pt accompanied by: interpreter: Daughter and Y'Keo    PERTINENT HISTORY: Hemorrhagic stroke, uncontrolled HTN  PAIN:  Are you having pain? No  PRECAUTIONS: Fall  WEIGHT BEARING RESTRICTIONS:  No  FALLS: Has patient fallen in last 6 months? No  LIVING ENVIRONMENT: Lives with: lives with their daughter Lives in: House/apartment Stairs: Yes: External: 3 steps; on right going up, on left going up, and can reach both Has following equipment at home: Single point cane and Grab bars  PLOF: Requires assistive device for independence and Needs assistance with homemaking  PATIENT GOALS: "To make my left body feel better"   OBJECTIVE:   DIAGNOSTIC FINDINGS: CT of head from 2009:  IMPRESSION:   1. Slight interval increase in large acute hemorrhage in the right basal ganglia.  2. Slight interval increase in right to left midline shift measuring 3 mm compared to less than 1 on prior.  3. No evidence of hydrocephalus and unchanged compression of the right lateral ventricle.   COGNITION: Overall cognitive status: Difficulty to assess due to: Communication impairment and language barrier   SENSATION: Pt reports numbness/tingling in her entire LLE  and L hand    MUSCLE TONE: LLE: Hypertonic and flaccid L hand    POSTURE: rounded shoulders and forward head   LOWER EXTREMITY MMT:  Tested in seated position (difficult to assess due to language barrier)   MMT Right Eval Left Eval  Hip flexion 4- 1  Hip extension    Hip abduction 4 2-  Hip adduction 4 2-  Hip internal rotation    Hip external rotation    Knee flexion 3+ 3  Knee extension 4+ 3-  Ankle dorsiflexion 4- 0  Ankle plantarflexion    Ankle inversion    Ankle eversion    (Blank rows = not tested)  BED MOBILITY:  Independent per pt   TODAY'S TREATMENT:       E-stim attended/NMR With two therapists, set up Bioness to L anterior tibialis and hamstrings to facilitate ankle DF and knee flexion w/gait. See tablet 1 for details.   Gait pattern: step through pattern, decreased hip/knee flexion- Left, circumduction- Left, and lateral lean- Right Distance walked: >1,000' indoors Assistive device utilized:  HHA  w/daughter Level of assistance: SBA Comments: Pt ambulated 2 laps around gym very cautiously, initially unable to flex L knee and relying heavily on leaning into daughter to perform circumduction despite max cues to facilitate TO position and knee flexion. L ankle DF well controlled w/Bioness, w/no catching of foot throughout session. Practiced walking in // bars (x10 reps down and back) w/min tactile cues applied to L knee to promote knee flexion initially and instructed pt to use mirror for visual biofeedback on body position. Pt able to facilitate knee flexion while in // bars w/practice, no longer requiring tactile cues. Noted more narrow BOS and minimal knee flexion without circumduction or lateral lean compensation. Pt then ambulated 5 laps around gym w/increased L knee flexion and reduced reliance on leaning on daughter for stability. Pt reported satisfaction w/Bioness and reduced pain in back w/gait, so will plan to use on TM next session to continue to promote knee flexion and reduced circumduction compensation.     PATIENT EDUCATION: Education details: Trying Bioness on TM next session Person educated: Patient and Child(ren) Education method: Medical illustrator Education comprehension: verbalized understanding  HOME EXERCISE PROGRAM: Unable to provide  in pt's language and daughter unable to help pt perform at home   GOALS: Goals reviewed with patient? Yes  SHORT TERM GOALS: Target date: 07/25/2022   Pt will be independent with initial HEP for improved strength, balance, transfers and gait.  Baseline: not established on eval  Goal status: INITIAL  2.  Pt will improve gait velocity to at least 1.0 ft/s w/LRAD and SBA for improved gait efficiency   Baseline: 0.87 ft/s w/SPC and CGA-min A  Goal status: INITIAL  3.  Pt will trial various braces in clinic on LLE to determine safest option that promotes independence w/gait  Baseline: trialed on 5/30- will not pursue  Goal  status: MET   LONG TERM GOALS: Target date: 08/08/2022   Pt will be independent with final HEP for improved strength, balance, transfers and gait.  Baseline:  Goal status: INITIAL  2.  Pt will improve gait velocity to at least 1.5 ft/s w/LRAD and SBA for improved gait efficiency and independence   Baseline: 0.87 ft/s w/SPC and CGA-min A Goal status: INITIAL  3.  Pt will improve 5 x STS to less than or equal to 20 seconds w/weight shift to L side to demonstrate improved functional strength and transfer efficiency.   Baseline: 25.5s w/RUE support, no shift to L side  Goal status: INITIAL   ASSESSMENT:  CLINICAL IMPRESSION: Emphasis of skilled PT session on gait training w/use of Bioness to facilitate L knee flexion and ankle DF. Pt had good response to use of Bioness, demonstrating DF to neutral and strong contraction of hamstrings. Pt initially hesitant to allow her knee to flex w/gait and maintained circumduction w/heavy reliance on daughter for stability. With use of visual and tactile cues, pt became more comfortable and able to flex her L knee and demonstrated reduced reliance on circumduction and increased step clearance w/more narrow BOS. Plan to use next session to continue facilitation of knee flexion and strengthen posterior chain. Continue POC.   OBJECTIVE IMPAIRMENTS: Abnormal gait, decreased balance, decreased coordination, decreased knowledge of use of DME, decreased mobility, difficulty walking, decreased ROM, decreased strength, impaired sensation, impaired tone, impaired UE functional use, and pain.   ACTIVITY LIMITATIONS: carrying, lifting, squatting, stairs, reach over head, hygiene/grooming, locomotion level, and caring for others  PARTICIPATION LIMITATIONS: cleaning, laundry, interpersonal relationship, driving, shopping, community activity, and yard work  PERSONAL FACTORS: Age, Education, Fitness, Past/current experiences, and 1 comorbidity: HTN  are also affecting  patient's functional outcome.   REHAB POTENTIAL: Fair due to chronicity of CVA  CLINICAL DECISION MAKING: Evolving/moderate complexity  EVALUATION COMPLEXITY: Moderate  PLAN:  PT FREQUENCY: 1x/week  PT DURATION: 6 weeks  PLANNED INTERVENTIONS: Therapeutic exercises, Therapeutic activity, Neuromuscular re-education, Balance training, Gait training, Patient/Family education, Self Care, Joint mobilization, Stair training, Orthotic/Fit training, DME instructions, Aquatic Therapy, Dry Needling, Electrical stimulation, Manual therapy, and Re-evaluation  PLAN FOR NEXT SESSION: Bioness. HEP for sit <>stands and to promote weight shift to L side. Toe taps, LLE NMR, anything to facilitate L knee flexion. TM   Jill Alexanders Jozette Castrellon, PT, DPT 07/12/2022, 8:45 AM

## 2022-07-18 ENCOUNTER — Ambulatory Visit: Payer: Medicaid Other | Admitting: Physical Therapy

## 2022-07-18 ENCOUNTER — Other Ambulatory Visit: Payer: Self-pay | Admitting: Emergency Medicine

## 2022-07-18 DIAGNOSIS — I69354 Hemiplegia and hemiparesis following cerebral infarction affecting left non-dominant side: Secondary | ICD-10-CM

## 2022-07-18 DIAGNOSIS — G8929 Other chronic pain: Secondary | ICD-10-CM

## 2022-07-18 DIAGNOSIS — R2681 Unsteadiness on feet: Secondary | ICD-10-CM

## 2022-07-18 DIAGNOSIS — R2689 Other abnormalities of gait and mobility: Secondary | ICD-10-CM

## 2022-07-18 DIAGNOSIS — M6281 Muscle weakness (generalized): Secondary | ICD-10-CM

## 2022-07-18 NOTE — Therapy (Signed)
OUTPATIENT PHYSICAL THERAPY NEURO TREATMENT   Patient Name: Stephany Stocks MRN: [id number] DOB:1960-03-20, 62 y.o., female Today's Date: 07/18/2022   PCP: Georgina Quint, MD REFERRING PROVIDER: Rodolph Bong, MD  END OF SESSION:  PT End of Session - 07/18/22 0852     Visit Number 4    Number of Visits 7   Plus eval   Date for PT Re-Evaluation 08/15/22    Authorization Type  Medicaid    PT Start Time 0849    PT Stop Time 0931    PT Time Calculation (min) 42 min    Equipment Utilized During Treatment Other (comment)   Bioness   Activity Tolerance Patient tolerated treatment well    Behavior During Therapy [facility] for tasks assessed/performed               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[facility])    Hemorrhagic stroke (HCC) 2009   History of kidney stones    Hx of adenomatous colonic polyps 08/05/2017   Hypertension    Illiterate    pt cannot read/write english, daughter chien h signs consents   Left spastic hemiparesis (HCC)    uses cane   Microcytic anemia    Tubular adenoma of colon    Past Surgical History:  Procedure Laterality Date   CYSTOSCOPY W/ URETERAL STENT PLACEMENT Right 01/03/2020   Procedure: CYSTOSCOPY WITH RETROGRADE PYELOGRAM/URETERAL STENT PLACEMENT;  Surgeon: Rene Paci, MD;  Location: [facility];  Service: Urology;  Laterality: Right;   CYSTOSCOPY/RETROGRADE/URETEROSCOPY/STONE EXTRACTION WITH BASKET Right 05/04/2020   Procedure: CYSTOSCOPY/RETROGRADE/URETEROSCOPY/ HOLMIUM LASER LITHOTRIPSY/STONE EXTRACTION WITH BASKET/ STENT EXCHANGE;  Surgeon: Rene Paci, MD;  Location: [facility];  Service: Urology;  Laterality: Right;   CYSTOSCOPY/URETEROSCOPY/HOLMIUM LASER/STENT PLACEMENT Right 03/09/2020   Procedure: CYSTOSCOPY/RETROGRADE/URETEROSCOPY/HOLMIUM LASER/STENT REPLACEMENT;  Surgeon: Rene Paci, MD;  Location: [facility];   Service: Urology;  Laterality: Right;   CYSTOSCOPY/URETEROSCOPY/HOLMIUM LASER/STENT PLACEMENT Right 03/23/2020   Procedure: CYSTOSCOPY/STENT REMOVAL, URETEROSCOPY/HOLMIUM LASER/STENT PLACEMENT;  Surgeon: Rene Paci, MD;  Location: [facility];  Service: Urology;  Laterality: Right;   HOLMIUM LASER APPLICATION Right 05/04/2020   Procedure: HOLMIUM LASER APPLICATION;  Surgeon: Rene Paci, MD;  Location: [facility];  Service: Urology;  Laterality: Right;   Patient Active Problem List   Diagnosis Date Noted   Chronic pain of left knee 06/12/2022   Prediabetes 03/14/2021   History of stroke 09/05/2020   History of kidney stones 09/05/2020   Essential hypertension 01/03/2020   Prolonged QT interval 01/03/2020   Dyslipidemia 11/03/2019   Hx of adenomatous colonic polyps 08/05/2017   Left spastic hemiparesis (HCC) 05/15/2017   Essential hypertension 06/21/2015    ONSET DATE: 06/19/2022 (referral)   REFERRING DIAG: R26.9 (ICD-10-CM) - Abnormality of gait M24.542 (ICD-10-CM) - Contracture of hand joint, left G81.14 (ICD-10-CM) - Left spastic hemiparesis (HCC)  THERAPY DIAG:  Hemiplegia and hemiparesis following cerebral infarction affecting left non-dominant side (HCC)  Other abnormalities of gait and mobility  Muscle weakness (generalized)  Unsteadiness on feet  Rationale for Evaluation and Treatment: Rehabilitation  SUBJECTIVE:  SUBJECTIVE STATEMENT: Pt presents with daughter and interpreter. Holding onto daughter for ambulation. Reports some knee pain   Pt accompanied by: interpreter: Daughter and Y'Bion    PERTINENT HISTORY: Hemorrhagic stroke, uncontrolled HTN  PAIN:  Are you having pain? Yes: NPRS scale: 6 (per daughter)/10 Pain  location: L knee Pain description: achy  PRECAUTIONS: Fall  WEIGHT BEARING RESTRICTIONS: No  FALLS: Has patient fallen in last 6 months? No  LIVING ENVIRONMENT: Lives with: lives with their daughter Lives in: House/apartment Stairs: Yes: External: 3 steps; on right going up, on left going up, and can reach both Has following equipment at home: Single point cane and Grab bars  PLOF: Requires assistive device for independence and Needs assistance with homemaking  PATIENT GOALS: "To make my left body feel better"   OBJECTIVE:   DIAGNOSTIC FINDINGS: CT of head from 2009:  IMPRESSION:   1. Slight interval increase in large acute hemorrhage in the right basal ganglia.  2. Slight interval increase in right to left midline shift measuring 3 mm compared to less than 1 on prior.  3. No evidence of hydrocephalus and unchanged compression of the right lateral ventricle.   COGNITION: Overall cognitive status: Difficulty to assess due to: Communication impairment and language barrier   SENSATION: Pt reports numbness/tingling in her entire LLE  and L hand    MUSCLE TONE: LLE: Hypertonic and flaccid L hand    POSTURE: rounded shoulders and forward head   LOWER EXTREMITY MMT:  Tested in seated position (difficult to assess due to language barrier)   MMT Right Eval Left Eval  Hip flexion 4- 1  Hip extension    Hip abduction 4 2-  Hip adduction 4 2-  Hip internal rotation    Hip external rotation    Knee flexion 3+ 3  Knee extension 4+ 3-  Ankle dorsiflexion 4- 0  Ankle plantarflexion    Ankle inversion    Ankle eversion    (Blank rows = not tested)  BED MOBILITY:  Independent per pt   TODAY'S TREATMENT:       E-stim attended/NMR Bioness set up to L anterior tibialis and hamstrings to facilitate ankle DF and knee flexion w/gait. See tablet 1 for details.   Gait pattern: step through pattern, decreased hip/knee flexion- Left, circumduction- Left, and lateral lean-  Right Distance walked: >400' indoors Assistive device utilized: Single point cane and HHA w/daughter Level of assistance: SBA Comments: Pt initially ambulated w/increased L knee flexion but quickly regressed to circumduction of LLE w/fatigue, requiring max verbal cues and manual block of LLE to minimize compensation. Pt ambulated 2 laps while holding onto daughter and 2 laps w/SPC. Pt able to facilitate most knee flexion when therapist standing close to L side due to inability to fully circumduct LLE.    The following treadmill training was completed for aerobic/neural priming, endurance, improved kinematics of LLE and weightbearing on LLE. Two therapists present to assist w/progression of LLE and to provide guarding  Pt ambulated 5:00 up to 0.4 mph w/RUE support on TM and min A at LLE, w/therapist providing tactile cue to L hamstring and blocking circumduction of LLE. Pt required max verbal cues for increased step length of RLE. Pt unable to facilitate increased step length of RLE without concurrent cues and regressed to circumduction of LLE when therapist reduced physical assistance. "This is hard"     PATIENT EDUCATION: Education details: Continue HEP, plan for next session Person educated: Patient and Child(ren) Education method: Explanation  and Demonstration Education comprehension: verbalized understanding  HOME EXERCISE PROGRAM: Unable to provide in pt's language and daughter unable to help pt perform at home   GOALS: Goals reviewed with patient? Yes  SHORT TERM GOALS: Target date: 07/25/2022   Pt will be independent with initial HEP for improved strength, balance, transfers and gait.  Baseline: not established on eval  Goal status: INITIAL  2.  Pt will improve gait velocity to at least 1.0 ft/s w/LRAD and SBA for improved gait efficiency   Baseline: 0.87 ft/s w/SPC and CGA-min A  Goal status: INITIAL  3.  Pt will trial various braces in clinic on LLE to determine safest  option that promotes independence w/gait  Baseline: trialed on 5/30- will not pursue  Goal status: MET   LONG TERM GOALS: Target date: 08/08/2022   Pt will be independent with final HEP for improved strength, balance, transfers and gait.  Baseline:  Goal status: INITIAL  2.  Pt will improve gait velocity to at least 1.5 ft/s w/LRAD and SBA for improved gait efficiency and independence   Baseline: 0.87 ft/s w/SPC and CGA-min A Goal status: INITIAL  3.  Pt will improve 5 x STS to less than or equal to 20 seconds w/weight shift to L side to demonstrate improved functional strength and transfer efficiency.   Baseline: 25.5s w/RUE support, no shift to L side  Goal status: INITIAL   ASSESSMENT:  CLINICAL IMPRESSION: Emphasis of skilled PT session on gait training w/Bioness for improved L knee flexion and reduced circumduction of LLE w/swing phase. Pt continues to have difficulty facilitating knee flexion w/Bioness, but does demonstrate increased ability to flex L knee when distracted, indicative of pt "fighting" the Bioness. Pt required min A for progression of LLE on TM due to narrow space and inability to circumduct LLE. Noted decreased weight acceptance on LLE this date, so will work on increased step length w/RLE next session. Continue POC.   OBJECTIVE IMPAIRMENTS: Abnormal gait, decreased balance, decreased coordination, decreased knowledge of use of DME, decreased mobility, difficulty walking, decreased ROM, decreased strength, impaired sensation, impaired tone, impaired UE functional use, and pain.   ACTIVITY LIMITATIONS: carrying, lifting, squatting, stairs, reach over head, hygiene/grooming, locomotion level, and caring for others  PARTICIPATION LIMITATIONS: cleaning, laundry, interpersonal relationship, driving, shopping, community activity, and yard work  PERSONAL FACTORS: Age, Education, Fitness, Past/current experiences, and 1 comorbidity: HTN  are also affecting patient's  functional outcome.   REHAB POTENTIAL: Fair due to chronicity of CVA  CLINICAL DECISION MAKING: Evolving/moderate complexity  EVALUATION COMPLEXITY: Moderate  PLAN:  PT FREQUENCY: 1x/week  PT DURATION: 6 weeks  PLANNED INTERVENTIONS: Therapeutic exercises, Therapeutic activity, Neuromuscular re-education, Balance training, Gait training, Patient/Family education, Self Care, Joint mobilization, Stair training, Orthotic/Fit training, DME instructions, Aquatic Therapy, Dry Needling, Electrical stimulation, Manual therapy, and Re-evaluation  PLAN FOR NEXT SESSION: Bioness. HEP for sit <>stands and to promote weight shift to L side. Toe taps, LLE NMR, anything to facilitate L knee flexion. TM. Weight acceptance on LLE   Finn Altemose E Cleaven Demario, PT, DPT 07/18/2022, 10:11 AM

## 2022-07-24 NOTE — Therapy (Signed)
OUTPATIENT OCCUPATIONAL THERAPY NEURO EVALUATION  Patient Name: Barbara Strickland MRN: [id number] DOB:[birth year], 62 y.o., female Today's Date: 07/25/2022  PCP: Georgina Quint, MD REFERRING PROVIDER: Rodolph Bong, MD  END OF SESSION:  OT End of Session - 07/25/22 0933     Visit Number 1    Number of Visits 5    Date for OT Re-Evaluation 08/24/22    Authorization Type [employer] MCD - No auth required    OT Start Time 0848    OT Stop Time 0930    OT Time Calculation (min) 42 min    Activity Tolerance Patient tolerated treatment well    Behavior During Therapy [facility] for tasks assessed/performed             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[facility])    Hemorrhagic stroke (HCC) 2009   History of kidney stones    Hx of adenomatous colonic polyps 08/05/2017   Hypertension    Illiterate    pt cannot read/write english, daughter chien h signs consents   Left spastic hemiparesis (HCC)    uses cane   Microcytic anemia    Tubular adenoma of colon    Past Surgical History:  Procedure Laterality Date   CYSTOSCOPY W/ URETERAL STENT PLACEMENT Right 01/03/2020   Procedure: CYSTOSCOPY WITH RETROGRADE PYELOGRAM/URETERAL STENT PLACEMENT;  Surgeon: Rene Paci, MD;  Location: [facility];  Service: Urology;  Laterality: Right;   CYSTOSCOPY/RETROGRADE/URETEROSCOPY/STONE EXTRACTION WITH BASKET Right 05/04/2020   Procedure: CYSTOSCOPY/RETROGRADE/URETEROSCOPY/ HOLMIUM LASER LITHOTRIPSY/STONE EXTRACTION WITH BASKET/ STENT EXCHANGE;  Surgeon: Rene Paci, MD;  Location: [facility];  Service: Urology;  Laterality: Right;   CYSTOSCOPY/URETEROSCOPY/HOLMIUM LASER/STENT PLACEMENT Right 03/09/2020   Procedure: CYSTOSCOPY/RETROGRADE/URETEROSCOPY/HOLMIUM LASER/STENT REPLACEMENT;  Surgeon: Rene Paci, MD;  Location[facility];  Service: Urology;  Laterality: Right;    CYSTOSCOPY/URETEROSCOPY/HOLMIUM LASER/STENT PLACEMENT Right 03/23/2020   Procedure: CYSTOSCOPY/STENT REMOVAL, URETEROSCOPY/HOLMIUM LASER/STENT PLACEMENT;  Surgeon: Rene Paci, MD;  Location: [facility];  Service: Urology;  Laterality: Right;   HOLMIUM LASER APPLICATION Right 05/04/2020   Procedure: HOLMIUM LASER APPLICATION;  Surgeon: Rene Paci, MD;  Location: [facility];  Service: Urology;  Laterality: Right;   Patient Active Problem List   Diagnosis Date Noted   Chronic pain of left knee 06/12/2022   Prediabetes 03/14/2021   History of stroke 09/05/2020   History of kidney stones 09/05/2020   Essential hypertension 01/03/2020   Prolonged QT interval 01/03/2020   Dyslipidemia 11/03/2019   Hx of adenomatous colonic polyps 08/05/2017   Left spastic hemiparesis (HCC) 05/15/2017   Essential hypertension 06/21/2015    ONSET DATE: 06/28/2022  REFERRING DIAG: Diagnosis M24.542 (ICD-10-CM) - Contracture of hand joint, left I69.354 (ICD-10-CM) - Hemiplegia and hemiparesis following cerebral infarction affecting left non-dominant side (HCC)  THERAPY DIAG:  Hemiplegia and hemiparesis following cerebral infarction affecting left non-dominant side (HCC)  Contracture of hand joint, left  Rationale for Evaluation and Treatment: Rehabilitation  SUBJECTIVE:   SUBJECTIVE STATEMENT: Via daughter - sometimes her shoulder hurts Pt accompanied by: interpreter: Grover Canavan and daughter  PERTINENT HISTORY:  Hemorrhagic stroke 2008, uncontrolled HTN  PRECAUTIONS: Fall and Other: no driving  WEIGHT BEARING RESTRICTIONS: No  PAIN:  Are you having pain?  Lt shoulder - burning pain, constant but fluctuates in intensity. Cold aggravates pain. Heat helps pain  FALLS: Has patient fallen in last 6 months? No  LIVING ENVIRONMENT: Lives with:  lives with their daughter Lives in: House/apartment - 1 level Stairs: Yes: External: 3 steps; on  right going up, on left going up, and can reach both Has following equipment at home: Single point cane and Grab bars   PLOF: Requires assistive device for independence and Needs assistance with homemaking   PATIENT GOALS: "To make my left body feel better"   OBJECTIVE:   HAND DOMINANCE: Right  ADLs: Transfers/ambulation related to ADLs: mod I w/ cane Eating: mod I  Grooming: mod I  UB Dressing: assist w/ bra, mod I for shirt LB Dressing: assist w/ underwear and shoes Toileting: mod I  Bathing: mod I for showering w/ walk in shower w/ grab bars Tub Shower transfers: mod I  Equipment: Grab bars  IADLs: Shopping: Daughter mostly does, pt will sometimes come with her Light housekeeping: Daughter performs Meal Prep: Daughter performs and pt can retrieve when daughter is at work [employer] mobility: relies on daughter for transportation Medication management: daughter gives her medication Handwriting:  N/A  MOBILITY STATUS:  walks w/ SPC   UPPER EXTREMITY ROM:  RUE AROM WNL's.  Very little AROM LUE w/ only shoulder shrugs.  PROM LUE WFL's w/ extreme difficulty for elbow extension d/t spasticity  HAND FUNCTION: Unable Lt hand  COORDINATION: No functional use Lt hand  SENSATION: Impaired - can detect but cannot localize  EDEMA: mild Lt hand  MUSCLE TONE: LUE: Mild, Moderate, and Hypertonic - worse w/ elbow elbow extension (? Could benefit from botox to biceps)   COGNITION: Overall cognitive status: Difficulty to assess due to: language barrier  VISION: Subjective report: Daughter reports sun bothers her eyes and makes Lt eye more blurry Baseline vision:  sunglasses outside  VISION ASSESSMENT: Ocular ROM: WFL Tracking/Visual pursuits: Able to track stimulus in all quads without difficulty   OBSERVATIONS: Interpreter present but daughter did most of the translating. Pt pleasant w/ no functional movement LUE   TODAY'S TREATMENT:                                                                                                                                N/A Today   PATIENT EDUCATION: Education details: OT POC Person educated: Patient and Child(ren) Education method: Explanation Education comprehension: verbalized understanding  HOME EXERCISE PROGRAM: N/A Today   GOALS: Goals reviewed with patient? Yes   LONG TERM GOALS: Target date: 08/24/22  Independent with HEP for LUE Baseline:  Goal status: INITIAL  2.  Pt/family to verbalize understanding of potential A/E needs and one handed techniques for ADLS Baseline:  Goal status: INITIAL  3.  Independent with splint wear and care prn (? Resting hand splint, bean bag splint for Lt elbow)  Baseline:  Goal status: INITIAL  4.  Pt/family to verbalize understanding with pain reduction strategies for Lt shoulder including: stretches, positioning, modalities Baseline:  Goal status: INITIAL     ASSESSMENT:  CLINICAL IMPRESSION: Patient is a 61 y.o.  female who was seen today for occupational therapy evaluation for "Lt hand contracture" and hemiplegia Lt non-dominant side from stroke 2008. Pt however only has mild spasticity in hand w/ no contracture, but more spasticity noted in elbow. Pt would benefit from short duration of O.T. to address above goals to maximize function and stretching LUE. Pt would also benefit from spasticity management from MD - recommended this to pt/daughter.  PERFORMANCE DEFICITS: in functional skills including ADLs, IADLs, tone, ROM, pain, mobility, decreased knowledge of use of DME, and UE functional use,   IMPAIRMENTS: are limiting patient from ADLs, IADLs, and rest and sleep.   CO-MORBIDITIES: may have co-morbidities  that affects occupational performance. Patient will benefit from skilled OT to address above impairments and improve overall function.  MODIFICATION OR ASSISTANCE TO COMPLETE EVALUATION: Min-Moderate modification of tasks or assist with assess  necessary to complete an evaluation.  OT OCCUPATIONAL PROFILE AND HISTORY: Problem focused assessment: Including review of records relating to presenting problem.  CLINICAL DECISION MAKING: Moderate - several treatment options, min-mod task modification necessary  REHAB POTENTIAL: Good  EVALUATION COMPLEXITY: Low    PLAN:  OT FREQUENCY: 1x/week  OT DURATION: 4 weeks (plus eval)   PLANNED INTERVENTIONS: self care/ADL training, therapeutic exercise, therapeutic activity, neuromuscular re-education, manual therapy, passive range of motion, functional mobility training, splinting, moist heat, patient/family education, coping strategies training, and DME and/or AE instructions  RECOMMENDED OTHER SERVICES: none at this time  CONSULTED AND AGREED WITH PLAN OF CARE: Patient and family member/caregiver  PLAN FOR NEXT SESSION: HEP for self stretches and caregiver stretch for elbow extension   Sheran Lawless, OT 07/25/2022, 9:34 AM

## 2022-07-25 ENCOUNTER — Ambulatory Visit: Payer: Medicaid Other | Admitting: Occupational Therapy

## 2022-07-25 ENCOUNTER — Ambulatory Visit: Payer: Medicaid Other | Admitting: Physical Therapy

## 2022-07-25 DIAGNOSIS — M24542 Contracture, left hand: Secondary | ICD-10-CM

## 2022-07-25 DIAGNOSIS — I69354 Hemiplegia and hemiparesis following cerebral infarction affecting left non-dominant side: Secondary | ICD-10-CM

## 2022-07-25 DIAGNOSIS — R2681 Unsteadiness on feet: Secondary | ICD-10-CM

## 2022-07-25 DIAGNOSIS — R2689 Other abnormalities of gait and mobility: Secondary | ICD-10-CM

## 2022-07-25 DIAGNOSIS — M6281 Muscle weakness (generalized): Secondary | ICD-10-CM

## 2022-07-25 NOTE — Therapy (Signed)
OUTPATIENT PHYSICAL THERAPY NEURO TREATMENT   Patient Name: Laquila Huck MRN: [id number] DOB:1960-02-23, 62 y.o., female Today's Date: 07/25/2022   PCP: Georgina Quint, MD REFERRING PROVIDER: Rodolph Bong, MD  END OF SESSION:  PT End of Session - 07/25/22 0801     Visit Number 5    Number of Visits 7   Plus eval   Date for PT Re-Evaluation 08/15/22    Authorization Type [place] [employer]    PT Start Time 0805   pt in restroom   PT Stop Time 0845    PT Time Calculation (min) 40 min    Equipment Utilized During Treatment Other (comment)   Bioness   Activity Tolerance Patient tolerated treatment well    Behavior During Therapy [facility] for tasks assessed/performed                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[facility])    Hemorrhagic stroke (HCC) 2009   History of kidney stones    Hx of adenomatous colonic polyps 08/05/2017   Hypertension    Illiterate    pt cannot read/write english, daughter chien h signs consents   Left spastic hemiparesis (HCC)    uses cane   Microcytic anemia    Tubular adenoma of colon    Past Surgical History:  Procedure Laterality Date   CYSTOSCOPY W/ URETERAL STENT PLACEMENT Right 01/03/2020   Procedure: CYSTOSCOPY WITH RETROGRADE PYELOGRAM/URETERAL STENT PLACEMENT;  Surgeon: Rene Paci, MD;  Location: [facility];  Service: Urology;  Laterality: Right;   CYSTOSCOPY/RETROGRADE/URETEROSCOPY/STONE EXTRACTION WITH BASKET Right 05/04/2020   Procedure: CYSTOSCOPY/RETROGRADE/URETEROSCOPY/ HOLMIUM LASER LITHOTRIPSY/STONE EXTRACTION WITH BASKET/ STENT EXCHANGE;  Surgeon: Rene Paci, MD;  Location: [facility];  Service: Urology;  Laterality: Right;   CYSTOSCOPY/URETEROSCOPY/HOLMIUM LASER/STENT PLACEMENT Right 03/09/2020   Procedure: CYSTOSCOPY/RETROGRADE/URETEROSCOPY/HOLMIUM LASER/STENT REPLACEMENT;  Surgeon: Rene Paci, MD;  Location: [facility];  Service: Urology;  Laterality: Right;   CYSTOSCOPY/URETEROSCOPY/HOLMIUM LASER/STENT PLACEMENT Right 03/23/2020   Procedure: CYSTOSCOPY/STENT REMOVAL, URETEROSCOPY/HOLMIUM LASER/STENT PLACEMENT;  Surgeon: Rene Paci, MD;  Location: [facility];  Service: Urology;  Laterality: Right;   HOLMIUM LASER APPLICATION Right 05/04/2020   Procedure: HOLMIUM LASER APPLICATION;  Surgeon: Rene Paci, MD;  Location: [facility];  Service: Urology;  Laterality: Right;   Patient Active Problem List   Diagnosis Date Noted   Chronic pain of left knee 06/12/2022   Prediabetes 03/14/2021   History of stroke 09/05/2020   History of kidney stones 09/05/2020   Essential hypertension 01/03/2020   Prolonged QT interval 01/03/2020   Dyslipidemia 11/03/2019   Hx of adenomatous colonic polyps 08/05/2017   Left spastic hemiparesis (HCC) 05/15/2017   Essential hypertension 06/21/2015    ONSET DATE: 06/19/2022 (referral)   REFERRING DIAG: R26.9 (ICD-10-CM) - Abnormality of gait M24.542 (ICD-10-CM) - Contracture of hand joint, left G81.14 (ICD-10-CM) - Left spastic hemiparesis (HCC)  THERAPY DIAG:  Hemiplegia and hemiparesis following cerebral infarction affecting left non-dominant side (HCC)  Other abnormalities of gait and mobility  Muscle weakness (generalized)  Unsteadiness on feet  Rationale for Evaluation and Treatment: Rehabilitation  SUBJECTIVE:  SUBJECTIVE STATEMENT: Pt reports no changes since last session, no falls. Pain is about the same.  Pt accompanied by: interpreter: Daughter and Grover Canavan (interpreter)   PERTINENT HISTORY: Hemorrhagic stroke, uncontrolled HTN  PAIN:  Are you having pain? Yes: NPRS scale: 6 (per  daughter)/10 Pain location: L knee Pain description: achy  PRECAUTIONS: Fall  WEIGHT BEARING RESTRICTIONS: No  FALLS: Has patient fallen in last 6 months? No  LIVING ENVIRONMENT: Lives with: lives with their daughter Lives in: House/apartment Stairs: Yes: External: 3 steps; on right going up, on left going up, and can reach both Has following equipment at home: Single point cane and Grab bars  PLOF: Requires assistive device for independence and Needs assistance with homemaking  PATIENT GOALS: "To make my left body feel better"   OBJECTIVE:   DIAGNOSTIC FINDINGS: CT of head from 2009:  IMPRESSION:   1. Slight interval increase in large acute hemorrhage in the right basal ganglia.  2. Slight interval increase in right to left midline shift measuring 3 mm compared to less than 1 on prior.  3. No evidence of hydrocephalus and unchanged compression of the right lateral ventricle.   COGNITION: Overall cognitive status: Difficulty to assess due to: Communication impairment and language barrier   SENSATION: Pt reports numbness/tingling in her entire LLE  and L hand    MUSCLE TONE: LLE: Hypertonic and flaccid L hand    POSTURE: rounded shoulders and forward head   LOWER EXTREMITY MMT:  Tested in seated position (difficult to assess due to language barrier)   MMT Right Eval Left Eval  Hip flexion 4- 1  Hip extension    Hip abduction 4 2-  Hip adduction 4 2-  Hip internal rotation    Hip external rotation    Knee flexion 3+ 3  Knee extension 4+ 3-  Ankle dorsiflexion 4- 0  Ankle plantarflexion    Ankle inversion    Ankle eversion    (Blank rows = not tested)  BED MOBILITY:  Independent per pt   TODAY'S TREATMENT:       E-stim attended/NMR Bioness Tablet 1 for L anterior tibialis and L hamstrings activation:  Gait pattern: decreased hip/knee flexion- Left, circumduction- Left, and genu recurvatum- Left Distance walked: 230 ft x 2 Assistive device utilized:   HHA from daughter Level of assistance:  HHA from daughter Comments: with use of Bioness for estim of L ant tib and L HS during gait; pt does exhibit decreased circumduction and improved ankle DF with use of Bioness however she continues to exhibit difficulty activating her HS and performing knee flexion during gait. Pt also exhibits hip flexor weakness and difficulty advancing limb without circumducting. Pt also fearful of falling and declines to ambulate with her SPC this date, prefers to utilize her daughter for assist during gait with use of Bioness.  Seated HS curls with AAROM 2 x 10 reps with use of Bioness Tablet 1 for L HS activation Pt exhibits minimal ability to perform HS curls in seated position without active-assist   TherAct  [facility] PT Assessment - 07/25/22 1302       Ambulation/Gait   Gait velocity 32.8 ft over 28.34 sec = 1.12 ft/sec              PATIENT EDUCATION: Education details: plan for next session, purpose of Bioness Person educated: Patient and Child(ren) Education method: Medical illustrator Education comprehension: verbalized understanding  HOME EXERCISE PROGRAM: Unable to provide in pt's language and daughter  unable to help pt perform at home   GOALS: Goals reviewed with patient? Yes  SHORT TERM GOALS: Target date: 07/25/2022  Pt will be independent with initial HEP for improved strength, balance, transfers and gait.  Baseline: not established on eval; not established due to language barrier and no one to assist at home  Goal status: NOT MET  2.  Pt will improve gait velocity to at least 1.0 ft/s w/LRAD and SBA for improved gait efficiency   Baseline: 0.87 ft/s w/SPC and CGA-min A, 1.12 ft/sec with HHA from daughter (6/19)  Goal status: MET  3.  Pt will trial various braces in clinic on LLE to determine safest option that promotes independence w/gait  Baseline: trialed on 5/30- will not pursue  Goal status: MET   LONG TERM GOALS:  Target date: 08/08/2022   Pt will be independent with final HEP for improved strength, balance, transfers and gait.  Baseline:  Goal status: INITIAL  2.  Pt will improve gait velocity to at least 1.5 ft/s w/LRAD and SBA for improved gait efficiency and independence   Baseline: 0.87 ft/s w/SPC and CGA-min A, 1.12 ft/sec with HHA from daughter (6/19)  Goal status: INITIAL  3.  Pt will improve 5 x STS to less than or equal to 20 seconds w/weight shift to L side to demonstrate improved functional strength and transfer efficiency.   Baseline: 25.5s w/RUE support, no shift to L side  Goal status: INITIAL   ASSESSMENT:  CLINICAL IMPRESSION: Emphasis of skilled PT session on assessing STG and continuing to work on LLE NMR with use of Bioness. Pt has met 2/3 STG due to improving her gait speed from 0.87 ft/sec to 1.12 ft/sec and due to trialing AFOs though it was determined that she would not benefit from use of an AFO at this time. Pt did not meet HEP goal as it has been difficult to assign her an HEP due to language barrier and her daughter is not available to assist her with HEP at home. Pt continues to exhibit minimal HS activation during gait and due to history of compensating for LLE weakness by circumducting for several years. Pt continues to benefit from skilled therapy services to work towards improving her L hemibody strength and improve her balance in order to decrease her fall risk. Continue POC.   OBJECTIVE IMPAIRMENTS: Abnormal gait, decreased balance, decreased coordination, decreased knowledge of use of DME, decreased mobility, difficulty walking, decreased ROM, decreased strength, impaired sensation, impaired tone, impaired UE functional use, and pain.   ACTIVITY LIMITATIONS: carrying, lifting, squatting, stairs, reach over head, hygiene/grooming, locomotion level, and caring for others  PARTICIPATION LIMITATIONS: cleaning, laundry, interpersonal relationship, driving, shopping,  community activity, and yard work  PERSONAL FACTORS: Age, Education, Fitness, Past/current experiences, and 1 comorbidity: HTN  are also affecting patient's functional outcome.   REHAB POTENTIAL: Fair due to chronicity of CVA  CLINICAL DECISION MAKING: Evolving/moderate complexity  EVALUATION COMPLEXITY: Moderate  PLAN:  PT FREQUENCY: 1x/week  PT DURATION: 6 weeks  PLANNED INTERVENTIONS: Therapeutic exercises, Therapeutic activity, Neuromuscular re-education, Balance training, Gait training, Patient/Family education, Self Care, Joint mobilization, Stair training, Orthotic/Fit training, DME instructions, Aquatic Therapy, Dry Needling, Electrical stimulation, Manual therapy, and Re-evaluation  PLAN FOR NEXT SESSION: Bioness. HEP for sit <>stands and to promote weight shift to L side. Toe taps, LLE NMR, anything to facilitate L knee flexion. TM. Weight acceptance on LLE, try powder board + estim to L hamstrings   Peter Congo, PT, DPT,  CSRS 07/25/2022, 1:04 PM

## 2022-07-25 NOTE — Progress Notes (Deleted)
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  History supplemented by referring provider's note.  CT/MRI/MRA head personally reviewed.   Patient has residual left hemiparesis secondary to right hemorrhagic stroke in March 2009.  She developed sudden onset headache and left leg weakness.  She presented to [facility] where imaging revealed right basal ganglia hemorrhage.  Blood pressure was elevated requiring management with a Cardene drip.    04/20/2007 CT HEAD:  4 cm acute right basal ganglia hemorrhage.  04/20/2007 CT HEAD:  1. Slight interval increase in large acute hemorrhage in the right basal ganglia.  2. Slight interval increase in right to left midline shift measuring 3 mm compared to less than 1 on prior.  3. No evidence of hydrocephalus and unchanged compression of the right lateral ventricle.  04/21/2007 MRI BRAIN:  1.  Large right basal ganglia hemorrhage and associated surrounding edema and mass effect as noted above. This includes midline shift to the left by 5.7 mm and slightly low-lying cerebellar tonsils.  Follow-up will be necessary to evaluate for possibility of underlying lesion. 2.  No thrombotic infarct.  3.  No intracranial mass lesion separate from the intracranial hemorrhage is noted on this unenhanced slightly motion-degraded exam.  04/21/2007 MRA HEAD:  Motion-degraded examination. A discrete vascular malformation or aneurysm as cause for the basal ganglia hemorrhage is not identified, although evaluation is significantly limited and will require  follow-up as noted above.  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[facility])    Hemorrhagic stroke (HCC) 2009   History of kidney stones    Hx of adenomatous colonic polyps 08/05/2017   Hypertension    Illiterate    pt cannot read/write english, daughter chien h signs consents   Left spastic hemiparesis (HCC)    uses cane   Microcytic anemia    Tubular adenoma of colon     PAST SURGICAL HISTORY: Past Surgical History:  Procedure Laterality Date   CYSTOSCOPY W/ URETERAL STENT PLACEMENT Right 01/03/2020   Procedure: CYSTOSCOPY WITH RETROGRADE PYELOGRAM/URETERAL STENT PLACEMENT;  Surgeon: Rene Paci, MD;  Location: [facility];  Service: Urology;  Laterality: Right;   CYSTOSCOPY/RETROGRADE/URETEROSCOPY/STONE EXTRACTION WITH BASKET Right 05/04/2020   Procedure: CYSTOSCOPY/RETROGRADE/URETEROSCOPY/ HOLMIUM LASER LITHOTRIPSY/STONE EXTRACTION WITH BASKET/ STENT EXCHANGE;  Surgeon: Rene Paci, MD;  Location: [facility];  Service: Urology;  Laterality: Right;   CYSTOSCOPY/URETEROSCOPY/HOLMIUM LASER/STENT PLACEMENT Right 03/09/2020   Procedure: CYSTOSCOPY/RETROGRADE/URETEROSCOPY/HOLMIUM LASER/STENT REPLACEMENT;  Surgeon: Rene Paci, MD;  Location: [facility];  Service: Urology;  Laterality: Right;   CYSTOSCOPY/URETEROSCOPY/HOLMIUM LASER/STENT PLACEMENT Right 03/23/2020   Procedure: CYSTOSCOPY/STENT REMOVAL, URETEROSCOPY/HOLMIUM LASER/STENT PLACEMENT;  Surgeon: Rene Paci, MD;  Location: [facility];  Service: Urology;  Laterality: Right;   HOLMIUM LASER APPLICATION Right 05/04/2020   Procedure: HOLMIUM LASER APPLICATION;  Surgeon: Rene Paci, MD;  Location: [facility];  Service: Urology;  Laterality: Right;    MEDICATIONS: Current Outpatient Medications on  File Prior  to Visit  Medication Sig Dispense Refill   amLODipine (NORVASC) 10 MG tablet Take 1 tablet (10 mg total) by mouth daily. 90 tablet 3   atorvastatin (LIPITOR) 40 MG tablet TAKE 1 TABLET(40 MG) BY MOUTH DAILY 90 tablet 3   losartan (COZAAR) 50 MG tablet TAKE 1 TABLET(50 MG) BY MOUTH DAILY 90 tablet 0   meloxicam (MOBIC) 7.5 MG tablet TAKE 1 TABLET(7.5 MG) BY MOUTH DAILY FOR 10 DAYS 10 tablet 0   No current facility-administered medications on file prior to visit.    ALLERGIES: No Known Allergies  FAMILY HISTORY: Family History  Problem Relation Age of Onset   Hypertension Neg Hx    Hyperlipidemia Neg Hx    Colon cancer Neg Hx    Colon polyps Neg Hx    Esophageal cancer Neg Hx    Rectal cancer Neg Hx    Stomach cancer Neg Hx    Breast cancer Neg Hx    BRCA 1/2 Neg Hx    Diabetes Neg Hx     Objective:  *** General: No acute distress.  Patient appears well-groomed.   Head:  Normocephalic/atraumatic Eyes:  fundi examined but not visualized Neck: supple, no paraspinal tenderness, full range of motion Back: No paraspinal tenderness Heart: regular rate and rhythm Lungs: Clear to auscultation bilaterally. Vascular: No carotid bruits. Neurological Exam: Mental status: alert and oriented to person, place, and time, speech fluent and not dysarthric, language intact. Cranial nerves: CN I: not tested CN II: pupils equal, round and reactive to light, visual fields intact CN III, IV, VI:  full range of motion, no nystagmus, no ptosis CN V: facial sensation intact. CN VII: upper and lower face symmetric CN VIII: hearing intact CN IX, X: gag intact, uvula midline CN XI: sternocleidomastoid and trapezius muscles intact CN XII: tongue midline Bulk & Tone: normal, no fasciculations. Motor:  muscle strength 5/5 throughout Sensation:  Pinprick, temperature and vibratory sensation intact. Deep Tendon Reflexes:  2+ throughout,  toes downgoing.   Finger to nose testing:  Without  dysmetria.   Heel to shin:  Without dysmetria.   Gait:  Normal station and stride.  Romberg negative.    Thank you for allowing me to take part in the care of this patient.  Shon Millet, DO  CC: ***

## 2022-07-26 ENCOUNTER — Ambulatory Visit: Payer: Medicaid Other | Admitting: Neurology

## 2022-07-27 NOTE — Progress Notes (Unsigned)
Rubin Payor, PhD, LAT, ATC acting as a scribe for Clementeen Graham, MD.  Barbara Strickland is a 62 y.o. female who presents to [facility] today for f/u L knee & L hand pain and abnormal gait. Pt was last seen by Dr. Denyse Amass on 06/19/22 and was given a L knee steroid injection, advised to use Voltaren gel, and was referred to neuro rehab, completing 5 visits. She was also referred to hand therapy, completing 1 visit.  Today, pt reports slight improvement w/ her L knee pain. She now c/o pain in the posterior aspect of her L ankle and in to L shoulder. She has an upcoming hand therapy visit on the 27th.   Dx imaging: 06/12/22 L knee XR   Pertinent review of systems: No fever or chills  Relevant historical information: History of old stroke causing left spastic hemiparesis.  Hypertension.   Exam:  BP 128/86   Pulse 65   Ht 4\' 11"  (1.499 m)   Wt 131 lb 6.4 oz (59.6 kg)   SpO2 98%   BMI 26.54 kg/m  General: Well Developed, well nourished, and in no acute distress.   MSK: Left shoulder normal-appearing Decreased range of motion. Nontender.  Left ankle normal-appearing Mildly tender palpation at lateral ankle along the course of the peroneal tendon. Decreased range of motion and decreased strength.    Lab and Radiology Results  Diagnostic Limited MSK Ultrasound of: Left ankle Lateral ankle joint shows small joint effusion.  Normal-appearing peroneal tendons Impression: Mild ankle effusion  X-ray images left ankle and left shoulder obtained today personally and independently interpreted  Left shoulder: Mild glenohumeral DJD.  No acute fractures.  Left ankle: Mild DJD.  No acute fractures    Await formal radiology review   Assessment and Plan: 62 y.o. female with left ankle pain thought to be lateral ankle impingement.  Plan for compression sleeve and Voltaren gel.  Check back in a month if not better consider injection.  Her fundamental weakness is  definitely a factor here may be difficult to control 4.  Left shoulder pain mild DJD present.  Will ask OT to help with range of motion.  Again hemiparesis will prevent full recovery.  Consider steroid injection on recheck if needed.  Left knee pain mildly improved after steroid injection.   PDMP not reviewed this encounter. Orders Placed This Encounter  Procedures   [place] LIMITED JOINT SPACE STRUCTURES UP LEFT(NO LINKED CHARGES)    Order Specific Question:   Reason for Exam (SYMPTOM  OR DIAGNOSIS REQUIRED)    Answer:   left shoulder pain    Order Specific Question:   Preferred imaging location?    Answer:   [facility] Ankle Complete Left    Standing Status:   Future    Number of Occurrences:   1    Standing Expiration Date:   07/30/2023    Order Specific Question:   Reason for Exam (SYMPTOM  OR DIAGNOSIS REQUIRED)    Answer:   left ankle pain    Order Specific Question:   Preferred imaging location?    Answer:   [facility]   DG Shoulder Left    Standing Status:   Future    Number of Occurrences:   1    Standing Expiration Date:   08/29/2022    Order Specific Question:   Reason for Exam (SYMPTOM  OR DIAGNOSIS REQUIRED)    Answer:  left shoulder pain    Order Specific Question:   Preferred imaging location?    Answer:   [facility]   Ambulatory referral to Physical Therapy    Referral Priority:   Routine    Referral Type:   Physical Medicine    Referral Reason:   Specialty Services Required    Requested Specialty:   Physical Therapy    Number of Visits Requested:   1   No orders of the defined types were placed in this encounter.    Discussed warning signs or symptoms. Please see discharge instructions. Patient expresses understanding.   The above documentation has been reviewed and is accurate and complete Clementeen Graham, M.D.

## 2022-07-30 ENCOUNTER — Other Ambulatory Visit: Payer: Self-pay

## 2022-07-30 ENCOUNTER — Ambulatory Visit (INDEPENDENT_AMBULATORY_CARE_PROVIDER_SITE_OTHER): Payer: Medicaid Other

## 2022-07-30 ENCOUNTER — Ambulatory Visit (INDEPENDENT_AMBULATORY_CARE_PROVIDER_SITE_OTHER): Payer: Medicaid Other | Admitting: Family Medicine

## 2022-07-30 VITALS — BP 128/86 | HR 65 | Ht 59.0 in | Wt 131.4 lb

## 2022-07-30 DIAGNOSIS — G8929 Other chronic pain: Secondary | ICD-10-CM | POA: Diagnosis not present

## 2022-07-30 DIAGNOSIS — M25562 Pain in left knee: Secondary | ICD-10-CM

## 2022-07-30 DIAGNOSIS — M25512 Pain in left shoulder: Secondary | ICD-10-CM | POA: Diagnosis not present

## 2022-07-30 DIAGNOSIS — M25572 Pain in left ankle and joints of left foot: Secondary | ICD-10-CM

## 2022-07-30 NOTE — Patient Instructions (Addendum)
Thank you for coming in today.   Please get an Xray today before you leave   Continue with physical therapy.  Try using an ankle compression sleeve  Please use Voltaren gel (Generic Diclofenac Gel) up to 4x daily for pain as needed.  This is available over-the-counter as both the name brand Voltaren gel and the generic diclofenac gel.   Check back in 1 month

## 2022-07-31 ENCOUNTER — Ambulatory Visit: Payer: Medicaid Other | Admitting: Family Medicine

## 2022-08-02 ENCOUNTER — Ambulatory Visit: Payer: Medicaid Other | Admitting: Physical Therapy

## 2022-08-02 ENCOUNTER — Ambulatory Visit: Payer: Medicaid Other | Admitting: Occupational Therapy

## 2022-08-02 ENCOUNTER — Encounter: Payer: Self-pay | Admitting: Occupational Therapy

## 2022-08-02 DIAGNOSIS — I69354 Hemiplegia and hemiparesis following cerebral infarction affecting left non-dominant side: Secondary | ICD-10-CM

## 2022-08-02 DIAGNOSIS — R2689 Other abnormalities of gait and mobility: Secondary | ICD-10-CM

## 2022-08-02 DIAGNOSIS — M6281 Muscle weakness (generalized): Secondary | ICD-10-CM

## 2022-08-02 DIAGNOSIS — R2681 Unsteadiness on feet: Secondary | ICD-10-CM

## 2022-08-02 NOTE — Therapy (Signed)
OUTPATIENT OCCUPATIONAL THERAPY NEURO TREATMENT  Patient Name: Barbara Strickland MRN: [id number] DOB:1960/12/27, 62 y.o., female Today's Date: 08/02/2022  PCP: Georgina Quint, MD REFERRING PROVIDER: Rodolph Bong, MD  END OF SESSION:  OT End of Session - 08/02/22 0806     Visit Number 2    Number of Visits 5    Date for OT Re-Evaluation 08/24/22    Authorization Type [employer] MCD - No auth required    OT Start Time 0804    OT Stop Time 0845    OT Time Calculation (min) 41 min    Activity Tolerance Patient tolerated treatment well    Behavior During Therapy 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[facility])    Hemorrhagic stroke (HCC) 2009   History of kidney stones    Hx of adenomatous colonic polyps 08/05/2017   Hypertension    Illiterate    pt cannot read/write english, daughter chien h signs consents   Left spastic hemiparesis (HCC)    uses cane   Microcytic anemia    Tubular adenoma of colon    Past Surgical History:  Procedure Laterality Date   CYSTOSCOPY W/ URETERAL STENT PLACEMENT Right 01/03/2020   Procedure: CYSTOSCOPY WITH RETROGRADE PYELOGRAM/URETERAL STENT PLACEMENT;  Surgeon: Rene Paci, MD;  Location[facility];  Service: Urology;  Laterality: Right;   CYSTOSCOPY/RETROGRADE/URETEROSCOPY/STONE EXTRACTION WITH BASKET Right 05/04/2020   Procedure: CYSTOSCOPY/RETROGRADE/URETEROSCOPY/ HOLMIUM LASER LITHOTRIPSY/STONE EXTRACTION WITH BASKET/ STENT EXCHANGE;  Surgeon: Rene Paci, MD;  Location[facility];  Service: Urology;  Laterality: Right;   CYSTOSCOPY/URETEROSCOPY/HOLMIUM LASER/STENT PLACEMENT Right 03/09/2020   Procedure: CYSTOSCOPY/RETROGRADE/URETEROSCOPY/HOLMIUM LASER/STENT REPLACEMENT;  Surgeon: Rene Paci, MD;  Location: [facility];  Service: Urology;  Laterality: Right;    CYSTOSCOPY/URETEROSCOPY/HOLMIUM LASER/STENT PLACEMENT Right 03/23/2020   Procedure: CYSTOSCOPY/STENT REMOVAL, URETEROSCOPY/HOLMIUM LASER/STENT PLACEMENT;  Surgeon: Rene Paci, MD;  Location: [facility];  Service: Urology;  Laterality: Right;   HOLMIUM LASER APPLICATION Right 05/04/2020   Procedure: HOLMIUM LASER APPLICATION;  Surgeon: Rene Paci, MD;  Location: [facility];  Service: Urology;  Laterality: Right;   Patient Active Problem List   Diagnosis Date Noted   Chronic pain of left knee 06/12/2022   Prediabetes 03/14/2021   History of stroke 09/05/2020   History of kidney stones 09/05/2020   Prolonged QT interval 01/03/2020   Dyslipidemia 11/03/2019   Hx of adenomatous colonic polyps 08/05/2017   Left spastic hemiparesis (HCC) 05/15/2017   Essential hypertension 06/21/2015    ONSET DATE: 06/28/2022  REFERRING DIAG: Diagnosis M24.542 (ICD-10-CM) - Contracture of hand joint, left I69.354 (ICD-10-CM) - Hemiplegia and hemiparesis following cerebral infarction affecting left non-dominant side (HCC)  THERAPY DIAG:  Hemiplegia and hemiparesis following cerebral infarction affecting left non-dominant side (HCC)  Rationale for Evaluation and Treatment: Rehabilitation  SUBJECTIVE:   SUBJECTIVE STATEMENT: Via daughter - No pain and no recent falls Pt accompanied by: interpreter: Grover Canavan and daughter  PERTINENT HISTORY:  Hemorrhagic stroke 2008, uncontrolled HTN  PRECAUTIONS: Fall and Other: no driving  WEIGHT BEARING RESTRICTIONS: No  PAIN:  Are you having pain?  Lt shoulder - burning pain, constant but fluctuates in intensity. Cold aggravates pain. Heat helps pain  FALLS: Has patient fallen in last 6 months? No  LIVING ENVIRONMENT: Lives with: lives with their daughter Lives in: House/apartment - 1  level Stairs: Yes: External: 3 steps; on right going up, on left going up, and can reach both Has following equipment  at home: Single point cane and Grab bars   PLOF: Requires assistive device for independence and Needs assistance with homemaking   PATIENT GOALS: "To make my left body feel better"   OBJECTIVE:   HAND DOMINANCE: Right  ADLs: Transfers/ambulation related to ADLs: mod I w/ cane Eating: mod I  Grooming: mod I  UB Dressing: assist w/ bra, mod I for shirt LB Dressing: assist w/ underwear and shoes Toileting: mod I  Bathing: mod I for showering w/ walk in shower w/ grab bars Tub Shower transfers: mod I  Equipment: Grab bars  IADLs: Shopping: Daughter mostly does, pt will sometimes come with her Light housekeeping: Daughter performs Meal Prep: Daughter performs and pt can retrieve when daughter is at work [employer] mobility: relies on daughter for transportation Medication management: daughter gives her medication Handwriting:  N/A  MOBILITY STATUS:  walks w/ SPC   UPPER EXTREMITY ROM:  RUE AROM WNL's.  Very little AROM LUE w/ only shoulder shrugs.  PROM LUE WFL's w/ extreme difficulty for elbow extension d/t spasticity  HAND FUNCTION: Unable Lt hand  COORDINATION: No functional use Lt hand  SENSATION: Impaired - can detect but cannot localize  EDEMA: mild Lt hand  MUSCLE TONE: LUE: Mild, Moderate, and Hypertonic - worse w/ elbow elbow extension (? Could benefit from botox to biceps)   COGNITION: Overall cognitive status: Difficulty to assess due to: language barrier  VISION: Subjective report: Daughter reports sun bothers her eyes and makes Lt eye more blurry Baseline vision:  sunglasses outside  VISION ASSESSMENT: Ocular ROM: WFL Tracking/Visual pursuits: Able to track stimulus in all quads without difficulty   OBSERVATIONS: Interpreter present but daughter did most of the translating. Pt pleasant w/ no functional movement LUE   TODAY'S TREATMENT:                                                                                                                                Pt issued HEP for self stretches - see pt instructions for details.   Pt/daughter shown elbow brace options for night time prn (handout provided).  Pt/daughter denies tightness in hand therefore decided resting hand splint not needed.   PATIENT EDUCATION: Education details: HEP Person educated: Patient and Child(ren) Education method: Programmer, multimedia, Facilities manager, Verbal cues, and Handouts Education comprehension: verbalized understanding, returned demonstration, and needs further education  HOME EXERCISE PROGRAM: 08/02/22: self stretch HEP, brace option for elbow (for night time)   GOALS: Goals reviewed with patient? Yes   LONG TERM GOALS: Target date: 08/24/22  Independent with HEP for LUE Baseline:  Goal status: IN PROGRESS  2.  Pt/family to verbalize understanding of potential A/E needs and one handed techniques for ADLS Baseline:  Goal status: INITIAL  3.  Independent with splint wear and care prn (? Resting hand splint, bean  bag splint for Lt elbow)  Baseline:  Goal status: MET  4.  Pt/family to verbalize understanding with pain reduction strategies for Lt shoulder including: stretches, positioning, modalities Baseline:  Goal status: INITIAL     ASSESSMENT:  CLINICAL IMPRESSION: Patient returns today for initiation of HEP - pt tolerating well.   PERFORMANCE DEFICITS: in functional skills including ADLs, IADLs, tone, ROM, pain, mobility, decreased knowledge of use of DME, and UE functional use,   IMPAIRMENTS: are limiting patient from ADLs, IADLs, and rest and sleep.   CO-MORBIDITIES: may have co-morbidities  that affects occupational performance. Patient will benefit from skilled OT to address above impairments and improve overall function.  MODIFICATION OR ASSISTANCE TO COMPLETE EVALUATION: Min-Moderate modification of tasks or assist with assess necessary to complete an evaluation.  OT OCCUPATIONAL PROFILE AND HISTORY: Problem focused assessment:  Including review of records relating to presenting problem.  CLINICAL DECISION MAKING: Moderate - several treatment options, min-mod task modification necessary  REHAB POTENTIAL: Good  EVALUATION COMPLEXITY: Low    PLAN:  OT FREQUENCY: 1x/week  OT DURATION: 4 weeks (plus eval)   PLANNED INTERVENTIONS: self care/ADL training, therapeutic exercise, therapeutic activity, neuromuscular re-education, manual therapy, passive range of motion, functional mobility training, splinting, moist heat, patient/family education, coping strategies training, and DME and/or AE instructions  RECOMMENDED OTHER SERVICES: none at this time  CONSULTED AND AGREED WITH PLAN OF CARE: Patient and family member/caregiver  PLAN FOR NEXT SESSION: review HEP, review bed positioning (sounds like pt does well with this but just review), any A/E needs?    Sheran Lawless, OT 08/02/2022, 8:07 AM

## 2022-08-02 NOTE — Patient Instructions (Signed)
Flexion (Assistive)    Clasp hands together and raise arms above head, keeping elbows as straight as possible. Can be done sitting or lying. Repeat _10___ times. Do __2__ sessions per day.  Flexion (Passive)    Sitting upright, slide BOTH forearms forward along table (folded towel underneath forearms), bending from the waist until a stretch is felt. Hold __3__ seconds. Repeat _10___ times. Do _2___ sessions per day.    PROM: Elbow Flexion / Extension    Grasp left arm at wrist and gently bend elbow as far as possible (fold towel underneath elbow). Then straighten arm as far as possible - Hold __5__ seconds. Repeat _10___ times per set.  Do __2__ sessions per day.  Supination (Passive)    Keep elbow bent at right angle and held firmly at side. Use other hand to turn forearm until palm faces upward. Hold _10___ seconds. Repeat __5__ times. Do __2__ sessions per day.   SELF ASSISTED: Finger Extension    Use one hand to open fingers on other hand. Hold 5-10 sec.  Repeat _5__ reps per set, __2_ sets per day

## 2022-08-02 NOTE — Progress Notes (Signed)
Left ankle x-ray shows a little bit of arthritis.

## 2022-08-02 NOTE — Progress Notes (Signed)
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

## 2022-08-02 NOTE — Therapy (Signed)
OUTPATIENT PHYSICAL THERAPY NEURO TREATMENT   Patient Name: Barbara Strickland MRN: [id number] DOB:10-May-1960, 62 y.o., female Today's Date: 08/02/2022   PCP: Georgina Quint, MD REFERRING PROVIDER: Rodolph Bong, MD  END OF SESSION:  PT End of Session - 08/02/22 0933     Visit Number 6    Number of Visits 7   Plus eval   Date for PT Re-Evaluation 08/15/22    Authorization Type [place] [employer]    PT Start Time 0930    PT Stop Time 1010    PT Time Calculation (min) 40 min    Equipment Utilized During Treatment Other (comment)   [place] estim, powder board   Activity Tolerance Patient tolerated treatment well    Behavior During Therapy 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[facility])    Hemorrhagic stroke (HCC) 2009   History of kidney stones    Hx of adenomatous colonic polyps 08/05/2017   Hypertension    Illiterate    pt cannot read/write english, daughter chien h signs consents   Left spastic hemiparesis (HCC)    uses cane   Microcytic anemia    Tubular adenoma of colon    Past Surgical History:  Procedure Laterality Date   CYSTOSCOPY W/ URETERAL STENT PLACEMENT Right 01/03/2020   Procedure: CYSTOSCOPY WITH RETROGRADE PYELOGRAM/URETERAL STENT PLACEMENT;  Surgeon: Rene Paci, MD;  Location: [facility];  Service: Urology;  Laterality: Right;   CYSTOSCOPY/RETROGRADE/URETEROSCOPY/STONE EXTRACTION WITH BASKET Right 05/04/2020   Procedure: CYSTOSCOPY/RETROGRADE/URETEROSCOPY/ HOLMIUM LASER LITHOTRIPSY/STONE EXTRACTION WITH BASKET/ STENT EXCHANGE;  Surgeon: Rene Paci, MD;  Location: [facility];  Service: Urology;  Laterality: Right;   CYSTOSCOPY/URETEROSCOPY/HOLMIUM LASER/STENT PLACEMENT Right 03/09/2020   Procedure: CYSTOSCOPY/RETROGRADE/URETEROSCOPY/HOLMIUM LASER/STENT REPLACEMENT;  Surgeon: Rene Paci, MD;  Location:  [facility];  Service: Urology;  Laterality: Right;   CYSTOSCOPY/URETEROSCOPY/HOLMIUM LASER/STENT PLACEMENT Right 03/23/2020   Procedure: CYSTOSCOPY/STENT REMOVAL, URETEROSCOPY/HOLMIUM LASER/STENT PLACEMENT;  Surgeon: Rene Paci, MD;  [facility];  Service: Urology;  Laterality: Right;   HOLMIUM LASER APPLICATION Right 05/04/2020   Procedure: HOLMIUM LASER APPLICATION;  Surgeon: Rene Paci, MD;  Location: [facility];  Service: Urology;  Laterality: Right;   Patient Active Problem List   Diagnosis Date Noted   Chronic pain of left knee 06/12/2022   Prediabetes 03/14/2021   History of stroke 09/05/2020   History of kidney stones 09/05/2020   Prolonged QT interval 01/03/2020   Dyslipidemia 11/03/2019   Hx of adenomatous colonic polyps 08/05/2017   Left spastic hemiparesis (HCC) 05/15/2017   Essential hypertension 06/21/2015    ONSET DATE: 06/19/2022 (referral)   REFERRING DIAG: R26.9 (ICD-10-CM) - Abnormality of gait M24.542 (ICD-10-CM) - Contracture of hand joint, left G81.14 (ICD-10-CM) - Left spastic hemiparesis (HCC)  THERAPY DIAG:  Hemiplegia and hemiparesis following cerebral infarction affecting left non-dominant side (HCC)  Other abnormalities of gait and mobility  Muscle weakness (generalized)  Unsteadiness on feet  Rationale for Evaluation and Treatment: Rehabilitation  SUBJECTIVE:  SUBJECTIVE STATEMENT: Pt reports no changes since last visit, did see Dr. Denyse Amass about her L ankle and her L knee. Pt reports her L knee feeling better after getting an injection. Pt's daughter still needs to get her the L ankle brace and the Voltarin gel.  Pt accompanied by: interpreter: Daughter   PERTINENT HISTORY:  Hemorrhagic stroke, uncontrolled HTN  PAIN:  Are you having pain? Yes: NPRS scale: 6 (per daughter)/10 Pain location: L knee Pain description: achy  PRECAUTIONS: Fall  WEIGHT BEARING RESTRICTIONS: No  FALLS: Has patient fallen in last 6 months? No  LIVING ENVIRONMENT: Lives with: lives with their daughter Lives in: House/apartment Stairs: Yes: External: 3 steps; on right going up, on left going up, and can reach both Has following equipment at home: Single point cane and Grab bars  PLOF: Requires assistive device for independence and Needs assistance with homemaking  PATIENT GOALS: "To make my left body feel better"   OBJECTIVE:   DIAGNOSTIC FINDINGS: CT of head from 2009:  IMPRESSION:   1. Slight interval increase in large acute hemorrhage in the right basal ganglia.  2. Slight interval increase in right to left midline shift measuring 3 mm compared to less than 1 on prior.  3. No evidence of hydrocephalus and unchanged compression of the right lateral ventricle.   COGNITION: Overall cognitive status: Difficulty to assess due to: Communication impairment and language barrier   SENSATION: Pt reports numbness/tingling in her entire LLE  and L hand    MUSCLE TONE: LLE: Hypertonic and flaccid L hand    POSTURE: rounded shoulders and forward head   LOWER EXTREMITY MMT:  Tested in seated position (difficult to assess due to language barrier)   MMT Right Eval Left Eval  Hip flexion 4- 1  Hip extension    Hip abduction 4 2-  Hip adduction 4 2-  Hip internal rotation    Hip external rotation    Knee flexion 3+ 3  Knee extension 4+ 3-  Ankle dorsiflexion 4- 0  Ankle plantarflexion    Ankle inversion    Ankle eversion    (Blank rows = not tested)  BED MOBILITY:  Independent per pt   TODAY'S TREATMENT:        E-stim Attended/NMR NMES level 24 to L HS x 10 min with use of powderboard in sidelying Pt exhibits good activation of her L hamstrings and is able to  perform AAROM knee flexion x 10 reps However, pt with ongoing poor carryover and continues to exhibit circumduction during gait following use of estim with no HS activation noted  TherEx Pt demonstrates L ankle stretches that she is currently doing and she is performing PROM ankle circles quite aggressively. Educated pt and her daughter on importance of performing DF and eversion stretches but to avoid PF and inversion stretches due to increased ROM in these motions and pain along joint line on dorsum of foot. Provided hands-on demonstration, pt and daughter able to provide return demonstration, and provided handout with pictures of ankle motions/stretches that pt should focus on performing at home going forwards.   PATIENT EDUCATION: Education details: plan to d/c session, added PROM ankle stretches to HEP, purpose of stim Person educated: Patient and Child(ren) Education method: Explanation, Demonstration, Tactile cues, Verbal cues, and Handouts Education comprehension: verbalized understanding, returned demonstration, verbal cues required, and tactile cues required  HOME EXERCISE PROGRAM: Provided picture of PROM ankle DF and eversion (unable to locate on MedBridge)   GOALS: Goals reviewed  with patient? Yes  SHORT TERM GOALS: Target date: 07/25/2022  Pt will be independent with initial HEP for improved strength, balance, transfers and gait.  Baseline: not established on eval; not established due to language barrier and no one to assist at home  Goal status: NOT MET  2.  Pt will improve gait velocity to at least 1.0 ft/s w/LRAD and SBA for improved gait efficiency   Baseline: 0.87 ft/s w/SPC and CGA-min A, 1.12 ft/sec with HHA from daughter (6/19)  Goal status: MET  3.  Pt will trial various braces in clinic on LLE to determine safest option that promotes independence w/gait  Baseline: trialed on 5/30- will not pursue  Goal status: MET   LONG TERM GOALS: Target date:  08/08/2022   Pt will be independent with final HEP for improved strength, balance, transfers and gait.  Baseline:  Goal status: INITIAL  2.  Pt will improve gait velocity to at least 1.5 ft/s w/LRAD and SBA for improved gait efficiency and independence   Baseline: 0.87 ft/s w/SPC and CGA-min A, 1.12 ft/sec with HHA from daughter (6/19)  Goal status: INITIAL  3.  Pt will improve 5 x STS to less than or equal to 20 seconds w/weight shift to L side to demonstrate improved functional strength and transfer efficiency.   Baseline: 25.5s w/RUE support, no shift to L side  Goal status: INITIAL   ASSESSMENT:  CLINICAL IMPRESSION: Emphasis of skilled PT session on reviewing safe ankle stretches to work on performing, avoiding overstretching of ankle into PF or eversion due to some ankle laxity and increased pain, and worked on NMES to L hamstrings to increase activation. Pt and her daughter demonstrate good understanding of exercises/stretches reviewed this session. Pt has good response to NMES while on powderboard with good HS activation noted, however she has poor carryover to gait following estim. Pt and her daughter requesting to d/c next session at end of POC. Continue POC.   OBJECTIVE IMPAIRMENTS: Abnormal gait, decreased balance, decreased coordination, decreased knowledge of use of DME, decreased mobility, difficulty walking, decreased ROM, decreased strength, impaired sensation, impaired tone, impaired UE functional use, and pain.   ACTIVITY LIMITATIONS: carrying, lifting, squatting, stairs, reach over head, hygiene/grooming, locomotion level, and caring for others  PARTICIPATION LIMITATIONS: cleaning, laundry, interpersonal relationship, driving, shopping, community activity, and yard work  PERSONAL FACTORS: Age, Education, Fitness, Past/current experiences, and 1 comorbidity: HTN  are also affecting patient's functional outcome.   REHAB POTENTIAL: Fair due to chronicity of  CVA  CLINICAL DECISION MAKING: Evolving/moderate complexity  EVALUATION COMPLEXITY: Moderate  PLAN:  PT FREQUENCY: 1x/week  PT DURATION: 6 weeks  PLANNED INTERVENTIONS: Therapeutic exercises, Therapeutic activity, Neuromuscular re-education, Balance training, Gait training, Patient/Family education, Self Care, Joint mobilization, Stair training, Orthotic/Fit training, DME instructions, Aquatic Therapy, Dry Needling, Electrical stimulation, Manual therapy, and Re-evaluation  PLAN FOR NEXT SESSION: assess LTG and d/c   Peter Congo, PT, DPT, CSRS 08/02/2022, 10:14 AM

## 2022-08-07 ENCOUNTER — Ambulatory Visit: Payer: Medicaid Other | Attending: Family Medicine | Admitting: Physical Therapy

## 2022-08-07 DIAGNOSIS — R2689 Other abnormalities of gait and mobility: Secondary | ICD-10-CM | POA: Diagnosis present

## 2022-08-07 DIAGNOSIS — R2681 Unsteadiness on feet: Secondary | ICD-10-CM | POA: Diagnosis present

## 2022-08-07 DIAGNOSIS — M6281 Muscle weakness (generalized): Secondary | ICD-10-CM | POA: Diagnosis present

## 2022-08-07 DIAGNOSIS — I69354 Hemiplegia and hemiparesis following cerebral infarction affecting left non-dominant side: Secondary | ICD-10-CM | POA: Diagnosis present

## 2022-08-07 NOTE — Therapy (Signed)
OUTPATIENT PHYSICAL THERAPY NEURO TREATMENT- DISCHARGE SUMMARY   Patient Name: Barbara Strickland MRN: [id number] DOB:11-06-1960, 62 y.o., female Today's Date: 08/07/2022   PCP: Georgina Quint, MD REFERRING PROVIDER: Rodolph Bong, MD  PHYSICAL THERAPY DISCHARGE SUMMARY  Visits from Start of Care: 7  Current functional level related to goals / functional outcomes: Pt is mod I w/SPC for all ADLs   Remaining deficits: L hemiplegia    Education / Equipment: Verbal HEP (pt has limited caregiver support and unable to print HEP in pt's language)   Patient agrees to discharge. Patient goals were partially met. Patient is being discharged due to maximized rehab potential.    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)    Hemorrhagic stroke (HCC) 2009   History of kidney stones    Hx of adenomatous colonic polyps 08/05/2017   Hypertension    Illiterate    pt cannot read/write english, daughter chien h signs consents   Left spastic hemiparesis (HCC)    uses cane   Microcytic anemia    Tubular adenoma of colon    Past Surgical History:  Procedure Laterality Date   CYSTOSCOPY W/ URETERAL STENT PLACEMENT Right 01/03/2020   Procedure: CYSTOSCOPY WITH RETROGRADE PYELOGRAM/URETERAL STENT PLACEMENT;  Surgeon: Rene Paci, MD;  Location: [facility];  Service: Urology;  Laterality: Right;   CYSTOSCOPY/RETROGRADE/URETEROSCOPY/STONE EXTRACTION WITH BASKET Right 05/04/2020   Procedure: CYSTOSCOPY/RETROGRADE/URETEROSCOPY/ HOLMIUM  LASER LITHOTRIPSY/STONE EXTRACTION WITH BASKET/ STENT EXCHANGE;  Surgeon: Rene Paci, MD;  Location: [facility];  Service: Urology;  Laterality: Right;   CYSTOSCOPY/URETEROSCOPY/HOLMIUM LASER/STENT PLACEMENT Right 03/09/2020   Procedure: CYSTOSCOPY/RETROGRADE/URETEROSCOPY/HOLMIUM LASER/STENT REPLACEMENT;  Surgeon: Rene Paci, MD;  Location: [facility];  Service: Urology;  Laterality: Right;   CYSTOSCOPY/URETEROSCOPY/HOLMIUM LASER/STENT PLACEMENT Right 03/23/2020   Procedure: CYSTOSCOPY/STENT REMOVAL, URETEROSCOPY/HOLMIUM LASER/STENT PLACEMENT;  Surgeon: Rene Paci, MD;  Location: [facility];  Service: Urology;  Laterality: Right;   HOLMIUM LASER APPLICATION Right 05/04/2020   Procedure: HOLMIUM LASER APPLICATION;  Surgeon: Rene Paci, MD;  Location: [facility];  Service: Urology;  Laterality: Right;   Patient Active Problem List   Diagnosis Date Noted   Chronic pain of left knee 06/12/2022   Prediabetes 03/14/2021   History of stroke 09/05/2020   History of kidney stones 09/05/2020   Prolonged QT interval 01/03/2020   Dyslipidemia 11/03/2019   Hx of adenomatous colonic polyps 08/05/2017   Left spastic hemiparesis (HCC) 05/15/2017   Essential hypertension 06/21/2015    ONSET DATE: 06/19/2022 (referral)   REFERRING DIAG: R26.9 (ICD-10-CM) - Abnormality of gait M24.542 (ICD-10-CM) - Contracture of hand joint, left G81.14 (ICD-10-CM) - Left spastic hemiparesis (HCC)  THERAPY DIAG:  Hemiplegia and hemiparesis following cerebral infarction affecting left non-dominant side (HCC)  Other abnormalities of gait and mobility  Muscle weakness (generalized)  Unsteadiness on feet  Rationale for Evaluation and Treatment: Rehabilitation  SUBJECTIVE:                                                                                                                                                                                              SUBJECTIVE STATEMENT: Pt presents with daughter and interpreter. Daughter states she has not picked up Voltaren gel or ankle brace yet. No falls   Pt accompanied by: interpreter: Daughter and Y Hin  PERTINENT HISTORY: Hemorrhagic stroke, uncontrolled HTN  PAIN:  Are you having pain? Yes: NPRS scale: 6 (per daughter)/10 Pain location: L knee Pain description: achy  PRECAUTIONS: Fall  WEIGHT BEARING RESTRICTIONS: No  FALLS: Has patient fallen in last 6 months? No  LIVING ENVIRONMENT: Lives with: lives with their daughter Lives in: House/apartment Stairs: Yes: External: 3 steps; on right going up, on left going up, and can reach both Has following equipment at home: Single point cane and Grab bars  PLOF: Requires assistive device for independence and Needs assistance with homemaking  PATIENT GOALS: "To make my left body feel better"   OBJECTIVE:   DIAGNOSTIC FINDINGS: CT of head from 2009:  IMPRESSION:   1. Slight interval increase in large acute hemorrhage in the right basal ganglia.  2. Slight interval increase in right to left midline shift measuring 3 mm compared to less than 1 on prior.  3. No evidence of hydrocephalus and unchanged compression of the right lateral ventricle.   COGNITION: Overall cognitive status: Difficulty to assess due to: Communication impairment and language barrier   SENSATION: Pt reports numbness/tingling in her entire LLE  and L hand    MUSCLE TONE: LLE: Hypertonic and flaccid L hand    POSTURE: rounded shoulders and forward head   LOWER EXTREMITY MMT:  Tested in seated position (difficult to assess due to language barrier)   MMT Right Eval Left Eval  Hip flexion 4- 1  Hip extension    Hip abduction 4 2-  Hip adduction 4 2-  Hip internal rotation    Hip external rotation    Knee flexion 3+ 3  Knee extension 4+ 3-  Ankle dorsiflexion 4- 0  Ankle plantarflexion    Ankle  inversion    Ankle eversion    (Blank rows = not tested)  BED MOBILITY:  Independent per pt   TODAY'S TREATMENT:       Ther Act  LTG Assessment   [facility] PT Assessment - 08/07/22 0813       Transfers   Five time sit to stand comments  20.97s   No weight shift  to LLE     Ambulation/Gait   Gait velocity 32.8' over 29.87s =1.1 ft/s w/SPC            Discussed goal outcomes and encouragement to continue gait training at home. Educated pt and daughter on returning to PT if mobility needs change, pt verbalized understanding.    PATIENT EDUCATION: Education details: Goal outcomes, see above  Person educated: Patient and Child(ren) Education method: Explanation Education comprehension: verbalized understanding  HOME EXERCISE PROGRAM: Provided picture of PROM ankle DF and eversion (unable to locate on MedBridge)   GOALS: Goals reviewed with patient? Yes  SHORT TERM GOALS: Target date: 07/25/2022  Pt will be independent with initial HEP for improved strength, balance, transfers and gait.  Baseline: not established on eval; not established due to language barrier and no one to assist at home  Goal status: NOT MET  2.  Pt will improve gait velocity to at least 1.0 ft/s w/LRAD and SBA for improved gait efficiency   Baseline: 0.87 ft/s w/SPC and CGA-min A, 1.12 ft/sec with HHA from daughter (6/19)  Goal status: MET  3.  Pt will trial various braces in clinic on LLE to determine safest option that promotes independence w/gait  Baseline: trialed on 5/30- will not pursue  Goal status: MET   LONG TERM GOALS: Target date: 08/08/2022   Pt will be independent with final HEP for improved strength, balance, transfers and gait.  Baseline:  Goal status: MET  2.  Pt will improve gait velocity to at least 1.5 ft/s w/LRAD and SBA for improved gait efficiency and independence   Baseline: 0.87 ft/s w/SPC and CGA-min A, 1.12 ft/sec with HHA from daughter (6/19); 1.1 ft/s w/SPC  Goal  status: NOT MET  3.  Pt will improve 5 x STS to less than or equal to 20 seconds w/weight shift to L side to demonstrate improved functional strength and transfer efficiency.   Baseline: 25.5s w/RUE support, no shift to L side; 20.97s w/no weight on LLE Goal status: PARTIALLY MET    ASSESSMENT:  CLINICAL IMPRESSION: Emphasis of skilled PT session on LTG assessment and DC from PT. Pt has met 1 of 3 goals and partially met 1 of 3 goals. Pt is regularly walking at home both indoors and outdoors and performing heel slides. Unable to provide pt a printed HEP due to language barrier and lack of support at home. Pt did improve her time on 5x STS but will not place weight on LLE, so partially met her goal. Pt has improved her gait speed since eval, but has remained the same since STG assessment. Pt had good response to use of Bioness to facilitate L ankle DF and knee flexion, but is unable to carry over without Bioness. Pt continues to be limited by L knee pain and L hemiplegia, but pain is moderately controlled w/steroid injection and pt to obtain ankle brace and Voltaren gel soon. Pt verbalized agreement to DC this date and is at baseline function.   OBJECTIVE IMPAIRMENTS: Abnormal gait, decreased balance, decreased coordination, decreased knowledge of use of DME, decreased mobility, difficulty walking, decreased ROM, decreased strength, impaired sensation, impaired tone, impaired UE functional use, and pain.   ACTIVITY LIMITATIONS: carrying, lifting, squatting, stairs, reach over head, hygiene/grooming, locomotion level, and caring for others  PARTICIPATION LIMITATIONS: cleaning, laundry, interpersonal relationship, driving, shopping, community activity, and yard work  PERSONAL FACTORS: Age, Education, Fitness, Past/current experiences, and 1 comorbidity: HTN  are also affecting patient's functional outcome.  REHAB POTENTIAL: Fair due to chronicity of CVA  CLINICAL DECISION MAKING:  Evolving/moderate complexity  EVALUATION COMPLEXITY: Moderate  PLAN:  PT FREQUENCY: 1x/week  PT DURATION: 6 weeks  PLANNED INTERVENTIONS: Therapeutic exercises, Therapeutic activity, Neuromuscular re-education, Balance training, Gait training, Patient/Family education, Self Care, Joint mobilization, Stair training, Orthotic/Fit training, DME instructions, Aquatic Therapy, Dry Needling, Electrical stimulation, Manual therapy, and Re-evaluation    Barbara Strickland E Beckhem Isadore, PT, DPT 08/07/2022, 8:27 AM

## 2022-08-28 ENCOUNTER — Ambulatory Visit: Payer: Medicaid Other | Admitting: Family Medicine

## 2022-08-28 NOTE — Progress Notes (Unsigned)
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  It has been unchanged for 15 years and I do not suspect this to be an active problem.  However, further questions and evaluation should be managed by her PCP. 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  Did not take medications this morning.  Advised to take her medication ASAP.  If not improved in a couple of hours to contact PCP or Urgent Care.   Total time reviewing chart, imaging reports and face to face with patient and daughter:  49 minutes   Subjective:  Barbara Strickland is a 62 year old right-handed female with HTN, dyslipidemia, prediabetes and history of stroke who presents for left spastic hemiparesis.  History supplemented by daughter and referring provider's note.  She is accompanied by an interpreter.  CT head personally reviewed.  Imaging for MRI/MRA not available.    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  Since then, she has residual spastic left hemiparesis.  She ambulates with a cane.  When she fell from the stroke, she hit the right side temporal-parietal region.  Since then she has continued to have a "bump".  She notes a mild throbbing pain off and on in that region.  She does not treat it with any pain relievers.  Over the years, it has not changed in size.   04/20/2007 CT HEAD WO:  4 cm acute right basal ganglia hemorrhage.  04/21/2007 MRI BRAIN WO:  1.  Large right basal ganglia hemorrhage and associated surrounding edema and mass effect as noted above. This includes midline shift to the left by 5.7 mm and slightly low-lying cerebellar tonsils.  Follow-up will be necessary  to evaluate for possibility of underlying lesion.  2.  No thrombotic infarct.  3.  No intracranial mass lesion separate from the intracranial hemorrhage is noted on this unenhanced slightly motion-degraded exam.  04/21/2007 MRA HEAD:  otion-degraded examination. A discrete vascular malformation or aneurysm as cause for the basal ganglia hemorrhage is not identified, although evaluation is significantly limited and will require follow-up as noted above.   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[facility])    Hemorrhagic stroke (HCC) 2009   History of kidney stones    Hx of adenomatous colonic polyps 08/05/2017   Hypertension    Illiterate    pt cannot read/write english, daughter chien h signs consents   Left spastic hemiparesis (HCC)    uses cane   Microcytic anemia    Tubular adenoma of colon     PAST SURGICAL HISTORY: Past Surgical History:  Procedure Laterality Date   CYSTOSCOPY W/ URETERAL STENT PLACEMENT Right 01/03/2020   Procedure: CYSTOSCOPY WITH RETROGRADE PYELOGRAM/URETERAL STENT PLACEMENT;  Surgeon: Rene Paci, MD;  Location: [facility];  Service: Urology;  Laterality: Right;   CYSTOSCOPY/RETROGRADE/URETEROSCOPY/STONE EXTRACTION WITH BASKET Right 05/04/2020   Procedure: CYSTOSCOPY/RETROGRADE/URETEROSCOPY/ HOLMIUM LASER LITHOTRIPSY/STONE EXTRACTION WITH BASKET/ STENT EXCHANGE;  Surgeon: Rene Paci, MD;  Location: [facility];  Service: Urology;  Laterality: Right;   CYSTOSCOPY/URETEROSCOPY/HOLMIUM LASER/STENT PLACEMENT Right 03/09/2020   Procedure: CYSTOSCOPY/RETROGRADE/URETEROSCOPY/HOLMIUM LASER/STENT REPLACEMENT;  Surgeon: Rene Paci, MD;  Location: [facility];  Service: Urology;  Laterality: Right;   CYSTOSCOPY/URETEROSCOPY/HOLMIUM LASER/STENT PLACEMENT Right 03/23/2020   Procedure: CYSTOSCOPY/STENT REMOVAL, URETEROSCOPY/HOLMIUM  LASER/STENT PLACEMENT;  Surgeon: Rene Paci, MD;  Location: [facility];  Service: Urology;  Laterality: Right;   HOLMIUM LASER APPLICATION Right 05/04/2020   Procedure: HOLMIUM LASER APPLICATION;  Surgeon: Rene Paci, MD;  Location: [facility];  Service: Urology;  Laterality: Right;    MEDICATIONS: Current Outpatient Medications on File Prior to Visit  Medication Sig Dispense Refill   amLODipine (NORVASC) 10 MG tablet Take 1 tablet (10 mg total) by mouth daily. 90 tablet 3   atorvastatin (LIPITOR) 40 MG tablet TAKE 1 TABLET(40 MG) BY MOUTH DAILY 90 tablet 3   losartan (COZAAR) 50 MG tablet TAKE 1 TABLET(50 MG) BY MOUTH DAILY 90 tablet 0   meloxicam (MOBIC) 7.5 MG tablet TAKE 1 TABLET(7.5 MG) BY MOUTH DAILY FOR 10 DAYS 10 tablet 0   No current facility-administered medications on file prior to visit.    ALLERGIES: No Known Allergies  FAMILY HISTORY: Family History  Problem Relation Age of Onset   Hypertension Neg Hx    Hyperlipidemia Neg Hx    Colon cancer Neg Hx    Colon polyps Neg Hx    Esophageal cancer Neg Hx    Rectal cancer Neg Hx    Stomach cancer Neg Hx    Breast cancer Neg Hx    BRCA 1/2 Neg Hx    Diabetes Neg Hx     Objective:  Blood pressure (!) 175/118, pulse 91, resp. rate 20, height 4\' 11"  (1.499 m), weight 133 lb (60.3 kg), SpO2 99%. General: No acute distress.  Patient appears well-groomed.   Head:  Normocephalic/atraumatic Eyes:  fundi examined but not visualized Neck: supple, no paraspinal tenderness, full range of motion Back: No paraspinal tenderness Heart: regular rate and rhythm Neurological Exam: Mental status: alert and oriented to person, place, and time, speech fluent and not dysarthric, language intact. Cranial nerves: CN I: not tested CN II: pupils equal, round and reactive to light, visual fields intact CN III, IV, VI:  full range of motion, no nystagmus, no ptosis CN V: facial  sensation intact. CN VII: upper and lower face symmetric CN VIII: hearing intact CN IX, X: gag intact, uvula midline CN XI: sternocleidomastoid and trapezius muscles intact CN XII: tongue midline Bulk & Tone: left upper and lower extremity spasticity.  No rigidity on the right.   Motor:  left upper extremity plegic.  Left hip flexion 2/5, 5/5 left knee extension, 0/5 left knee flexion, foot droop.  5/5 on right upper and lower extremities.  Reduced finger-thumb tapping speed and amplitude on right. Sensation:  Pinprick, and vibratory sensation intact. Deep Tendon Reflexes:  2+ right upper and lower extremities, 3+ left upper and lower extremities;  toes downgoing.   Finger to nose testing:  Without dysmetria on right, unable to assess left.    Gait:  Left hemiparetic gait.  Romberg negative.      Thank you for allowing me to take part in the care of this patient.  Shon Millet, DO  CC: Georgina Quint, MD

## 2022-08-29 ENCOUNTER — Ambulatory Visit (INDEPENDENT_AMBULATORY_CARE_PROVIDER_SITE_OTHER): Payer: Medicaid Other | Admitting: Neurology

## 2022-08-29 ENCOUNTER — Ambulatory Visit: Payer: Medicaid Other | Admitting: Neurology

## 2022-08-29 ENCOUNTER — Encounter: Payer: Self-pay | Admitting: Neurology

## 2022-08-29 VITALS — BP 175/118 | HR 91 | Resp 20 | Ht 59.0 in | Wt 133.0 lb

## 2022-08-29 DIAGNOSIS — G8114 Spastic hemiplegia affecting left nondominant side: Secondary | ICD-10-CM

## 2022-08-29 DIAGNOSIS — I10 Essential (primary) hypertension: Secondary | ICD-10-CM | POA: Diagnosis not present

## 2022-08-29 DIAGNOSIS — I609 Nontraumatic subarachnoid hemorrhage, unspecified: Secondary | ICD-10-CM | POA: Diagnosis not present

## 2022-08-29 NOTE — Patient Instructions (Signed)
The bump on the head is chronic from the head trauma.  It is not neurologic.  If you have further questions about it, I would follow up with your primary care provider

## 2022-08-29 NOTE — Progress Notes (Signed)
BP retaken at 8:29 am 186/107, had patient sit for another 5 minutes to see if will go down.  Retaken at 8:50 am 180/96 manually.    Per patient she hasn't taken her Blood Pressure pills this morning.   Advised patient to take hr medication and retake her Blood pressure in a couple of hours.

## 2022-09-28 ENCOUNTER — Other Ambulatory Visit: Payer: Self-pay | Admitting: Emergency Medicine

## 2022-11-24 ENCOUNTER — Other Ambulatory Visit: Payer: Self-pay | Admitting: Emergency Medicine

## 2022-11-24 DIAGNOSIS — I10 Essential (primary) hypertension: Secondary | ICD-10-CM

## 2022-12-11 ENCOUNTER — Ambulatory Visit: Payer: Medicaid Other | Admitting: Emergency Medicine

## 2022-12-11 ENCOUNTER — Encounter: Payer: Self-pay | Admitting: Emergency Medicine

## 2022-12-11 VITALS — BP 130/74 | HR 63 | Temp 97.9°F | Ht 59.0 in | Wt 134.4 lb

## 2022-12-11 DIAGNOSIS — G8114 Spastic hemiplegia affecting left nondominant side: Secondary | ICD-10-CM

## 2022-12-11 DIAGNOSIS — M25562 Pain in left knee: Secondary | ICD-10-CM

## 2022-12-11 DIAGNOSIS — Z8673 Personal history of transient ischemic attack (TIA), and cerebral infarction without residual deficits: Secondary | ICD-10-CM

## 2022-12-11 DIAGNOSIS — E785 Hyperlipidemia, unspecified: Secondary | ICD-10-CM

## 2022-12-11 DIAGNOSIS — G8929 Other chronic pain: Secondary | ICD-10-CM

## 2022-12-11 DIAGNOSIS — Z23 Encounter for immunization: Secondary | ICD-10-CM

## 2022-12-11 DIAGNOSIS — I10 Essential (primary) hypertension: Secondary | ICD-10-CM | POA: Diagnosis not present

## 2022-12-11 DIAGNOSIS — R7303 Prediabetes: Secondary | ICD-10-CM

## 2022-12-11 LAB — LIPID PANEL
Cholesterol: 168 mg/dL (ref 0–200)
HDL: 91.7 mg/dL (ref >39.00)
LDL Cholesterol: 63 mg/dL (ref 0–99)
NonHDL: 76.47
Total CHOL/HDL Ratio: 2
Triglycerides: 66 mg/dL (ref 0.0–149.0)
VLDL: 13.2 mg/dL (ref 0.0–40.0)

## 2022-12-11 LAB — COMPREHENSIVE METABOLIC PANEL
ALT: 17 U/L (ref 0–35)
AST: 23 U/L (ref 0–37)
Albumin: 4.1 g/dL (ref 3.5–5.2)
Alkaline Phosphatase: 54 U/L (ref 39–117)
BUN: 15 mg/dL (ref 6–23)
CO2: 29 meq/L (ref 19–32)
Calcium: 9 mg/dL (ref 8.4–10.5)
Chloride: 105 meq/L (ref 96–112)
Creatinine, Ser: 1.11 mg/dL (ref 0.40–1.20)
GFR: 52.41 mL/min — ABNORMAL LOW (ref >60.00)
Glucose, Bld: 91 mg/dL (ref 70–99)
Potassium: 3.8 meq/L (ref 3.5–5.1)
Sodium: 141 meq/L (ref 135–145)
Total Bilirubin: 0.9 mg/dL (ref 0.2–1.2)
Total Protein: 7.4 g/dL (ref 6.0–8.3)

## 2022-12-11 LAB — CBC WITH DIFFERENTIAL/PLATELET
Basophils Absolute: 0 10*3/uL (ref 0.0–0.1)
Basophils Relative: 0.7 % (ref 0.0–3.0)
Eosinophils Absolute: 0.1 10*3/uL (ref 0.0–0.7)
Eosinophils Relative: 2.3 % (ref 0.0–5.0)
HCT: 38.7 % (ref 36.0–46.0)
Hemoglobin: 11.9 g/dL — ABNORMAL LOW (ref 12.0–15.0)
Lymphocytes Relative: 29.6 % (ref 12.0–46.0)
Lymphs Abs: 1.9 10*3/uL (ref 0.7–4.0)
MCHC: 30.7 g/dL (ref 30.0–36.0)
MCV: 67.4 fL — ABNORMAL LOW (ref 78.0–100.0)
Monocytes Absolute: 0.5 10*3/uL (ref 0.1–1.0)
Monocytes Relative: 8.5 % (ref 3.0–12.0)
Neutro Abs: 3.7 10*3/uL (ref 1.4–7.7)
Neutrophils Relative %: 58.9 % (ref 43.0–77.0)
Platelets: 153 10*3/uL (ref 150.0–400.0)
RBC: 5.74 Mil/uL — ABNORMAL HIGH (ref 3.87–5.11)
RDW: 16.6 % — ABNORMAL HIGH (ref 11.5–15.5)
WBC: 6.3 10*3/uL (ref 4.0–10.5)

## 2022-12-11 LAB — HEMOGLOBIN A1C: Hgb A1c MFr Bld: 6 % (ref 4.6–6.5)

## 2022-12-11 NOTE — Assessment & Plan Note (Addendum)
Secondary stroke prevention measures discussed Well-controlled hypertension On blood pressure and cholesterol medication Lipid profile done today Non-smoker Prediabetic

## 2022-12-11 NOTE — Assessment & Plan Note (Signed)
Normotensive.  Well-controlled hypertension Continue amlodipine 10 mg daily and losartan 50 mg daily Cardiovascular risks associated with hypertension discussed Dietary approaches to stop hypertension discussed We will do blood work today and follow-up in 6 months

## 2022-12-11 NOTE — Patient Instructions (Signed)
Hypertension, Adult High blood pressure (hypertension) is when the force of blood pumping through the arteries is too strong. The arteries are the blood vessels that carry blood from the heart throughout the body. Hypertension forces the heart to work harder to pump blood and may cause arteries to become narrow or stiff. Untreated or uncontrolled hypertension can lead to a heart attack, heart failure, a stroke, kidney disease, and other problems. A blood pressure reading consists of a higher number over a lower number. Ideally, your blood pressure should be below 120/80. The first ("top") number is called the systolic pressure. It is a measure of the pressure in your arteries as your heart beats. The second ("bottom") number is called the diastolic pressure. It is a measure of the pressure in your arteries as the heart relaxes. What are the causes? The exact cause of this condition is not known. There are some conditions that result in high blood pressure. What increases the risk? Certain factors may make you more likely to develop high blood pressure. Some of these risk factors are under your control, including: Smoking. Not getting enough exercise or physical activity. Being overweight. Having too much fat, sugar, calories, or salt (sodium) in your diet. Drinking too much alcohol. Other risk factors include: Having a personal history of heart disease, diabetes, high cholesterol, or kidney disease. Stress. Having a family history of high blood pressure and high cholesterol. Having obstructive sleep apnea. Age. The risk increases with age. What are the signs or symptoms? High blood pressure may not cause symptoms. Very high blood pressure (hypertensive crisis) may cause: Headache. Fast or irregular heartbeats (palpitations). Shortness of breath. Nosebleed. Nausea and vomiting. Vision changes. Severe chest pain, dizziness, and seizures. How is this diagnosed? This condition is diagnosed by  measuring your blood pressure while you are seated, with your arm resting on a flat surface, your legs uncrossed, and your feet flat on the floor. The cuff of the blood pressure monitor will be placed directly against the skin of your upper arm at the level of your heart. Blood pressure should be measured at least twice using the same arm. Certain conditions can cause a difference in blood pressure between your right and left arms. If you have a high blood pressure reading during one visit or you have normal blood pressure with other risk factors, you may be asked to: Return on a different day to have your blood pressure checked again. Monitor your blood pressure at home for 1 week or longer. If you are diagnosed with hypertension, you may have other blood or imaging tests to help your health care provider understand your overall risk for other conditions. How is this treated? This condition is treated by making healthy lifestyle changes, such as eating healthy foods, exercising more, and reducing your alcohol intake. You may be referred for counseling on a healthy diet and physical activity. Your health care provider may prescribe medicine if lifestyle changes are not enough to get your blood pressure under control and if: Your systolic blood pressure is above 130. Your diastolic blood pressure is above 80. Your personal target blood pressure may vary depending on your medical conditions, your age, and other factors. Follow these instructions at home: Eating and drinking  Eat a diet that is high in fiber and potassium, and low in sodium, added sugar, and fat. An example of this eating plan is called the DASH diet. DASH stands for Dietary Approaches to Stop Hypertension. To eat this way: Eat   plenty of fresh fruits and vegetables. Try to fill one half of your plate at each meal with fruits and vegetables. Eat whole grains, such as whole-wheat pasta, brown rice, or whole-grain bread. Fill about one  fourth of your plate with whole grains. Eat or drink low-fat dairy products, such as skim milk or low-fat yogurt. Avoid fatty cuts of meat, processed or cured meats, and poultry with skin. Fill about one fourth of your plate with lean proteins, such as fish, chicken without skin, beans, eggs, or tofu. Avoid pre-made and processed foods. These tend to be higher in sodium, added sugar, and fat. Reduce your daily sodium intake. Many people with hypertension should eat less than 1,500 mg of sodium a day. Do not drink alcohol if: Your health care provider tells you not to drink. You are pregnant, may be pregnant, or are planning to become pregnant. If you drink alcohol: Limit how much you have to: 0-1 drink a day for women. 0-2 drinks a day for men. Know how much alcohol is in your drink. In the [place] one drink equals one 12 oz bottle of beer (355 mL), one 5 oz glass of wine (148 mL), or one 1 oz glass of hard liquor (44 mL). Lifestyle  Work with your health care provider to maintain a healthy body weight or to lose weight. Ask what an ideal weight is for you. Get at least 30 minutes of exercise that causes your heart to beat faster (aerobic exercise) most days of the week. Activities may include walking, swimming, or biking. Include exercise to strengthen your muscles (resistance exercise), such as Pilates or lifting weights, as part of your weekly exercise routine. Try to do these types of exercises for 30 minutes at least 3 days a week. Do not use any products that contain nicotine or tobacco. These products include cigarettes, chewing tobacco, and vaping devices, such as e-cigarettes. If you need help quitting, ask your health care provider. Monitor your blood pressure at home as told by your health care provider. Keep all follow-up visits. This is important. Medicines Take over-the-counter and prescription medicines only as told by your health care provider. Follow directions carefully. Blood  pressure medicines must be taken as prescribed. Do not skip doses of blood pressure medicine. Doing this puts you at risk for problems and can make the medicine less effective. Ask your health care provider about side effects or reactions to medicines that you should watch for. Contact a health care provider if you: Think you are having a reaction to a medicine you are taking. Have headaches that keep coming back (recurring). Feel dizzy. Have swelling in your ankles. Have trouble with your vision. Get help right away if you: Develop a severe headache or confusion. Have unusual weakness or numbness. Feel faint. Have severe pain in your chest or abdomen. Vomit repeatedly. Have trouble breathing. These symptoms may be an emergency. Get help right away. Call 911. Do not wait to see if the symptoms will go away. Do not drive yourself to the hospital. Summary Hypertension is when the force of blood pumping through your arteries is too strong. If this condition is not controlled, it may put you at risk for serious complications. Your personal target blood pressure may vary depending on your medical conditions, your age, and other factors. For most people, a normal blood pressure is less than 120/80. Hypertension is treated with lifestyle changes, medicines, or a combination of both. Lifestyle changes include losing weight, eating a healthy,   low-sodium diet, exercising more, and limiting alcohol. This information is not intended to replace advice given to you by your health care provider. Make sure you discuss any questions you have with your health care provider. Document Revised: 11/29/2020 Document Reviewed: 11/29/2020 [employer] Patient Education  2024 [employer]  

## 2022-12-11 NOTE — Progress Notes (Signed)
Barbara Strickland 62 y.o.   Chief Complaint  Patient presents with   Medical Management of Chronic Issues    f/u appt, ankle and knee pain     HISTORY OF PRESENT ILLNESS: This is a 62 y.o. female here for follow-up of chronic medical conditions History of stroke in the past, history of hypertension and dyslipidemia Was able to follow-up with neurologist last July.  There were no concerns except for elevated blood pressure because she had not taken the medications that particular morning. 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  However, further questions and evaluation should be managed by her PCP. 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  Did not take medications this morning.  Advised to take her medication ASAP.  If not improved in a couple of hours to contact PCP or Urgent Care.   BP Readings from Last 3 Encounters:  08/29/22 (!) 175/118  07/30/22 128/86  06/19/22 104/72     HPI   Prior to Admission medications   Medication Sig Start Date End Date Taking? Authorizing Provider  amLODipine (NORVASC) 10 MG tablet Take 1 tablet (10 mg total) by mouth daily. 03/12/22  Yes Georgina Quint, MD  atorvastatin (LIPITOR) 40 MG tablet TAKE 1 TABLET(40 MG) BY MOUTH DAILY 09/28/22  Yes Hannah Strader, Eilleen Kempf, MD  losartan (COZAAR) 50 MG tablet TAKE 1 TABLET(50 MG) BY MOUTH DAILY 11/25/22  Yes Niyla Marone, Eilleen Kempf, MD  meloxicam (MOBIC) 7.5 MG tablet TAKE 1 TABLET(7.5 MG) BY MOUTH DAILY FOR 10 DAYS 07/18/22  Yes  Georgina Quint, MD    No Known Allergies  Patient Active Problem List   Diagnosis Date Noted   Chronic pain of left knee 06/12/2022   Prediabetes 03/14/2021   History of stroke 09/05/2020   History of kidney stones 09/05/2020   Prolonged QT interval 01/03/2020   Dyslipidemia 11/03/2019   Hx of adenomatous colonic polyps 08/05/2017   Left spastic hemiparesis (HCC) 05/15/2017   Essential hypertension 06/21/2015    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[facility])    Hemorrhagic stroke (HCC) 2009   History of kidney stones    Hx of adenomatous colonic polyps 08/05/2017   Hypertension    Illiterate    pt cannot read/write english, daughter chien h signs consents   Left spastic hemiparesis (HCC)    uses cane   Microcytic anemia    Tubular adenoma of colon     Past Surgical History:  Procedure Laterality Date   CYSTOSCOPY W/ URETERAL STENT PLACEMENT Right 01/03/2020   Procedure: CYSTOSCOPY WITH RETROGRADE PYELOGRAM/URETERAL STENT PLACEMENT;  Surgeon: Rene Paci, MD;  Location: [facility];  Service: Urology;  Laterality: Right;   CYSTOSCOPY/RETROGRADE/URETEROSCOPY/STONE EXTRACTION WITH BASKET Right 05/04/2020   Procedure: CYSTOSCOPY/RETROGRADE/URETEROSCOPY/ HOLMIUM LASER LITHOTRIPSY/STONE EXTRACTION WITH BASKET/ STENT EXCHANGE;  Surgeon: Rene Paci, MD;  Location: [facility];  Service: Urology;  Laterality: Right;   CYSTOSCOPY/URETEROSCOPY/HOLMIUM LASER/STENT PLACEMENT Right 03/09/2020   Procedure: CYSTOSCOPY/RETROGRADE/URETEROSCOPY/HOLMIUM LASER/STENT REPLACEMENT;  Surgeon:  Rene Paci, MD;  Location: [facility];  Service: Urology;  Laterality: Right;   CYSTOSCOPY/URETEROSCOPY/HOLMIUM LASER/STENT PLACEMENT Right 03/23/2020   Procedure: CYSTOSCOPY/STENT REMOVAL, URETEROSCOPY/HOLMIUM LASER/STENT PLACEMENT;  Surgeon: Rene Paci, MD;  Location:  [facility];  Service: Urology;  Laterality: Right;   HOLMIUM LASER APPLICATION Right 05/04/2020   Procedure: HOLMIUM LASER APPLICATION;  Surgeon: Rene Paci, MD;  Location: [facility];  Service: Urology;  Laterality: Right;    Social History   Socioeconomic History   Marital status: Married    Spouse name: Not on file   Number of children: 4   Years of education: Not on file   Highest education level: Not on file  Occupational History   Not on file  Tobacco Use   Smoking status: Never   Smokeless tobacco: Never  Vaping Use   Vaping status: Never Used  Substance and Sexual Activity   Alcohol use: Never   Drug use: Never   Sexual activity: Not on file  Other Topics Concern   Not on file  Social History Narrative   ** Merged History Encounter **    No caffeine   Lives with daughter   One floor   Not working   Right handed   Social Determinants of Health   Financial Resource Strain: Not on file  Food Insecurity: Not on file  Transportation Needs: Not on file  Physical Activity: Not on file  Stress: Not on file  Social Connections: Not on file  Intimate Partner Violence: Not on file    Family History  Problem Relation Age of Onset   Hypertension Neg Hx    Hyperlipidemia Neg Hx    Colon cancer Neg Hx    Colon polyps Neg Hx    Esophageal cancer Neg Hx    Rectal cancer Neg Hx    Stomach cancer Neg Hx    Breast cancer Neg Hx    BRCA 1/2 Neg Hx    Diabetes Neg Hx      Review of Systems  Constitutional: Negative.  Negative for chills and fever.  HENT: Negative.  Negative for congestion and sore throat.   Respiratory: Negative.  Negative for cough and shortness of breath.   Cardiovascular: Negative.  Negative for chest pain and palpitations.  Gastrointestinal:  Negative for abdominal pain, diarrhea, nausea and vomiting.  Musculoskeletal:  Positive for joint pain.  Skin: Negative.  Negative for rash.  Neurological:  Negative.  Negative for dizziness and headaches.  All other systems reviewed and are negative.   Vitals:   12/11/22 0807  Pulse: 63  Temp: 97.9 F (36.6 C)  SpO2: 95%    Physical Exam Vitals reviewed.  Constitutional:      Appearance: Normal appearance.  HENT:     Head: Normocephalic.     Mouth/Throat:     Mouth: Mucous membranes are moist.     Pharynx: Oropharynx is clear.  Eyes:     Extraocular Movements: Extraocular movements intact.     Conjunctiva/sclera: Conjunctivae normal.     Pupils: Pupils are equal, round, and reactive to light.  Cardiovascular:     Rate and Rhythm: Normal rate and regular rhythm.     Pulses: Normal pulses.     Heart sounds: Normal heart sounds.  Pulmonary:     Effort: Pulmonary effort is normal.     Breath sounds: Normal breath sounds.  Musculoskeletal:     Cervical back: No tenderness.  Lymphadenopathy:  Cervical: No cervical adenopathy.  Skin:    General: Skin is warm and dry.  Neurological:     Mental Status: She is alert and oriented to person, place, and time. Mental status is at baseline.     Comments: Left-sided spastic hemiparesis  Psychiatric:        Mood and Affect: Mood normal.        Behavior: Behavior normal.      ASSESSMENT & PLAN: A total of 46 minutes was spent with the patient and counseling/coordination of care regarding preparing for this visit, review of most recent office visit notes, review of most recent neurologist office visit notes, review of multiple chronic medical conditions under management, review of all medications, review of most recent blood work results, review of health maintenance items, cardiovascular risks associated with hypertension, education on nutrition, prognosis, documentation, and need for follow-up  Problem List Items Addressed This Visit       Cardiovascular and Mediastinum   Essential hypertension - Primary    Normotensive.  Well-controlled hypertension Continue amlodipine 10 mg  daily and losartan 50 mg daily Cardiovascular risks associated with hypertension discussed Dietary approaches to stop hypertension discussed We will do blood work today and follow-up in 6 months      Relevant Orders   CBC with Differential/Platelet   Comprehensive metabolic panel   Hemoglobin A1c   Lipid panel     Nervous and Auditory   Left spastic hemiparesis (HCC)    As a result of nontraumatic hemorrhagic stroke Stable.  No concerns.        Other   Dyslipidemia    Diet and nutrition discussed Lipid profile done today Continue atorvastatin 40 mg daily      Relevant Orders   Comprehensive metabolic panel   Hemoglobin A1c   Lipid panel   History of stroke    Secondary stroke prevention measures discussed Well-controlled hypertension On blood pressure and cholesterol medication Lipid profile done today Non-smoker Prediabetic      Prediabetes    Diet and nutrition discussed Hemoglobin A1c done today We will follow-up in 6 months      Relevant Orders   Hemoglobin A1c   Chronic pain of left knee    Chronic stable condition Pain management discussed Prefer she uses Tylenol.  NSAIDs, meloxicam, only as needed      Other Visit Diagnoses     Need for vaccination       Relevant Orders   Flu vaccine trivalent PF, 6mos and older(Flulaval,Afluria,Fluarix,Fluzone)      Patient Instructions  Hypertension, Adult High blood pressure (hypertension) is when the force of blood pumping through the arteries is too strong. The arteries are the blood vessels that carry blood from the heart throughout the body. Hypertension forces the heart to work harder to pump blood and may cause arteries to become narrow or stiff. Untreated or uncontrolled hypertension can lead to a heart attack, heart failure, a stroke, kidney disease, and other problems. A blood pressure reading consists of a higher number over a lower number. Ideally, your blood pressure should be below 120/80. The  first ("top") number is called the systolic pressure. It is a measure of the pressure in your arteries as your heart beats. The second ("bottom") number is called the diastolic pressure. It is a measure of the pressure in your arteries as the heart relaxes. What are the causes? The exact cause of this condition is not known. There are some conditions that result in high  blood pressure. What increases the risk? Certain factors may make you more likely to develop high blood pressure. Some of these risk factors are under your control, including: Smoking. Not getting enough exercise or physical activity. Being overweight. Having too much fat, sugar, calories, or salt (sodium) in your diet. Drinking too much alcohol. Other risk factors include: Having a personal history of heart disease, diabetes, high cholesterol, or kidney disease. Stress. Having a family history of high blood pressure and high cholesterol. Having obstructive sleep apnea. Age. The risk increases with age. What are the signs or symptoms? High blood pressure may not cause symptoms. Very high blood pressure (hypertensive crisis) may cause: Headache. Fast or irregular heartbeats (palpitations). Shortness of breath. Nosebleed. Nausea and vomiting. Vision changes. Severe chest pain, dizziness, and seizures. How is this diagnosed? This condition is diagnosed by measuring your blood pressure while you are seated, with your arm resting on a flat surface, your legs uncrossed, and your feet flat on the floor. The cuff of the blood pressure monitor will be placed directly against the skin of your upper arm at the level of your heart. Blood pressure should be measured at least twice using the same arm. Certain conditions can cause a difference in blood pressure between your right and left arms. If you have a high blood pressure reading during one visit or you have normal blood pressure with other risk factors, you may be asked to: Return  on a different day to have your blood pressure checked again. Monitor your blood pressure at home for 1 week or longer. If you are diagnosed with hypertension, you may have other blood or imaging tests to help your health care provider understand your overall risk for other conditions. How is this treated? This condition is treated by making healthy lifestyle changes, such as eating healthy foods, exercising more, and reducing your alcohol intake. You may be referred for counseling on a healthy diet and physical activity. Your health care provider may prescribe medicine if lifestyle changes are not enough to get your blood pressure under control and if: Your systolic blood pressure is above 130. Your diastolic blood pressure is above 80. Your personal target blood pressure may vary depending on your medical conditions, your age, and other factors. Follow these instructions at home: Eating and drinking  Eat a diet that is high in fiber and potassium, and low in sodium, added sugar, and fat. An example of this eating plan is called the DASH diet. DASH stands for Dietary Approaches to Stop Hypertension. To eat this way: Eat plenty of fresh fruits and vegetables. Try to fill one half of your plate at each meal with fruits and vegetables. Eat whole grains, such as whole-wheat pasta, brown rice, or whole-grain bread. Fill about one fourth of your plate with whole grains. Eat or drink low-fat dairy products, such as skim milk or low-fat yogurt. Avoid fatty cuts of meat, processed or cured meats, and poultry with skin. Fill about one fourth of your plate with lean proteins, such as fish, chicken without skin, beans, eggs, or tofu. Avoid pre-made and processed foods. These tend to be higher in sodium, added sugar, and fat. Reduce your daily sodium intake. Many people with hypertension should eat less than 1,500 mg of sodium a day. Do not drink alcohol if: Your health care provider tells you not to  drink. You are pregnant, may be pregnant, or are planning to become pregnant. If you drink alcohol: Limit how much you have  to: 0-1 drink a day for women. 0-2 drinks a day for men. Know how much alcohol is in your drink. In the [place] one drink equals one 12 oz bottle of beer (355 mL), one 5 oz glass of wine (148 mL), or one 1 oz glass of hard liquor (44 mL). Lifestyle  Work with your health care provider to maintain a healthy body weight or to lose weight. Ask what an ideal weight is for you. Get at least 30 minutes of exercise that causes your heart to beat faster (aerobic exercise) most days of the week. Activities may include walking, swimming, or biking. Include exercise to strengthen your muscles (resistance exercise), such as Pilates or lifting weights, as part of your weekly exercise routine. Try to do these types of exercises for 30 minutes at least 3 days a week. Do not use any products that contain nicotine or tobacco. These products include cigarettes, chewing tobacco, and vaping devices, such as e-cigarettes. If you need help quitting, ask your health care provider. Monitor your blood pressure at home as told by your health care provider. Keep all follow-up visits. This is important. Medicines Take over-the-counter and prescription medicines only as told by your health care provider. Follow directions carefully. Blood pressure medicines must be taken as prescribed. Do not skip doses of blood pressure medicine. Doing this puts you at risk for problems and can make the medicine less effective. Ask your health care provider about side effects or reactions to medicines that you should watch for. Contact a health care provider if you: Think you are having a reaction to a medicine you are taking. Have headaches that keep coming back (recurring). Feel dizzy. Have swelling in your ankles. Have trouble with your vision. Get help right away if you: Develop a severe headache or  confusion. Have unusual weakness or numbness. Feel faint. Have severe pain in your chest or abdomen. Vomit repeatedly. Have trouble breathing. These symptoms may be an emergency. Get help right away. Call 911. Do not wait to see if the symptoms will go away. Do not drive yourself to the hospital. Summary Hypertension is when the force of blood pumping through your arteries is too strong. If this condition is not controlled, it may put you at risk for serious complications. Your personal target blood pressure may vary depending on your medical conditions, your age, and other factors. For most people, a normal blood pressure is less than 120/80. Hypertension is treated with lifestyle changes, medicines, or a combination of both. Lifestyle changes include losing weight, eating a healthy, low-sodium diet, exercising more, and limiting alcohol. This information is not intended to replace advice given to you by your health care provider. Make sure you discuss any questions you have with your health care provider. Document Revised: 11/29/2020 Document Reviewed: 11/29/2020 Elsevier Patient Education  2024 Elsevier Inc.     Edwina Barth, MD [facility]

## 2022-12-11 NOTE — Assessment & Plan Note (Signed)
As a result of nontraumatic hemorrhagic stroke Stable.  No concerns.

## 2022-12-11 NOTE — Assessment & Plan Note (Signed)
Diet and nutrition discussed. Lipid profile done today. Continue atorvastatin 40 mg daily. 

## 2022-12-11 NOTE — Assessment & Plan Note (Signed)
Diet and nutrition discussed Hemoglobin A1c done today We will follow-up in 6 months

## 2022-12-11 NOTE — Assessment & Plan Note (Signed)
Chronic stable condition Pain management discussed Prefer she uses Tylenol.  NSAIDs, meloxicam, only as needed

## 2023-02-20 ENCOUNTER — Other Ambulatory Visit: Payer: Self-pay | Admitting: Emergency Medicine

## 2023-02-20 DIAGNOSIS — I10 Essential (primary) hypertension: Secondary | ICD-10-CM

## 2023-02-23 ENCOUNTER — Other Ambulatory Visit: Payer: Self-pay | Admitting: Emergency Medicine

## 2023-02-23 DIAGNOSIS — I10 Essential (primary) hypertension: Secondary | ICD-10-CM

## 2023-04-20 IMAGING — MG MM DIGITAL DIAGNOSTIC UNILAT*L* W/ TOMO W/ CAD
8 series · 9 of 24 positions shown · non-contrast
Comparison: Previous exam(s).

CLINICAL DATA: Patient recalled from screening for possible left
breast distortion.

EXAM:
DIGITAL DIAGNOSTIC UNILATERAL LEFT MAMMOGRAM WITH TOMOSYNTHESIS AND
CAD
TECHNIQUE: Left digital diagnostic mammography and breast tomosynthesis was
performed. The images were evaluated with computer-aided detection.

[L CC synth-2D (1 of 2)]
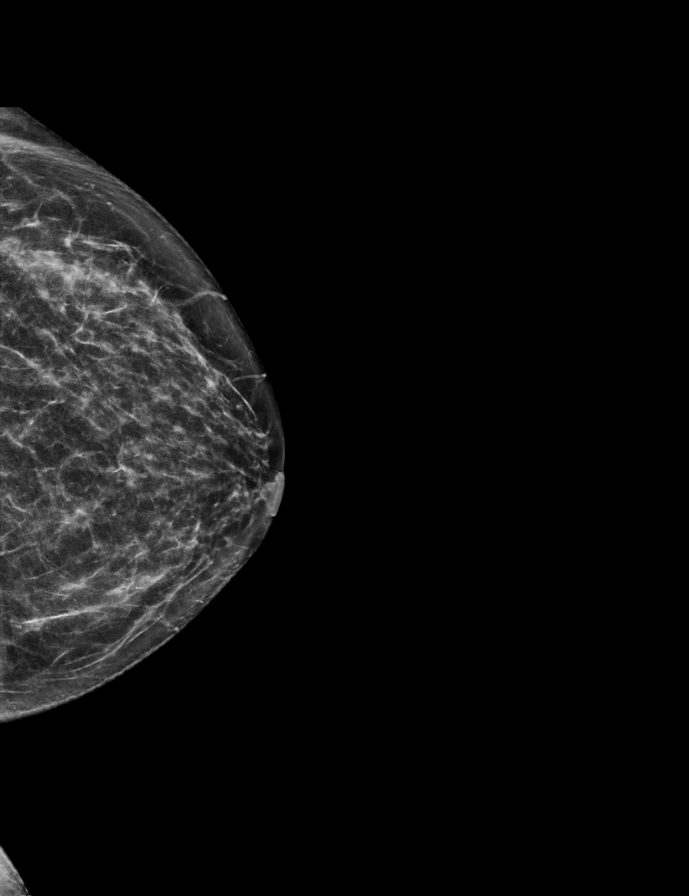

[L ML synth-2D]
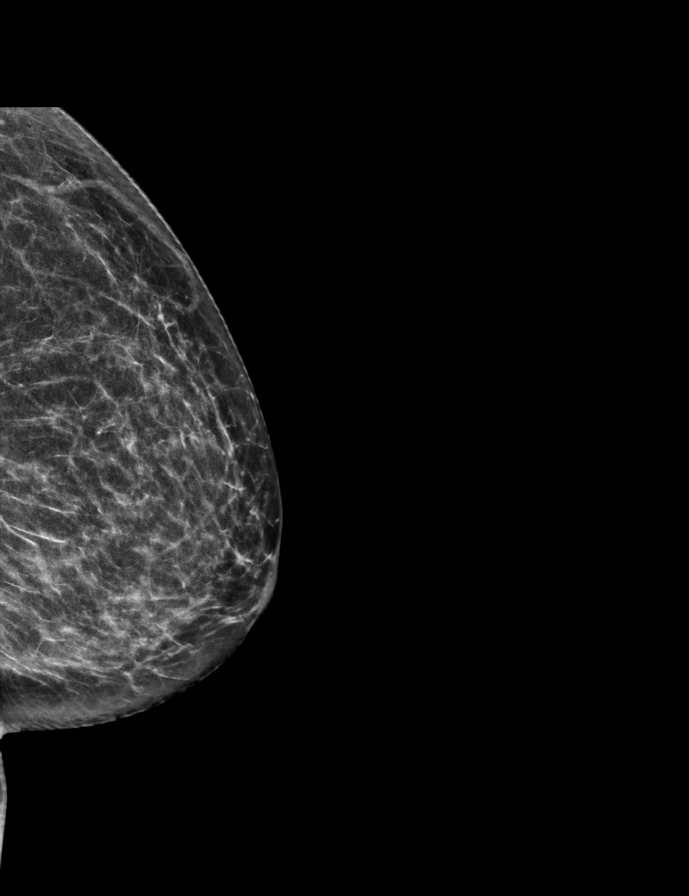

[L MLO synth-2D]
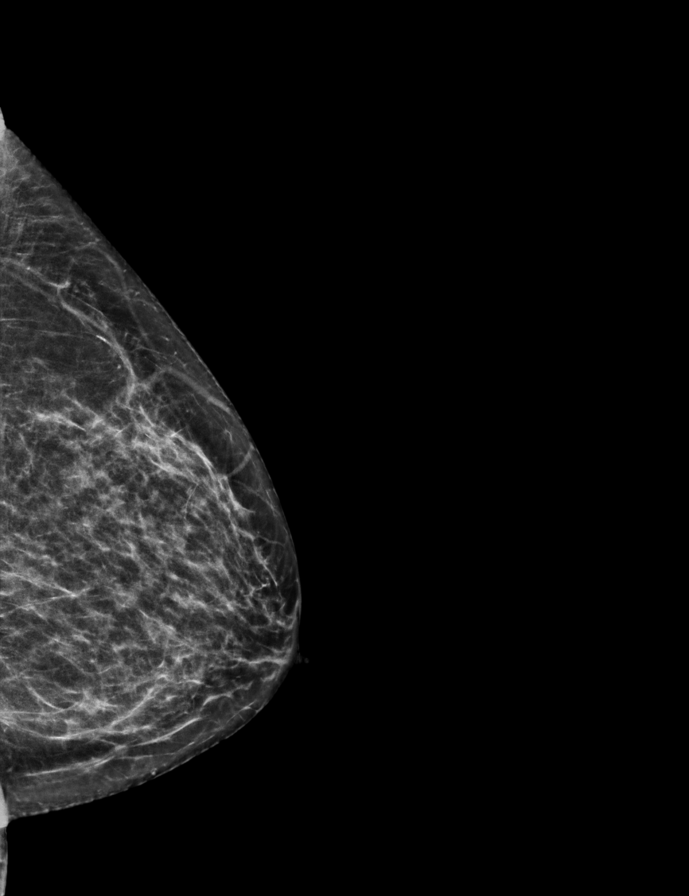

[L CC synth-2D (2 of 2)]
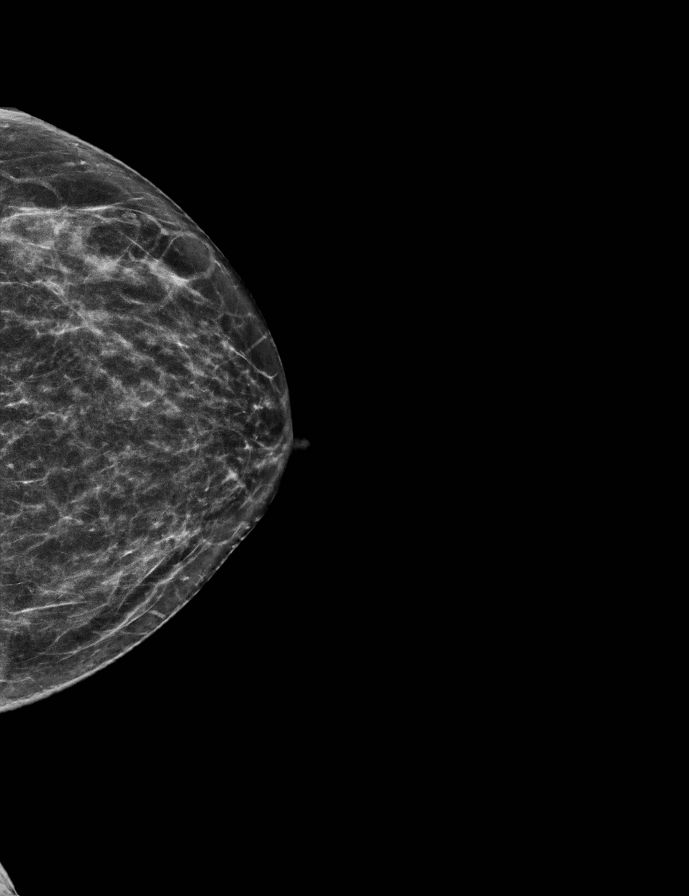

[L CC tomo · 2 of 53 frames shown (1 of 2)]
[frame 18/53]
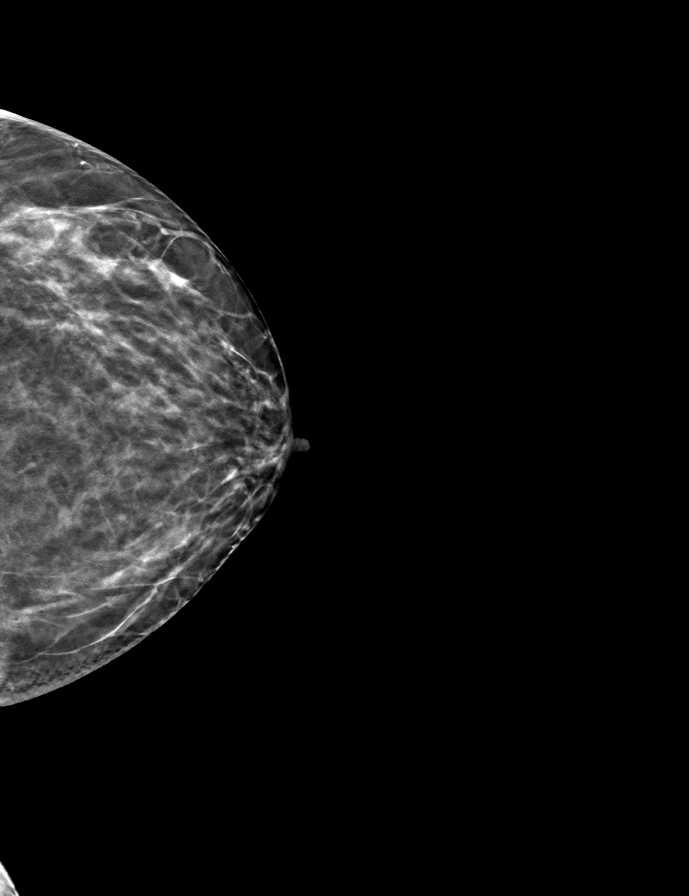
[frame 27/53]
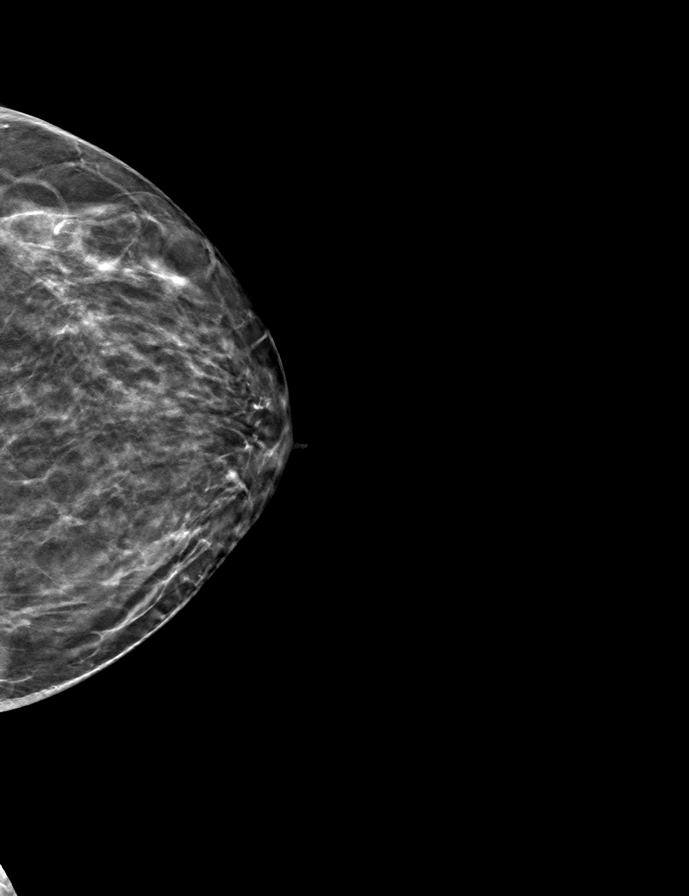

[L MLO tomo · tomo slice 28/55.0]
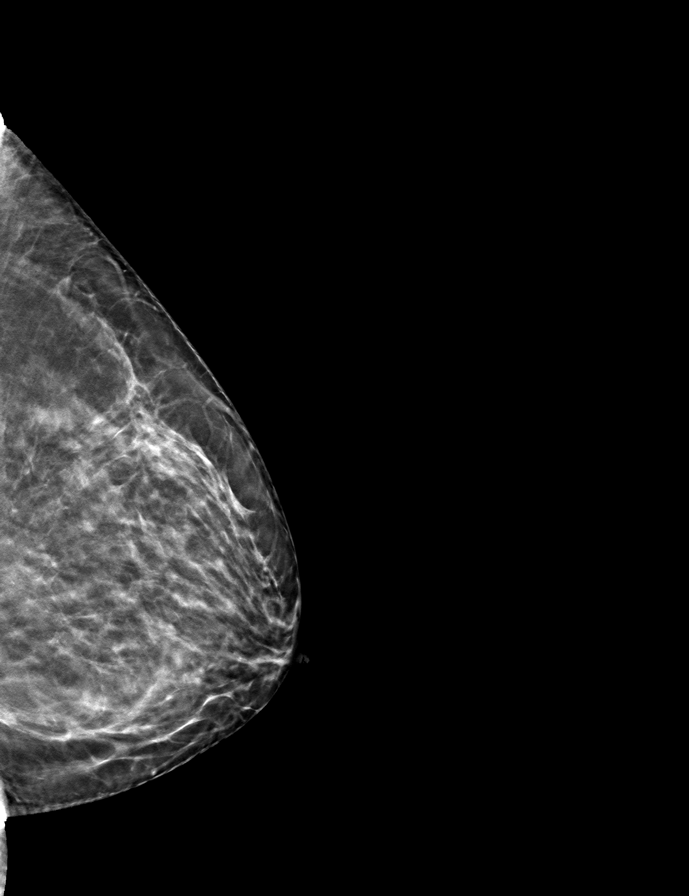

[L CC tomo (2 of 2) · tomo slice 29/57.0]
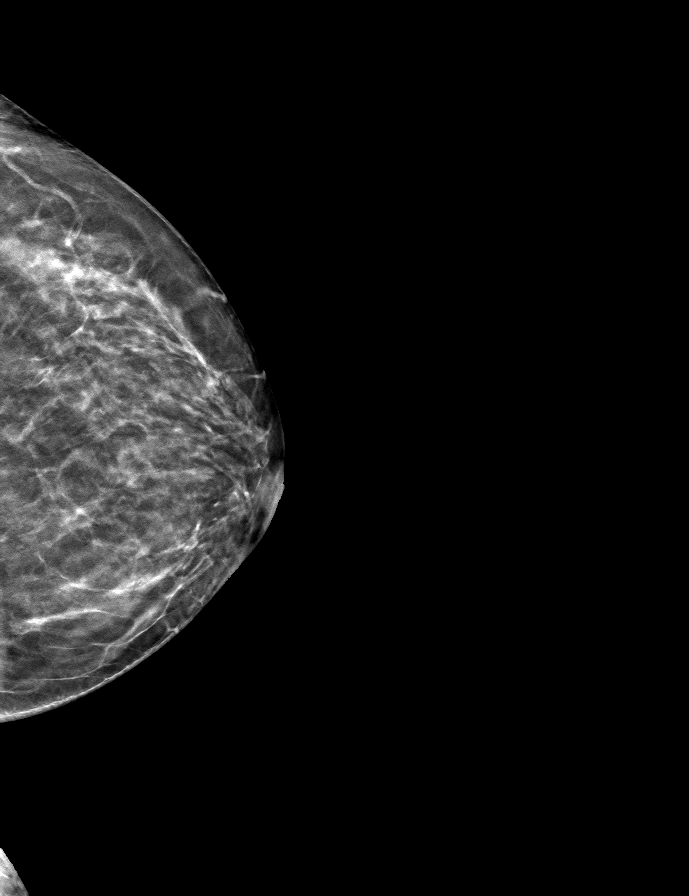

[L ML tomo · tomo slice 29/58.0]
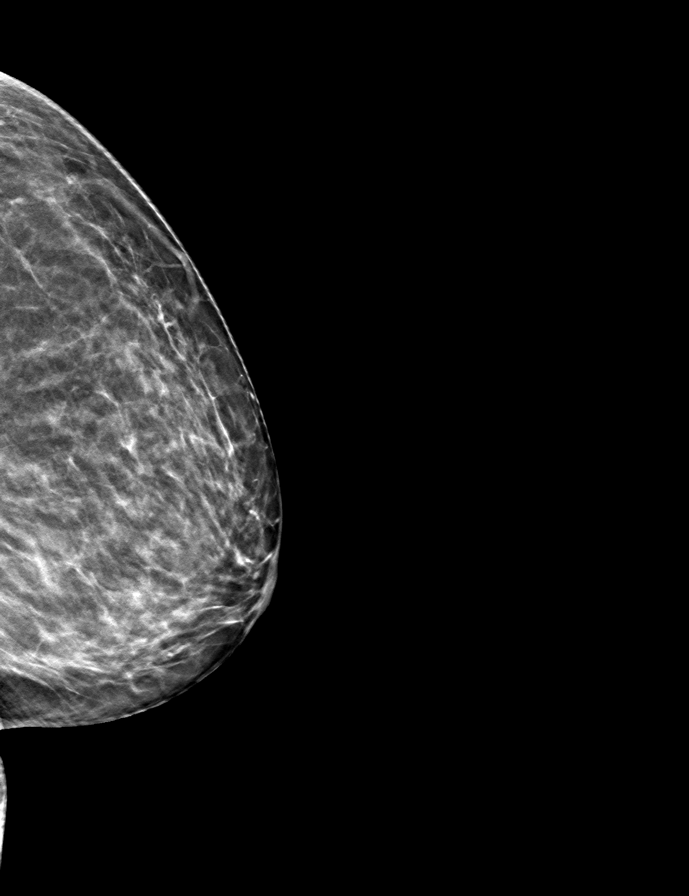

[9 of 24 positions shown; findings below may reference images not displayed]

ACR Breast Density Category c: The breast tissue is heterogeneously
dense, which may obscure small masses.
FINDINGS: Questioned distortion within the left breast resolved with
additional imaging compatible with dense overlapping fibroglandular
tissue. No suspicious findings.
IMPRESSION: No mammographic evidence for malignancy.

RECOMMENDATION:
Screening mammogram in one year.(Code:M1-8-EKH)

I have discussed the findings and recommendations with the patient.
If applicable, a reminder letter will be sent to the patient
regarding the next appointment.

BI-RADS CATEGORY  1: Negative.

## 2023-05-25 ENCOUNTER — Other Ambulatory Visit: Payer: Self-pay | Admitting: Emergency Medicine

## 2023-05-25 DIAGNOSIS — I10 Essential (primary) hypertension: Secondary | ICD-10-CM

## 2023-06-10 ENCOUNTER — Ambulatory Visit (INDEPENDENT_AMBULATORY_CARE_PROVIDER_SITE_OTHER)

## 2023-06-10 ENCOUNTER — Encounter: Payer: Self-pay | Admitting: Emergency Medicine

## 2023-06-10 ENCOUNTER — Ambulatory Visit (INDEPENDENT_AMBULATORY_CARE_PROVIDER_SITE_OTHER): Payer: Medicaid Other | Admitting: Emergency Medicine

## 2023-06-10 VITALS — BP 122/88 | HR 56 | Temp 98.4°F | Ht 59.0 in | Wt 131.0 lb

## 2023-06-10 DIAGNOSIS — E785 Hyperlipidemia, unspecified: Secondary | ICD-10-CM

## 2023-06-10 DIAGNOSIS — M25572 Pain in left ankle and joints of left foot: Secondary | ICD-10-CM

## 2023-06-10 DIAGNOSIS — M2550 Pain in unspecified joint: Secondary | ICD-10-CM | POA: Diagnosis not present

## 2023-06-10 DIAGNOSIS — G8114 Spastic hemiplegia affecting left nondominant side: Secondary | ICD-10-CM

## 2023-06-10 DIAGNOSIS — Z23 Encounter for immunization: Secondary | ICD-10-CM | POA: Diagnosis not present

## 2023-06-10 DIAGNOSIS — N1831 Chronic kidney disease, stage 3a: Secondary | ICD-10-CM

## 2023-06-10 DIAGNOSIS — Z8673 Personal history of transient ischemic attack (TIA), and cerebral infarction without residual deficits: Secondary | ICD-10-CM

## 2023-06-10 DIAGNOSIS — R7303 Prediabetes: Secondary | ICD-10-CM

## 2023-06-10 DIAGNOSIS — I10 Essential (primary) hypertension: Secondary | ICD-10-CM

## 2023-06-10 DIAGNOSIS — M25562 Pain in left knee: Secondary | ICD-10-CM | POA: Diagnosis not present

## 2023-06-10 MED ORDER — DAPAGLIFLOZIN PROPANEDIOL 10 MG PO TABS
10.0000 mg | ORAL_TABLET | Freq: Every day | ORAL | 5 refills | Status: AC
Start: 2023-06-10 — End: ?

## 2023-06-10 NOTE — Progress Notes (Signed)
 Barbara Strickland 63 y.o.   Chief Complaint  Patient presents with   Follow-up    6 month f/o for HTN. Patient c/o for her left knee and ankle she's been having pain that comes and goes that has been going on for awhile .     HISTORY OF PRESENT ILLNESS: This is a 63 y.o. female here for 38-month follow-up of hypertension. History of stroke and dyslipidemia Prediabetes and CKD Today complaining of pain to left ankle and left knee for many months No other complaint or any other medical concerns BP Readings from Last 3 Encounters:  06/10/23 122/88  12/11/22 130/74  08/29/22 (!) 175/118     HPI   Prior to Admission medications   Medication Sig Start Date End Date Taking? Authorizing Provider  amLODipine  (NORVASC ) 10 MG tablet TAKE 1 TABLET(10 MG) BY MOUTH DAILY 02/20/23  Yes Evon Dejarnett, Isidro Margo, MD  atorvastatin  (LIPITOR) 40 MG tablet TAKE 1 TABLET(40 MG) BY MOUTH DAILY 09/28/22  Yes Mialynn Shelvin Jose, MD  losartan  (COZAAR ) 50 MG tablet TAKE 1 TABLET(50 MG) BY MOUTH DAILY 05/26/23  Yes Brax Walen, Isidro Margo, MD  meloxicam  (MOBIC ) 7.5 MG tablet TAKE 1 TABLET(7.5 MG) BY MOUTH DAILY FOR 10 DAYS 07/18/22  Yes Elvira Hammersmith, MD    No Known Allergies  Patient Active Problem List   Diagnosis Date Noted   Chronic pain of left knee 06/12/2022   Prediabetes 03/14/2021   History of stroke 09/05/2020   History of kidney stones 09/05/2020   Prolonged QT interval 01/03/2020   Dyslipidemia 11/03/2019   Hx of adenomatous colonic polyps 08/05/2017   Left spastic hemiparesis (HCC) 05/15/2017   Essential hypertension 06/21/2015    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[facility])    Hemorrhagic stroke (HCC) 2009   History of kidney stones    Hx of adenomatous colonic polyps 08/05/2017   Hypertension    Illiterate    pt cannot read/write english, daughter chien h signs consents   Left spastic hemiparesis (HCC)    uses cane    Microcytic anemia    Tubular adenoma of colon     Past Surgical History:  Procedure Laterality Date   CYSTOSCOPY W/ URETERAL STENT PLACEMENT Right 01/03/2020   Procedure: CYSTOSCOPY WITH RETROGRADE PYELOGRAM/URETERAL STENT PLACEMENT;  Surgeon: Adelbert Homans, MD;  Location: [facility];  Service: Urology;  Laterality: Right;   CYSTOSCOPY/RETROGRADE/URETEROSCOPY/STONE EXTRACTION WITH BASKET Right 05/04/2020   Procedure: CYSTOSCOPY/RETROGRADE/URETEROSCOPY/ HOLMIUM LASER LITHOTRIPSY/STONE EXTRACTION WITH BASKET/ STENT EXCHANGE;  Surgeon: Adelbert Homans, MD;  Location: [facility];  Service: Urology;  Laterality: Right;   CYSTOSCOPY/URETEROSCOPY/HOLMIUM LASER/STENT PLACEMENT Right 03/09/2020   Procedure: CYSTOSCOPY/RETROGRADE/URETEROSCOPY/HOLMIUM LASER/STENT REPLACEMENT;  Surgeon: Adelbert Homans, MD;  Location: [facility];  Service: Urology;  Laterality: Right;   CYSTOSCOPY/URETEROSCOPY/HOLMIUM LASER/STENT PLACEMENT Right 03/23/2020   Procedure: CYSTOSCOPY/STENT REMOVAL, URETEROSCOPY/HOLMIUM LASER/STENT PLACEMENT;  Surgeon: Adelbert Homans, MD;  Location: [facility];  Service: Urology;  Laterality: Right;   HOLMIUM LASER APPLICATION Right 05/04/2020   Procedure: HOLMIUM LASER APPLICATION;  Surgeon: Adelbert Homans, MD;  Location: [facility];  Service: Urology;  Laterality: Right;    Social History   Socioeconomic History   Marital status: Married    Spouse name: Not on file   Number of children: 4   Years of education: Not on file   Highest education level: Not on file  Occupational History   Not  on file  Tobacco Use   Smoking status: Never   Smokeless tobacco: Never  Vaping Use   Vaping status: Never Used  Substance and Sexual Activity   Alcohol use: Never   Drug use: Never   Sexual activity: Not on file  Other Topics Concern   Not on file  Social History Narrative   **  Merged History Encounter **    No caffeine   Lives with daughter   One floor   Not working   Right handed   Social Drivers of Corporate investment banker Strain: Not on file  Food Insecurity: Not on file  Transportation Needs: Not on file  Physical Activity: Not on file  Stress: Not on file  Social Connections: Not on file  Intimate Partner Violence: Not on file    Family History  Problem Relation Age of Onset   Hypertension Neg Hx    Hyperlipidemia Neg Hx    Colon cancer Neg Hx    Colon polyps Neg Hx    Esophageal cancer Neg Hx    Rectal cancer Neg Hx    Stomach cancer Neg Hx    Breast cancer Neg Hx    BRCA 1/2 Neg Hx    Diabetes Neg Hx      Review of Systems  Constitutional: Negative.  Negative for chills and fever.  HENT: Negative.  Negative for congestion.   Respiratory: Negative.  Negative for cough and shortness of breath.   Cardiovascular: Negative.  Negative for chest pain and palpitations.  Gastrointestinal:  Negative for abdominal pain, diarrhea, nausea and vomiting.  Genitourinary: Negative.  Negative for dysuria and hematuria.  Musculoskeletal:  Positive for joint pain.  Skin: Negative.  Negative for rash.  Neurological: Negative.  Negative for dizziness and headaches.  All other systems reviewed and are negative.   Vitals:   06/10/23 0753  BP: 122/88  Pulse: (!) 56  Temp: 98.4 F (36.9 C)  SpO2: 98%    Physical Exam Vitals reviewed.  Constitutional:      Appearance: Normal appearance.  HENT:     Head: Normocephalic.  Eyes:     Extraocular Movements: Extraocular movements intact.     Pupils: Pupils are equal, round, and reactive to light.  Cardiovascular:     Rate and Rhythm: Normal rate and regular rhythm.     Pulses: Normal pulses.     Heart sounds: Normal heart sounds.  Pulmonary:     Effort: Pulmonary effort is normal.     Breath sounds: Normal breath sounds.  Musculoskeletal:     Cervical back: No tenderness.     Comments: Left  knee: Mild tenderness.  Full range of motion Left ankle: Mild tenderness with decreased range of motion  Lymphadenopathy:     Cervical: No cervical adenopathy.  Skin:    General: Skin is warm and dry.     Capillary Refill: Capillary refill takes less than 2 seconds.  Neurological:     Mental Status: She is alert and oriented to person, place, and time. Mental status is at baseline.     Comments: Baseline left-sided hemiparesis  Psychiatric:        Mood and Affect: Mood normal.        Behavior: Behavior normal.    DG Knee 1-2 Views Left Result Date: 06/10/2023 CLINICAL DATA:  Intermittent left knee pain for 6 months. EXAM: LEFT KNEE - 1-2 VIEW COMPARISON:  Left knee radiographs 06/12/2022 FINDINGS: There is diffuse decreased bone mineralization. No  joint effusion. Minimal chronic enthesopathic change at the quadriceps insertion on the patella. No significant joint space narrowing. Minimal tricompartmental peripheral degenerative spurring. No acute fracture or dislocation. IMPRESSION: Minimal tricompartmental peripheral degenerative spurring, similar prior. Overall mild osteoarthritis. Electronically Signed   By: Bertina Broccoli M.D.   On: 06/10/2023 08:57   DG Ankle Complete Left Result Date: 06/10/2023 CLINICAL DATA:  Intermittent left knee and ankle pain for 6 months. EXAM: LEFT ANKLE COMPLETE - 3+ VIEW COMPARISON:  Left ankle radiographs 07/30/2022 FINDINGS: There is diffuse decreased bone mineralization. Tiny plantar and posterior calcaneal heel spurs are similar to prior. Mild dorsal tarsometatarsal states on lateral view, similar to prior. Mild to moderate distal medial and lateral malleolar degenerate spurring is similar prior. No acute fracture or dislocation. Large body habitus. Overall interval decrease in the thickness of the lateral ankle soft tissues. IMPRESSION: 1. No acute fracture or dislocation. 2. Mild to moderate distal medial and lateral malleolar degenerative spurring, similar to  prior. 3. Tiny plantar and posterior calcaneal heel spurs, similar to prior. Electronically Signed   By: Bertina Broccoli M.D.   On: 06/10/2023 08:54     ASSESSMENT & PLAN: A total of 45 minutes was spent with the patient and counseling/coordination of care regarding preparing for this visit, review of most recent office visit notes, review of multiple chronic medical conditions and their management, cardiovascular risks associated with hypertension, dyslipidemia, stroke prevention measures, diagnosis of chronic kidney disease and need for Farxiga, review of all medications and changes made, review of most recent bloodwork results, review of health maintenance items and vaccination update, education on nutrition, prognosis, documentation, and need for follow up.   Problem List Items Addressed This Visit       Cardiovascular and Mediastinum   Essential hypertension - Primary   Normotensive.  Well-controlled hypertension Continue amlodipine  10 mg daily and losartan  50 mg daily Cardiovascular risks associated with hypertension discussed Dietary approaches to stop hypertension discussed         Nervous and Auditory   Left spastic hemiparesis (HCC)   As a result of nontraumatic hemorrhagic stroke Stable.  No concerns.        Genitourinary   Stage 3a chronic kidney disease (HCC)   Chronic stable condition.  Advised to stay well-hydrated and avoid NSAIDs as much as possible Recommend to start Farxiga 10 mg daily      Relevant Medications   dapagliflozin propanediol (FARXIGA) 10 MG TABS tablet     Other   Dyslipidemia   Diet and nutrition discussed Continue atorvastatin  40 mg daily      History of stroke   Secondary stroke prevention measures discussed Well-controlled hypertension On blood pressure and cholesterol medication Lipid profile done today Non-smoker Prediabetic      Prediabetes   Diet and nutrition discussed We will follow-up in 6 months      Arthralgia of  multiple joints   Mostly left knee and left ankle Recommend x-rays today.  Will review images when ready Pain management discussed Recommend Tylenol  Recommend to avoid NSAIDs is much as possible      Relevant Orders   DG Knee 1-2 Views Left   DG Ankle Complete Left   Patient Instructions  Health Maintenance After Age 36 After age 85, you are at a higher risk for certain long-term diseases and infections as well as injuries from falls. Falls are a major cause of broken bones and head injuries in people who are older than age 29. Getting  regular preventive care can help to keep you healthy and well. Preventive care includes getting regular testing and making lifestyle changes as recommended by your health care provider. Talk with your health care provider about: Which screenings and tests you should have. A screening is a test that checks for a disease when you have no symptoms. A diet and exercise plan that is right for you. What should I know about screenings and tests to prevent falls? Screening and testing are the best ways to find a health problem early. Early diagnosis and treatment give you the best chance of managing medical conditions that are common after age 10. Certain conditions and lifestyle choices may make you more likely to have a fall. Your health care provider may recommend: Regular vision checks. Poor vision and conditions such as cataracts can make you more likely to have a fall. If you wear glasses, make sure to get your prescription updated if your vision changes. Medicine review. Work with your health care provider to regularly review all of the medicines you are taking, including over-the-counter medicines. Ask your health care provider about any side effects that may make you more likely to have a fall. Tell your health care provider if any medicines that you take make you feel dizzy or sleepy. Strength and balance checks. Your health care provider may recommend certain  tests to check your strength and balance while standing, walking, or changing positions. Foot health exam. Foot pain and numbness, as well as not wearing proper footwear, can make you more likely to have a fall. Screenings, including: Osteoporosis screening. Osteoporosis is a condition that causes the bones to get weaker and break more easily. Blood pressure screening. Blood pressure changes and medicines to control blood pressure can make you feel dizzy. Depression screening. You may be more likely to have a fall if you have a fear of falling, feel depressed, or feel unable to do activities that you used to do. Alcohol use screening. Using too much alcohol can affect your balance and may make you more likely to have a fall. Follow these instructions at home: Lifestyle Do not drink alcohol if: Your health care provider tells you not to drink. If you drink alcohol: Limit how much you have to: 0-1 drink a day for women. 0-2 drinks a day for men. Know how much alcohol is in your drink. In the [place] one drink equals one 12 oz bottle of beer (355 mL), one 5 oz glass of wine (148 mL), or one 1 oz glass of hard liquor (44 mL). Do not use any products that contain nicotine or tobacco. These products include cigarettes, chewing tobacco, and vaping devices, such as e-cigarettes. If you need help quitting, ask your health care provider. Activity  Follow a regular exercise program to stay fit. This will help you maintain your balance. Ask your health care provider what types of exercise are appropriate for you. If you need a cane or walker, use it as recommended by your health care provider. Wear supportive shoes that have nonskid soles. Safety  Remove any tripping hazards, such as rugs, cords, and clutter. Install safety equipment such as grab bars in bathrooms and safety rails on stairs. Keep rooms and walkways well-lit. General instructions Talk with your health care provider about your risks for  falling. Tell your health care provider if: You fall. Be sure to tell your health care provider about all falls, even ones that seem minor. You feel dizzy, tiredness (fatigue), or off-balance.  Take over-the-counter and prescription medicines only as told by your health care provider. These include supplements. Eat a healthy diet and maintain a healthy weight. A healthy diet includes low-fat dairy products, low-fat (lean) meats, and fiber from whole grains, beans, and lots of fruits and vegetables. Stay current with your vaccines. Schedule regular health, dental, and eye exams. Summary Having a healthy lifestyle and getting preventive care can help to protect your health and wellness after age 55. Screening and testing are the best way to find a health problem early and help you avoid having a fall. Early diagnosis and treatment give you the best chance for managing medical conditions that are more common for people who are older than age 47. Falls are a major cause of broken bones and head injuries in people who are older than age 7. Take precautions to prevent a fall at home. Work with your health care provider to learn what changes you can make to improve your health and wellness and to prevent falls. This information is not intended to replace advice given to you by your health care provider. Make sure you discuss any questions you have with your health care provider. Document Revised: 06/13/2020 Document Reviewed: 06/13/2020 Elsevier Patient Education  2024 Elsevier Inc.    Maryagnes Small, MD [facility]

## 2023-06-10 NOTE — Assessment & Plan Note (Signed)
 Diet and nutrition discussed. Continue atorvastatin 40 mg daily.

## 2023-06-10 NOTE — Assessment & Plan Note (Signed)
 Mostly left knee and left ankle Recommend x-rays today.  Will review images when ready Pain management discussed Recommend Tylenol  Recommend to avoid NSAIDs is much as possible

## 2023-06-10 NOTE — Assessment & Plan Note (Signed)
 Diet and nutrition discussed We will follow-up in 6 months

## 2023-06-10 NOTE — Assessment & Plan Note (Signed)
 Chronic stable condition.  Advised to stay well-hydrated and avoid NSAIDs as much as possible Recommend to start Farxiga 10 mg daily

## 2023-06-10 NOTE — Patient Instructions (Signed)
 Health Maintenance After Age 65 After age 4, you are at a higher risk for certain long-term diseases and infections as well as injuries from falls. Falls are a major cause of broken bones and head injuries in people who are older than age 47. Getting regular preventive care can help to keep you healthy and well. Preventive care includes getting regular testing and making lifestyle changes as recommended by your health care provider. Talk with your health care provider about: Which screenings and tests you should have. A screening is a test that checks for a disease when you have no symptoms. A diet and exercise plan that is right for you. What should I know about screenings and tests to prevent falls? Screening and testing are the best ways to find a health problem early. Early diagnosis and treatment give you the best chance of managing medical conditions that are common after age 37. Certain conditions and lifestyle choices may make you more likely to have a fall. Your health care provider may recommend: Regular vision checks. Poor vision and conditions such as cataracts can make you more likely to have a fall. If you wear glasses, make sure to get your prescription updated if your vision changes. Medicine review. Work with your health care provider to regularly review all of the medicines you are taking, including over-the-counter medicines. Ask your health care provider about any side effects that may make you more likely to have a fall. Tell your health care provider if any medicines that you take make you feel dizzy or sleepy. Strength and balance checks. Your health care provider may recommend certain tests to check your strength and balance while standing, walking, or changing positions. Foot health exam. Foot pain and numbness, as well as not wearing proper footwear, can make you more likely to have a fall. Screenings, including: Osteoporosis screening. Osteoporosis is a condition that causes  the bones to get weaker and break more easily. Blood pressure screening. Blood pressure changes and medicines to control blood pressure can make you feel dizzy. Depression screening. You may be more likely to have a fall if you have a fear of falling, feel depressed, or feel unable to do activities that you used to do. Alcohol use screening. Using too much alcohol can affect your balance and may make you more likely to have a fall. Follow these instructions at home: Lifestyle Do not drink alcohol if: Your health care provider tells you not to drink. If you drink alcohol: Limit how much you have to: 0-1 drink a day for women. 0-2 drinks a day for men. Know how much alcohol is in your drink. In the [place] one drink equals one 12 oz bottle of beer (355 mL), one 5 oz glass of wine (148 mL), or one 1 oz glass of hard liquor (44 mL). Do not use any products that contain nicotine or tobacco. These products include cigarettes, chewing tobacco, and vaping devices, such as e-cigarettes. If you need help quitting, ask your health care provider. Activity  Follow a regular exercise program to stay fit. This will help you maintain your balance. Ask your health care provider what types of exercise are appropriate for you. If you need a cane or walker, use it as recommended by your health care provider. Wear supportive shoes that have nonskid soles. Safety  Remove any tripping hazards, such as rugs, cords, and clutter. Install safety equipment such as grab bars in bathrooms and safety rails on stairs. Keep rooms and walkways  well-lit. General instructions Talk with your health care provider about your risks for falling. Tell your health care provider if: You fall. Be sure to tell your health care provider about all falls, even ones that seem minor. You feel dizzy, tiredness (fatigue), or off-balance. Take over-the-counter and prescription medicines only as told by your health care provider. These include  supplements. Eat a healthy diet and maintain a healthy weight. A healthy diet includes low-fat dairy products, low-fat (lean) meats, and fiber from whole grains, beans, and lots of fruits and vegetables. Stay current with your vaccines. Schedule regular health, dental, and eye exams. Summary Having a healthy lifestyle and getting preventive care can help to protect your health and wellness after age 11. Screening and testing are the best way to find a health problem early and help you avoid having a fall. Early diagnosis and treatment give you the best chance for managing medical conditions that are more common for people who are older than age 28. Falls are a major cause of broken bones and head injuries in people who are older than age 48. Take precautions to prevent a fall at home. Work with your health care provider to learn what changes you can make to improve your health and wellness and to prevent falls. This information is not intended to replace advice given to you by your health care provider. Make sure you discuss any questions you have with your health care provider. Document Revised: 06/13/2020 Document Reviewed: 06/13/2020 Elsevier Patient Education  2024 [employer].

## 2023-06-10 NOTE — Assessment & Plan Note (Signed)
 Secondary stroke prevention measures discussed Well-controlled hypertension On blood pressure and cholesterol medication Lipid profile done today Non-smoker Prediabetic

## 2023-06-10 NOTE — Assessment & Plan Note (Signed)
 Normotensive.  Well-controlled hypertension Continue amlodipine  10 mg daily and losartan  50 mg daily Cardiovascular risks associated with hypertension discussed Dietary approaches to stop hypertension discussed

## 2023-06-10 NOTE — Assessment & Plan Note (Signed)
 As a result of nontraumatic hemorrhagic stroke Stable.  No concerns.

## 2023-06-12 ENCOUNTER — Other Ambulatory Visit (HOSPITAL_COMMUNITY): Payer: Self-pay

## 2023-06-12 ENCOUNTER — Telehealth: Payer: Self-pay

## 2023-06-12 NOTE — Telephone Encounter (Signed)
 Pharmacy Patient Advocate Encounter   Received notification from CoverMyMeds that prior authorization for FARXIGA is required/requested.   Insurance verification completed.   The patient is insured through [facility] .   Per test claim: PA required; PA submitted to above mentioned insurance via CoverMyMeds Key/confirmation #/EOC (Key: [id number])    Status is pending

## 2023-06-13 ENCOUNTER — Encounter: Payer: Self-pay | Admitting: Radiology

## 2023-06-13 NOTE — Telephone Encounter (Signed)
 Pharmacy Patient Advocate Encounter  Received notification from [employer] that Prior Authorization for Barbara Strickland has been DENIED.  Full denial letter will be uploaded to the media tab. See denial reason below.   Pts plan will only cover Farxiga for type2 diabetes and/or Hear failure. Please advise     PA #/Case ID/Reference #: (Key: [id number])

## 2023-06-18 ENCOUNTER — Telehealth: Payer: Self-pay | Admitting: Emergency Medicine

## 2023-06-18 NOTE — Telephone Encounter (Signed)
 Pt's daughter has dropped off Med. Certification for Disability Exception forms and they have been placed in PCP's box.   Please call pt's daughter when forms are ready for pick up: [phone]

## 2023-06-19 NOTE — Telephone Encounter (Signed)
 Forms received will place in providers office to be reviewed and completed if appropriate

## 2023-06-20 NOTE — Telephone Encounter (Signed)
 LVM for daughter to call back regarding form. Needing additional information to complete form

## 2023-08-21 ENCOUNTER — Other Ambulatory Visit: Payer: Self-pay | Admitting: Emergency Medicine

## 2023-08-21 DIAGNOSIS — I10 Essential (primary) hypertension: Secondary | ICD-10-CM

## 2023-10-09 ENCOUNTER — Ambulatory Visit: Payer: Self-pay | Admitting: Emergency Medicine

## 2023-12-09 ENCOUNTER — Ambulatory Visit: Payer: Self-pay | Admitting: Emergency Medicine

## 2023-12-09 ENCOUNTER — Encounter: Payer: Self-pay | Admitting: Emergency Medicine

## 2023-12-09 VITALS — BP 120/70 | HR 65 | Temp 98.0°F | Ht 59.0 in | Wt 128.0 lb

## 2023-12-09 DIAGNOSIS — I10 Essential (primary) hypertension: Secondary | ICD-10-CM

## 2023-12-09 DIAGNOSIS — Z8673 Personal history of transient ischemic attack (TIA), and cerebral infarction without residual deficits: Secondary | ICD-10-CM

## 2023-12-09 DIAGNOSIS — M2550 Pain in unspecified joint: Secondary | ICD-10-CM

## 2023-12-09 DIAGNOSIS — N1831 Chronic kidney disease, stage 3a: Secondary | ICD-10-CM

## 2023-12-09 DIAGNOSIS — E785 Hyperlipidemia, unspecified: Secondary | ICD-10-CM

## 2023-12-09 DIAGNOSIS — R7303 Prediabetes: Secondary | ICD-10-CM

## 2023-12-09 DIAGNOSIS — G8114 Spastic hemiplegia affecting left nondominant side: Secondary | ICD-10-CM

## 2023-12-09 LAB — CBC WITH DIFFERENTIAL/PLATELET
Basophils Absolute: 0.1 K/uL (ref 0.0–0.1)
Basophils Relative: 1.2 % (ref 0.0–3.0)
Eosinophils Absolute: 0.3 K/uL (ref 0.0–0.7)
Eosinophils Relative: 4.3 % (ref 0.0–5.0)
HCT: 39.5 % (ref 36.0–46.0)
Hemoglobin: 12.6 g/dL (ref 12.0–15.0)
Lymphocytes Relative: 32.2 % (ref 12.0–46.0)
Lymphs Abs: 2 K/uL (ref 0.7–4.0)
MCHC: 31.9 g/dL (ref 30.0–36.0)
MCV: 66.3 fl — ABNORMAL LOW (ref 78.0–100.0)
Monocytes Absolute: 0.5 K/uL (ref 0.1–1.0)
Monocytes Relative: 8 % (ref 3.0–12.0)
Neutro Abs: 3.4 K/uL (ref 1.4–7.7)
Neutrophils Relative %: 54.3 % (ref 43.0–77.0)
Platelets: 150 K/uL (ref 150.0–400.0)
RBC: 5.95 Mil/uL — ABNORMAL HIGH (ref 3.87–5.11)
RDW: 15.4 % (ref 11.5–15.5)
WBC: 6.2 K/uL (ref 4.0–10.5)

## 2023-12-09 LAB — LIPID PANEL
Cholesterol: 194 mg/dL (ref 0–200)
HDL: 86.5 mg/dL (ref >39.00)
LDL Cholesterol: 94 mg/dL (ref 0–99)
NonHDL: 107.9
Total CHOL/HDL Ratio: 2
Triglycerides: 71 mg/dL (ref 0.0–149.0)
VLDL: 14.2 mg/dL (ref 0.0–40.0)

## 2023-12-09 LAB — POCT GLYCOSYLATED HEMOGLOBIN (HGB A1C): HbA1c POC (<> result, manual entry): 6 % (ref 4.0–5.6)

## 2023-12-09 LAB — COMPREHENSIVE METABOLIC PANEL WITH GFR
ALT: 20 U/L (ref 0–35)
AST: 23 U/L (ref 0–37)
Albumin: 4.2 g/dL (ref 3.5–5.2)
Alkaline Phosphatase: 50 U/L (ref 39–117)
BUN: 18 mg/dL (ref 6–23)
CO2: 25 meq/L (ref 19–32)
Calcium: 9.2 mg/dL (ref 8.4–10.5)
Chloride: 106 meq/L (ref 96–112)
Creatinine, Ser: 0.88 mg/dL (ref 0.40–1.20)
GFR: 68.76 mL/min (ref >60.00)
Glucose, Bld: 86 mg/dL (ref 70–99)
Potassium: 3.8 meq/L (ref 3.5–5.1)
Sodium: 141 meq/L (ref 135–145)
Total Bilirubin: 1 mg/dL (ref 0.2–1.2)
Total Protein: 7.6 g/dL (ref 6.0–8.3)

## 2023-12-09 NOTE — Progress Notes (Signed)
 Barbara Strickland 63 y.o.   Chief Complaint  Patient presents with   Follow-up    6 months/  left knee pain about a month now. Pt does not take anything over the counter to help    HISTORY OF PRESENT ILLNESS: This is a 63 y.o. female here for 32-month follow-up of chronic medical conditions including hypertension Accompanied by daughter and translator Overall doing well. Chronic complaint of pain to both knees No other complaints or medical concerns today.  HPI   Prior to Admission medications   Medication Sig Start Date End Date Taking? Authorizing Provider  amLODipine  (NORVASC ) 10 MG tablet TAKE 1 TABLET(10 MG) BY MOUTH DAILY 02/20/23  Yes Nicklous Aburto, Emil Schanz, MD  atorvastatin  (LIPITOR) 40 MG tablet TAKE 1 TABLET(40 MG) BY MOUTH DAILY 09/28/22  Yes Anndrea Mihelich Jose, MD  dapagliflozin  propanediol (FARXIGA ) 10 MG TABS tablet Take 1 tablet (10 mg total) by mouth daily before breakfast. 06/10/23  Yes Kallyn Demarcus, Emil Schanz, MD  losartan  (COZAAR ) 50 MG tablet TAKE 1 TABLET(50 MG) BY MOUTH DAILY 08/21/23  Yes Tacarra Justo, Emil Schanz, MD  meloxicam  (MOBIC ) 7.5 MG tablet TAKE 1 TABLET(7.5 MG) BY MOUTH DAILY FOR 10 DAYS 07/18/22  Yes Purcell Emil Schanz, MD    No Known Allergies  Patient Active Problem List   Diagnosis Date Noted   Arthralgia of multiple joints 06/10/2023   Stage 3a chronic kidney disease (HCC) 06/10/2023   Chronic pain of left knee 06/12/2022   Prediabetes 03/14/2021   History of stroke 09/05/2020   History of kidney stones 09/05/2020   Prolonged QT interval 01/03/2020   Dyslipidemia 11/03/2019   Hx of adenomatous colonic polyps 08/05/2017   Left spastic hemiparesis (HCC) 05/15/2017   Essential hypertension 06/21/2015    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Hemorrhagic stroke [facility]) 2009   History of kidney stones    Hx of adenomatous colonic polyps 08/05/2017   Hypertension    Illiterate    pt cannot  read/write english, daughter chien h signs consents   Left spastic hemiparesis (HCC)    uses cane   Microcytic anemia    Tubular adenoma of colon     Past Surgical History:  Procedure Laterality Date   CYSTOSCOPY W/ URETERAL STENT PLACEMENT Right 01/03/2020   Procedure: CYSTOSCOPY WITH RETROGRADE PYELOGRAM/URETERAL STENT PLACEMENT;  Surgeon: Devere Lonni Righter, MD;  Location: [facility];  Service: Urology;  Laterality: Right;   CYSTOSCOPY/RETROGRADE/URETEROSCOPY/STONE EXTRACTION WITH BASKET Right 05/04/2020   Procedure: CYSTOSCOPY/RETROGRADE/URETEROSCOPY/ HOLMIUM LASER LITHOTRIPSY/STONE EXTRACTION WITH BASKET/ STENT EXCHANGE;  Surgeon: Devere Lonni Righter, MD;  Location: [facility];  Service: Urology;  Laterality: Right;   CYSTOSCOPY/URETEROSCOPY/HOLMIUM LASER/STENT PLACEMENT Right 03/09/2020   Procedure: CYSTOSCOPY/RETROGRADE/URETEROSCOPY/HOLMIUM LASER/STENT REPLACEMENT;  Surgeon: Devere Lonni Righter, MD;  Location: [facility];  Service: Urology;  Laterality: Right;   CYSTOSCOPY/URETEROSCOPY/HOLMIUM LASER/STENT PLACEMENT Right 03/23/2020   Procedure: CYSTOSCOPY/STENT REMOVAL, URETEROSCOPY/HOLMIUM LASER/STENT PLACEMENT;  Surgeon: Devere Lonni Righter, MD;  Location: [facility];  Service: Urology;  Laterality: Right;   HOLMIUM LASER APPLICATION Right 05/04/2020   Procedure: HOLMIUM LASER APPLICATION;  Surgeon: Devere Lonni Righter, MD;  Location: [facility];  Service: Urology;  Laterality: Right;    Social History   Socioeconomic History   Marital status: Married    Spouse name: Not on file   Number of children: 4   Years of education: Not on file   Highest education level:  Not on file  Occupational History   Not on file  Tobacco Use   Smoking status: Never   Smokeless tobacco: Never  Vaping Use   Vaping status: Never Used  Substance and Sexual Activity   Alcohol use: Never   Drug use: Never    Sexual activity: Not on file  Other Topics Concern   Not on file  Social History Narrative   ** Merged History Encounter **    No caffeine   Lives with daughter   One floor   Not working   Right handed   Social Drivers of Corporate Investment Banker Strain: Not on file  Food Insecurity: Not on file  Transportation Needs: Not on file  Physical Activity: Not on file  Stress: Not on file  Social Connections: Not on file  Intimate Partner Violence: Not on file    Family History  Problem Relation Age of Onset   Hypertension Neg Hx    Hyperlipidemia Neg Hx    Colon cancer Neg Hx    Colon polyps Neg Hx    Esophageal cancer Neg Hx    Rectal cancer Neg Hx    Stomach cancer Neg Hx    Breast cancer Neg Hx    BRCA 1/2 Neg Hx    Diabetes Neg Hx      Review of Systems  Constitutional: Negative.  Negative for chills and fever.  HENT: Negative.  Negative for congestion and sore throat.   Respiratory: Negative.  Negative for cough and shortness of breath.   Cardiovascular: Negative.  Negative for chest pain and palpitations.  Gastrointestinal:  Negative for abdominal pain, diarrhea, nausea and vomiting.  Genitourinary: Negative.  Negative for dysuria and hematuria.  Musculoskeletal:  Positive for joint pain.  Skin: Negative.   Neurological: Negative.  Negative for dizziness and headaches.  All other systems reviewed and are negative.   Vitals:   12/09/23 0755 12/09/23 0813  BP: 134/84 120/70  Pulse: 65   Temp: 98 F (36.7 C)   SpO2: 97%     Physical Exam Vitals reviewed.  Constitutional:      Appearance: Normal appearance.  HENT:     Head: Normocephalic.     Mouth/Throat:     Mouth: Mucous membranes are moist.     Pharynx: Oropharynx is clear.  Eyes:     Conjunctiva/sclera: Conjunctivae normal.     Pupils: Pupils are equal, round, and reactive to light.  Cardiovascular:     Rate and Rhythm: Normal rate and regular rhythm.     Pulses: Normal pulses.     Heart  sounds: Normal heart sounds.  Pulmonary:     Effort: Pulmonary effort is normal.     Breath sounds: Normal breath sounds.  Abdominal:     Palpations: Abdomen is soft.     Tenderness: There is no abdominal tenderness.  Musculoskeletal:     Cervical back: No tenderness.  Lymphadenopathy:     Cervical: No cervical adenopathy.  Skin:    General: Skin is warm and dry.     Capillary Refill: Capillary refill takes less than 2 seconds.  Neurological:     Mental Status: She is alert and oriented to person, place, and time. Mental status is at baseline.  Psychiatric:        Mood and Affect: Mood normal.        Behavior: Behavior normal.      ASSESSMENT & PLAN: A total of 40 minutes was spent with the patient  and counseling/coordination of care regarding preparing for this visit, review of most recent office visit notes, review of multiple chronic medical conditions and their management, cardiovascular risks associated with hypertension, pain management and need to avoid NSAIDs is much as possible, review of all medications, review of most recent bloodwork results, review of health maintenance items, education on nutrition, prognosis, documentation, and need for follow up.    Problem List Items Addressed This Visit       Cardiovascular and Mediastinum   Essential hypertension - Primary   BP Readings from Last 3 Encounters:  12/09/23 134/84  06/10/23 122/88  12/11/22 130/74  Normotensive.  Well-controlled hypertension Continue amlodipine  10 mg daily and losartan  50 mg daily Cardiovascular risks associated with hypertension discussed Dietary approaches to stop hypertension discussed         Relevant Orders   Comprehensive metabolic panel with GFR   CBC with Differential/Platelet     Nervous and Auditory   Left spastic hemiparesis (HCC)   As a result of nontraumatic hemorrhagic stroke Stable.  No concerns        Genitourinary   Stage 3a chronic kidney disease (HCC)    Chronic stable condition.  Advised to stay well-hydrated and avoid NSAIDs as much as possible Continue Farxiga  10 mg daily      Relevant Orders   Comprehensive metabolic panel with GFR     Other   Dyslipidemia   Diet and nutrition discussed Continue atorvastatin  40 mg daily      Relevant Orders   Lipid panel   History of stroke   Secondary stroke prevention measures discussed Well-controlled hypertension On blood pressure and cholesterol medication Lipid profile done today Non-smoker Prediabetic      Prediabetes   Diet and nutrition discussed We will follow-up in 6 months      Relevant Orders   POCT HgB A1C (Completed)   Lipid panel   Arthralgia of multiple joints   Mostly left knee and left ankle Recommend x-rays today.  Will review images when ready Pain management discussed Recommend Tylenol  Recommend to avoid NSAIDs is much as possible      Relevant Orders   CBC with Differential/Platelet   Patient Instructions  Health Maintenance After Age 6 After age 22, you are at a higher risk for certain long-term diseases and infections as well as injuries from falls. Falls are a major cause of broken bones and head injuries in people who are older than age 17. Getting regular preventive care can help to keep you healthy and well. Preventive care includes getting regular testing and making lifestyle changes as recommended by your health care provider. Talk with your health care provider about: Which screenings and tests you should have. A screening is a test that checks for a disease when you have no symptoms. A diet and exercise plan that is right for you. What should I know about screenings and tests to prevent falls? Screening and testing are the best ways to find a health problem early. Early diagnosis and treatment give you the best chance of managing medical conditions that are common after age 12. Certain conditions and lifestyle choices may make you more likely to  have a fall. Your health care provider may recommend: Regular vision checks. Poor vision and conditions such as cataracts can make you more likely to have a fall. If you wear glasses, make sure to get your prescription updated if your vision changes. Medicine review. Work with your health care provider to regularly  review all of the medicines you are taking, including over-the-counter medicines. Ask your health care provider about any side effects that may make you more likely to have a fall. Tell your health care provider if any medicines that you take make you feel dizzy or sleepy. Strength and balance checks. Your health care provider may recommend certain tests to check your strength and balance while standing, walking, or changing positions. Foot health exam. Foot pain and numbness, as well as not wearing proper footwear, can make you more likely to have a fall. Screenings, including: Osteoporosis screening. Osteoporosis is a condition that causes the bones to get weaker and break more easily. Blood pressure screening. Blood pressure changes and medicines to control blood pressure can make you feel dizzy. Depression screening. You may be more likely to have a fall if you have a fear of falling, feel depressed, or feel unable to do activities that you used to do. Alcohol use screening. Using too much alcohol can affect your balance and may make you more likely to have a fall. Follow these instructions at home: Lifestyle Do not drink alcohol if: Your health care provider tells you not to drink. If you drink alcohol: Limit how much you have to: 0-1 drink a day for women. 0-2 drinks a day for men. Know how much alcohol is in your drink. In the [place] one drink equals one 12 oz bottle of beer (355 mL), one 5 oz glass of wine (148 mL), or one 1 oz glass of hard liquor (44 mL). Do not use any products that contain nicotine or tobacco. These products include cigarettes, chewing tobacco, and vaping  devices, such as e-cigarettes. If you need help quitting, ask your health care provider. Activity  Follow a regular exercise program to stay fit. This will help you maintain your balance. Ask your health care provider what types of exercise are appropriate for you. If you need a cane or walker, use it as recommended by your health care provider. Wear supportive shoes that have nonskid soles. Safety  Remove any tripping hazards, such as rugs, cords, and clutter. Install safety equipment such as grab bars in bathrooms and safety rails on stairs. Keep rooms and walkways well-lit. General instructions Talk with your health care provider about your risks for falling. Tell your health care provider if: You fall. Be sure to tell your health care provider about all falls, even ones that seem minor. You feel dizzy, tiredness (fatigue), or off-balance. Take over-the-counter and prescription medicines only as told by your health care provider. These include supplements. Eat a healthy diet and maintain a healthy weight. A healthy diet includes low-fat dairy products, low-fat (lean) meats, and fiber from whole grains, beans, and lots of fruits and vegetables. Stay current with your vaccines. Schedule regular health, dental, and eye exams. Summary Having a healthy lifestyle and getting preventive care can help to protect your health and wellness after age 77. Screening and testing are the best way to find a health problem early and help you avoid having a fall. Early diagnosis and treatment give you the best chance for managing medical conditions that are more common for people who are older than age 35. Falls are a major cause of broken bones and head injuries in people who are older than age 44. Take precautions to prevent a fall at home. Work with your health care provider to learn what changes you can make to improve your health and wellness and to prevent falls. This  information is not intended to  replace advice given to you by your health care provider. Make sure you discuss any questions you have with your health care provider. Document Revised: 06/13/2020 Document Reviewed: 06/13/2020 Elsevier Patient Education  2024 Elsevier Inc.    Emil Schaumann, MD [facility]

## 2023-12-09 NOTE — Assessment & Plan Note (Signed)
 As a result of nontraumatic hemorrhagic stroke Stable.  No concerns.

## 2023-12-09 NOTE — Patient Instructions (Signed)
 Health Maintenance After Age 65 After age 27, you are at a higher risk for certain long-term diseases and infections as well as injuries from falls. Falls are a major cause of broken bones and head injuries in people who are older than age 73. Getting regular preventive care can help to keep you healthy and well. Preventive care includes getting regular testing and making lifestyle changes as recommended by your health care provider. Talk with your health care provider about: Which screenings and tests you should have. A screening is a test that checks for a disease when you have no symptoms. A diet and exercise plan that is right for you. What should I know about screenings and tests to prevent falls? Screening and testing are the best ways to find a health problem early. Early diagnosis and treatment give you the best chance of managing medical conditions that are common after age 90. Certain conditions and lifestyle choices may make you more likely to have a fall. Your health care provider may recommend: Regular vision checks. Poor vision and conditions such as cataracts can make you more likely to have a fall. If you wear glasses, make sure to get your prescription updated if your vision changes. Medicine review. Work with your health care provider to regularly review all of the medicines you are taking, including over-the-counter medicines. Ask your health care provider about any side effects that may make you more likely to have a fall. Tell your health care provider if any medicines that you take make you feel dizzy or sleepy. Strength and balance checks. Your health care provider may recommend certain tests to check your strength and balance while standing, walking, or changing positions. Foot health exam. Foot pain and numbness, as well as not wearing proper footwear, can make you more likely to have a fall. Screenings, including: Osteoporosis screening. Osteoporosis is a condition that causes  the bones to get weaker and break more easily. Blood pressure screening. Blood pressure changes and medicines to control blood pressure can make you feel dizzy. Depression screening. You may be more likely to have a fall if you have a fear of falling, feel depressed, or feel unable to do activities that you used to do. Alcohol  use screening. Using too much alcohol  can affect your balance and may make you more likely to have a fall. Follow these instructions at home: Lifestyle Do not drink alcohol  if: Your health care provider tells you not to drink. If you drink alcohol : Limit how much you have to: 0-1 drink a day for women. 0-2 drinks a day for men. Know how much alcohol  is in your drink. In the [place] one drink equals one 12 oz bottle of beer (355 mL), one 5 oz glass of wine (148 mL), or one 1 oz glass of hard liquor (44 mL). Do not use any products that contain nicotine or tobacco. These products include cigarettes, chewing tobacco, and vaping devices, such as e-cigarettes. If you need help quitting, ask your health care provider. Activity  Follow a regular exercise program to stay fit. This will help you maintain your balance. Ask your health care provider what types of exercise are appropriate for you. If you need a cane or walker, use it as recommended by your health care provider. Wear supportive shoes that have nonskid soles. Safety  Remove any tripping hazards, such as rugs, cords, and clutter. Install safety equipment such as grab bars in bathrooms and safety rails on stairs. Keep rooms and walkways  well-lit. General instructions Talk with your health care provider about your risks for falling. Tell your health care provider if: You fall. Be sure to tell your health care provider about all falls, even ones that seem minor. You feel dizzy, tiredness (fatigue), or off-balance. Take over-the-counter and prescription medicines only as told by your health care provider. These include  supplements. Eat a healthy diet and maintain a healthy weight. A healthy diet includes low-fat dairy products, low-fat (lean) meats, and fiber from whole grains, beans, and lots of fruits and vegetables. Stay current with your vaccines. Schedule regular health, dental, and eye exams. Summary Having a healthy lifestyle and getting preventive care can help to protect your health and wellness after age 15. Screening and testing are the best way to find a health problem early and help you avoid having a fall. Early diagnosis and treatment give you the best chance for managing medical conditions that are more common for people who are older than age 42. Falls are a major cause of broken bones and head injuries in people who are older than age 64. Take precautions to prevent a fall at home. Work with your health care provider to learn what changes you can make to improve your health and wellness and to prevent falls. This information is not intended to replace advice given to you by your health care provider. Make sure you discuss any questions you have with your health care provider. Document Revised: 06/13/2020 Document Reviewed: 06/13/2020 Elsevier Patient Education  2024 [employer].

## 2023-12-09 NOTE — Assessment & Plan Note (Signed)
 Diet and nutrition discussed We will follow-up in 6 months

## 2023-12-09 NOTE — Assessment & Plan Note (Signed)
 BP Readings from Last 3 Encounters:  12/09/23 134/84  06/10/23 122/88  12/11/22 130/74  Normotensive.  Well-controlled hypertension Continue amlodipine  10 mg daily and losartan  50 mg daily Cardiovascular risks associated with hypertension discussed Dietary approaches to stop hypertension discussed

## 2023-12-09 NOTE — Assessment & Plan Note (Signed)
 Chronic stable condition Advised to stay well-hydrated and avoid NSAIDs as much as possible Continue Farxiga  10 mg daily

## 2023-12-09 NOTE — Assessment & Plan Note (Signed)
 Diet and nutrition discussed. Continue atorvastatin 40 mg daily.

## 2023-12-09 NOTE — Assessment & Plan Note (Signed)
 Mostly left knee and left ankle Recommend x-rays today.  Will review images when ready Pain management discussed Recommend Tylenol  Recommend to avoid NSAIDs is much as possible

## 2023-12-09 NOTE — Assessment & Plan Note (Signed)
 Secondary stroke prevention measures discussed Well-controlled hypertension On blood pressure and cholesterol medication Lipid profile done today Non-smoker Prediabetic

## 2024-06-08 ENCOUNTER — Ambulatory Visit: Payer: Self-pay | Admitting: Emergency Medicine
